# Patient Record
Sex: Female | Born: 1950
Health system: Southern US, Community
[De-identification: ages and names within clinical notes are randomized; demographics above are authoritative.]

## PROBLEM LIST (undated history)

## (undated) DIAGNOSIS — M1990 Unspecified osteoarthritis, unspecified site: Secondary | ICD-10-CM

## (undated) DIAGNOSIS — R112 Nausea with vomiting, unspecified: Secondary | ICD-10-CM

## (undated) DIAGNOSIS — Z87442 Personal history of urinary calculi: Secondary | ICD-10-CM

## (undated) DIAGNOSIS — E669 Obesity, unspecified: Secondary | ICD-10-CM

## (undated) DIAGNOSIS — R7303 Prediabetes: Secondary | ICD-10-CM

## (undated) DIAGNOSIS — F341 Dysthymic disorder: Secondary | ICD-10-CM

## (undated) DIAGNOSIS — E041 Nontoxic single thyroid nodule: Secondary | ICD-10-CM

## (undated) DIAGNOSIS — E559 Vitamin D deficiency, unspecified: Secondary | ICD-10-CM

## (undated) DIAGNOSIS — J310 Chronic rhinitis: Secondary | ICD-10-CM

## (undated) DIAGNOSIS — D509 Iron deficiency anemia, unspecified: Secondary | ICD-10-CM

## (undated) DIAGNOSIS — F32A Depression, unspecified: Secondary | ICD-10-CM

## (undated) DIAGNOSIS — M8580 Other specified disorders of bone density and structure, unspecified site: Secondary | ICD-10-CM

## (undated) DIAGNOSIS — R739 Hyperglycemia, unspecified: Secondary | ICD-10-CM

## (undated) DIAGNOSIS — E785 Hyperlipidemia, unspecified: Secondary | ICD-10-CM

## (undated) DIAGNOSIS — Z9889 Other specified postprocedural states: Secondary | ICD-10-CM

## (undated) DIAGNOSIS — F419 Anxiety disorder, unspecified: Secondary | ICD-10-CM

## (undated) DIAGNOSIS — E7800 Pure hypercholesterolemia, unspecified: Secondary | ICD-10-CM

## (undated) HISTORY — PX: BACK SURGERY: SHX140

## (undated) HISTORY — DX: Nontoxic single thyroid nodule: E04.1

## (undated) HISTORY — PX: OTHER SURGICAL HISTORY: SHX169

## (undated) HISTORY — DX: Prediabetes: R73.03

## (undated) HISTORY — PX: BREAST EXCISIONAL BIOPSY: SUR124

## (undated) HISTORY — PX: ABDOMINAL HYSTERECTOMY: SHX81

## (undated) HISTORY — PX: COLONOSCOPY: SHX174

## (undated) HISTORY — DX: Other forms of dyspnea: R06.09

## (undated) HISTORY — DX: Iron deficiency anemia, unspecified: D50.9

## (undated) HISTORY — PX: EYE SURGERY: SHX253

## (undated) HISTORY — DX: Chronic rhinitis: J31.0

## (undated) HISTORY — DX: Obesity, unspecified: E66.9

## (undated) HISTORY — DX: Hyperglycemia, unspecified: R73.9

## (undated) HISTORY — DX: Vitamin D deficiency, unspecified: E55.9

## (undated) HISTORY — PX: APPENDECTOMY: SHX54

## (undated) HISTORY — DX: Essential (primary) hypertension: I10

## (undated) HISTORY — DX: Dysthymic disorder: F34.1

## (undated) HISTORY — DX: Unspecified osteoarthritis, unspecified site: M19.90

## (undated) HISTORY — PX: CARPAL TUNNEL RELEASE: SHX101

## (undated) HISTORY — DX: Pure hypercholesterolemia, unspecified: E78.00

---

## 1997-08-07 ENCOUNTER — Encounter: Admission: RE | Admit: 1997-08-07 | Discharge: 1997-08-07 | Payer: Self-pay | Admitting: Family Medicine

## 1997-09-15 ENCOUNTER — Encounter: Admission: RE | Admit: 1997-09-15 | Discharge: 1997-09-15 | Payer: Self-pay | Admitting: Family Medicine

## 1997-10-26 ENCOUNTER — Encounter: Admission: RE | Admit: 1997-10-26 | Discharge: 1997-10-26 | Payer: Self-pay | Admitting: Family Medicine

## 1997-10-31 ENCOUNTER — Encounter: Admission: RE | Admit: 1997-10-31 | Discharge: 1997-10-31 | Payer: Self-pay | Admitting: Family Medicine

## 1997-11-23 ENCOUNTER — Encounter: Admission: RE | Admit: 1997-11-23 | Discharge: 1997-11-23 | Payer: Self-pay | Admitting: Family Medicine

## 1997-12-25 ENCOUNTER — Encounter: Admission: RE | Admit: 1997-12-25 | Discharge: 1997-12-25 | Payer: Self-pay | Admitting: Family Medicine

## 1998-01-11 ENCOUNTER — Encounter: Admission: RE | Admit: 1998-01-11 | Discharge: 1998-01-11 | Payer: Self-pay | Admitting: Family Medicine

## 1998-02-14 ENCOUNTER — Encounter: Admission: RE | Admit: 1998-02-14 | Discharge: 1998-02-14 | Payer: Self-pay | Admitting: Family Medicine

## 1998-03-13 ENCOUNTER — Encounter: Admission: RE | Admit: 1998-03-13 | Discharge: 1998-03-13 | Payer: Self-pay | Admitting: Family Medicine

## 1998-03-22 ENCOUNTER — Encounter: Admission: RE | Admit: 1998-03-22 | Discharge: 1998-03-22 | Payer: Self-pay | Admitting: Family Medicine

## 1998-04-06 ENCOUNTER — Encounter: Admission: RE | Admit: 1998-04-06 | Discharge: 1998-04-06 | Payer: Self-pay | Admitting: Family Medicine

## 1998-04-10 ENCOUNTER — Emergency Department (HOSPITAL_COMMUNITY): Admission: EM | Admit: 1998-04-10 | Discharge: 1998-04-10 | Payer: Self-pay | Admitting: Emergency Medicine

## 1998-04-10 ENCOUNTER — Encounter: Payer: Self-pay | Admitting: Emergency Medicine

## 1998-05-08 ENCOUNTER — Encounter: Admission: RE | Admit: 1998-05-08 | Discharge: 1998-05-08 | Payer: Self-pay | Admitting: Sports Medicine

## 1998-05-15 ENCOUNTER — Encounter: Payer: Self-pay | Admitting: Neurosurgery

## 1998-05-15 ENCOUNTER — Observation Stay (HOSPITAL_COMMUNITY): Admission: RE | Admit: 1998-05-15 | Discharge: 1998-05-16 | Payer: Self-pay | Admitting: Neurosurgery

## 1998-06-06 ENCOUNTER — Encounter: Payer: Self-pay | Admitting: Neurosurgery

## 1998-06-06 ENCOUNTER — Ambulatory Visit (HOSPITAL_COMMUNITY): Admission: RE | Admit: 1998-06-06 | Discharge: 1998-06-06 | Payer: Self-pay | Admitting: Neurosurgery

## 1998-06-18 ENCOUNTER — Encounter: Admission: RE | Admit: 1998-06-18 | Discharge: 1998-06-18 | Payer: Self-pay | Admitting: Sports Medicine

## 1998-07-02 ENCOUNTER — Encounter: Payer: Self-pay | Admitting: Neurosurgery

## 1998-07-02 ENCOUNTER — Ambulatory Visit (HOSPITAL_COMMUNITY): Admission: RE | Admit: 1998-07-02 | Discharge: 1998-07-02 | Payer: Self-pay | Admitting: Neurosurgery

## 1998-07-20 ENCOUNTER — Encounter: Admission: RE | Admit: 1998-07-20 | Discharge: 1998-07-20 | Payer: Self-pay | Admitting: Family Medicine

## 1998-08-28 ENCOUNTER — Encounter: Admission: RE | Admit: 1998-08-28 | Discharge: 1998-08-28 | Payer: Self-pay | Admitting: Sports Medicine

## 1998-10-12 ENCOUNTER — Encounter: Admission: RE | Admit: 1998-10-12 | Discharge: 1998-10-12 | Payer: Self-pay | Admitting: Family Medicine

## 1998-11-15 ENCOUNTER — Encounter: Admission: RE | Admit: 1998-11-15 | Discharge: 1998-11-15 | Payer: Self-pay | Admitting: Family Medicine

## 1998-12-17 ENCOUNTER — Encounter: Admission: RE | Admit: 1998-12-17 | Discharge: 1998-12-17 | Payer: Self-pay | Admitting: Family Medicine

## 1999-01-22 ENCOUNTER — Encounter: Admission: RE | Admit: 1999-01-22 | Discharge: 1999-01-22 | Payer: Self-pay | Admitting: Sports Medicine

## 1999-02-15 ENCOUNTER — Encounter: Admission: RE | Admit: 1999-02-15 | Discharge: 1999-02-15 | Payer: Self-pay | Admitting: Family Medicine

## 1999-03-05 ENCOUNTER — Encounter: Admission: RE | Admit: 1999-03-05 | Discharge: 1999-03-05 | Payer: Self-pay | Admitting: Sports Medicine

## 1999-04-02 ENCOUNTER — Encounter: Admission: RE | Admit: 1999-04-02 | Discharge: 1999-04-02 | Payer: Self-pay | Admitting: Sports Medicine

## 1999-04-23 ENCOUNTER — Encounter: Admission: RE | Admit: 1999-04-23 | Discharge: 1999-04-23 | Payer: Self-pay | Admitting: Sports Medicine

## 1999-05-24 ENCOUNTER — Encounter: Admission: RE | Admit: 1999-05-24 | Discharge: 1999-05-24 | Payer: Self-pay | Admitting: Family Medicine

## 1999-06-06 ENCOUNTER — Encounter: Admission: RE | Admit: 1999-06-06 | Discharge: 1999-06-06 | Payer: Self-pay | Admitting: Family Medicine

## 1999-06-28 ENCOUNTER — Encounter: Admission: RE | Admit: 1999-06-28 | Discharge: 1999-06-28 | Payer: Self-pay | Admitting: Family Medicine

## 1999-07-11 ENCOUNTER — Encounter: Admission: RE | Admit: 1999-07-11 | Discharge: 1999-07-11 | Payer: Self-pay | Admitting: Family Medicine

## 1999-08-06 ENCOUNTER — Ambulatory Visit (HOSPITAL_COMMUNITY): Admission: RE | Admit: 1999-08-06 | Discharge: 1999-08-06 | Payer: Self-pay | Admitting: Family Medicine

## 1999-08-06 ENCOUNTER — Encounter: Admission: RE | Admit: 1999-08-06 | Discharge: 1999-08-06 | Payer: Self-pay | Admitting: Sports Medicine

## 1999-08-20 ENCOUNTER — Encounter: Admission: RE | Admit: 1999-08-20 | Discharge: 1999-08-20 | Payer: Self-pay | Admitting: Family Medicine

## 1999-09-05 ENCOUNTER — Encounter: Admission: RE | Admit: 1999-09-05 | Discharge: 1999-09-05 | Payer: Self-pay | Admitting: Family Medicine

## 1999-09-18 ENCOUNTER — Encounter: Admission: RE | Admit: 1999-09-18 | Discharge: 1999-09-18 | Payer: Self-pay | Admitting: Family Medicine

## 1999-10-02 ENCOUNTER — Encounter: Admission: RE | Admit: 1999-10-02 | Discharge: 1999-10-02 | Payer: Self-pay | Admitting: Family Medicine

## 1999-10-24 ENCOUNTER — Encounter: Admission: RE | Admit: 1999-10-24 | Discharge: 1999-10-24 | Payer: Self-pay | Admitting: Family Medicine

## 1999-11-12 ENCOUNTER — Encounter: Admission: RE | Admit: 1999-11-12 | Discharge: 1999-11-12 | Payer: Self-pay | Admitting: Family Medicine

## 1999-11-14 ENCOUNTER — Encounter: Payer: Self-pay | Admitting: Family Medicine

## 1999-11-14 ENCOUNTER — Encounter: Admission: RE | Admit: 1999-11-14 | Discharge: 1999-11-14 | Payer: Self-pay | Admitting: *Deleted

## 1999-11-21 ENCOUNTER — Encounter: Admission: RE | Admit: 1999-11-21 | Discharge: 1999-11-21 | Payer: Self-pay | Admitting: Family Medicine

## 1999-12-04 ENCOUNTER — Encounter: Admission: RE | Admit: 1999-12-04 | Discharge: 1999-12-04 | Payer: Self-pay | Admitting: *Deleted

## 1999-12-04 ENCOUNTER — Encounter: Payer: Self-pay | Admitting: Family Medicine

## 1999-12-17 ENCOUNTER — Encounter: Admission: RE | Admit: 1999-12-17 | Discharge: 1999-12-17 | Payer: Self-pay | Admitting: Family Medicine

## 2000-01-03 ENCOUNTER — Encounter: Admission: RE | Admit: 2000-01-03 | Discharge: 2000-01-03 | Payer: Self-pay | Admitting: Family Medicine

## 2000-01-27 ENCOUNTER — Encounter: Admission: RE | Admit: 2000-01-27 | Discharge: 2000-01-27 | Payer: Self-pay | Admitting: Family Medicine

## 2000-02-26 ENCOUNTER — Encounter: Admission: RE | Admit: 2000-02-26 | Discharge: 2000-02-26 | Payer: Self-pay | Admitting: Family Medicine

## 2000-03-25 ENCOUNTER — Encounter: Admission: RE | Admit: 2000-03-25 | Discharge: 2000-03-25 | Payer: Self-pay | Admitting: Family Medicine

## 2000-04-24 ENCOUNTER — Encounter: Admission: RE | Admit: 2000-04-24 | Discharge: 2000-04-24 | Payer: Self-pay | Admitting: Family Medicine

## 2000-08-24 ENCOUNTER — Encounter: Admission: RE | Admit: 2000-08-24 | Discharge: 2000-08-24 | Payer: Self-pay | Admitting: Family Medicine

## 2000-09-30 ENCOUNTER — Encounter: Admission: RE | Admit: 2000-09-30 | Discharge: 2000-09-30 | Payer: Self-pay | Admitting: Family Medicine

## 2000-11-02 ENCOUNTER — Encounter: Admission: RE | Admit: 2000-11-02 | Discharge: 2000-11-02 | Payer: Self-pay | Admitting: Family Medicine

## 2000-12-03 ENCOUNTER — Encounter: Admission: RE | Admit: 2000-12-03 | Discharge: 2000-12-03 | Payer: Self-pay | Admitting: Family Medicine

## 2001-04-16 ENCOUNTER — Encounter: Admission: RE | Admit: 2001-04-16 | Discharge: 2001-04-16 | Payer: Self-pay | Admitting: Family Medicine

## 2001-05-07 ENCOUNTER — Encounter: Admission: RE | Admit: 2001-05-07 | Discharge: 2001-05-07 | Payer: Self-pay | Admitting: Family Medicine

## 2001-05-12 ENCOUNTER — Encounter: Admission: RE | Admit: 2001-05-12 | Discharge: 2001-05-12 | Payer: Self-pay | Admitting: Sports Medicine

## 2001-05-12 ENCOUNTER — Encounter: Payer: Self-pay | Admitting: Sports Medicine

## 2001-05-21 ENCOUNTER — Encounter: Admission: RE | Admit: 2001-05-21 | Discharge: 2001-05-21 | Payer: Self-pay | Admitting: Family Medicine

## 2001-12-07 ENCOUNTER — Encounter: Admission: RE | Admit: 2001-12-07 | Discharge: 2001-12-07 | Payer: Self-pay | Admitting: Family Medicine

## 2002-01-06 ENCOUNTER — Encounter: Admission: RE | Admit: 2002-01-06 | Discharge: 2002-01-06 | Payer: Self-pay | Admitting: Family Medicine

## 2002-02-15 ENCOUNTER — Encounter: Admission: RE | Admit: 2002-02-15 | Discharge: 2002-02-15 | Payer: Self-pay | Admitting: Family Medicine

## 2002-07-04 ENCOUNTER — Encounter: Admission: RE | Admit: 2002-07-04 | Discharge: 2002-07-04 | Payer: Self-pay | Admitting: Family Medicine

## 2002-07-06 ENCOUNTER — Encounter: Payer: Self-pay | Admitting: *Deleted

## 2002-07-06 ENCOUNTER — Ambulatory Visit (HOSPITAL_COMMUNITY): Admission: RE | Admit: 2002-07-06 | Discharge: 2002-07-06 | Payer: Self-pay | Admitting: *Deleted

## 2002-08-01 ENCOUNTER — Encounter: Admission: RE | Admit: 2002-08-01 | Discharge: 2002-08-01 | Payer: Self-pay | Admitting: Sports Medicine

## 2002-08-01 ENCOUNTER — Encounter: Payer: Self-pay | Admitting: Sports Medicine

## 2002-08-17 ENCOUNTER — Encounter: Admission: RE | Admit: 2002-08-17 | Discharge: 2002-08-17 | Payer: Self-pay | Admitting: Neurosurgery

## 2002-08-17 ENCOUNTER — Encounter: Payer: Self-pay | Admitting: Neurosurgery

## 2002-09-02 ENCOUNTER — Encounter: Payer: Self-pay | Admitting: Neurosurgery

## 2002-09-02 ENCOUNTER — Encounter: Admission: RE | Admit: 2002-09-02 | Discharge: 2002-09-02 | Payer: Self-pay | Admitting: Neurosurgery

## 2002-12-19 ENCOUNTER — Encounter: Admission: RE | Admit: 2002-12-19 | Discharge: 2002-12-19 | Payer: Self-pay | Admitting: Sports Medicine

## 2003-04-06 ENCOUNTER — Other Ambulatory Visit: Admission: RE | Admit: 2003-04-06 | Discharge: 2003-04-06 | Payer: Self-pay | Admitting: Obstetrics and Gynecology

## 2003-10-05 ENCOUNTER — Encounter: Admission: RE | Admit: 2003-10-05 | Discharge: 2003-10-05 | Payer: Self-pay | Admitting: Family Medicine

## 2004-01-22 ENCOUNTER — Encounter: Admission: RE | Admit: 2004-01-22 | Discharge: 2004-01-22 | Payer: Self-pay | Admitting: Sports Medicine

## 2004-02-09 ENCOUNTER — Ambulatory Visit: Payer: Self-pay | Admitting: Family Medicine

## 2004-04-11 ENCOUNTER — Ambulatory Visit: Payer: Self-pay | Admitting: Family Medicine

## 2004-04-11 ENCOUNTER — Encounter: Admission: RE | Admit: 2004-04-11 | Discharge: 2004-04-11 | Payer: Self-pay | Admitting: Sports Medicine

## 2004-04-11 IMAGING — CR DG FOOT COMPLETE 3+V*R*
3 series · 3 of 3 positions shown · non-contrast
Comparison: none

CLINICAL DATA: Bump on top of feet, left greater than right. 
 LEFT FOOT:
 Three views of the left foot were obtained.  There is degenerative change, particularly at the articulation of the first cuneiform and first metatarsal.  There is slight loss of joint space with sclerosis and mild eburnation which creates a slight bony protrusion on the dorsal aspect of that level.  No other acute abnormality is seen.

[view not recorded (1 of 3)]
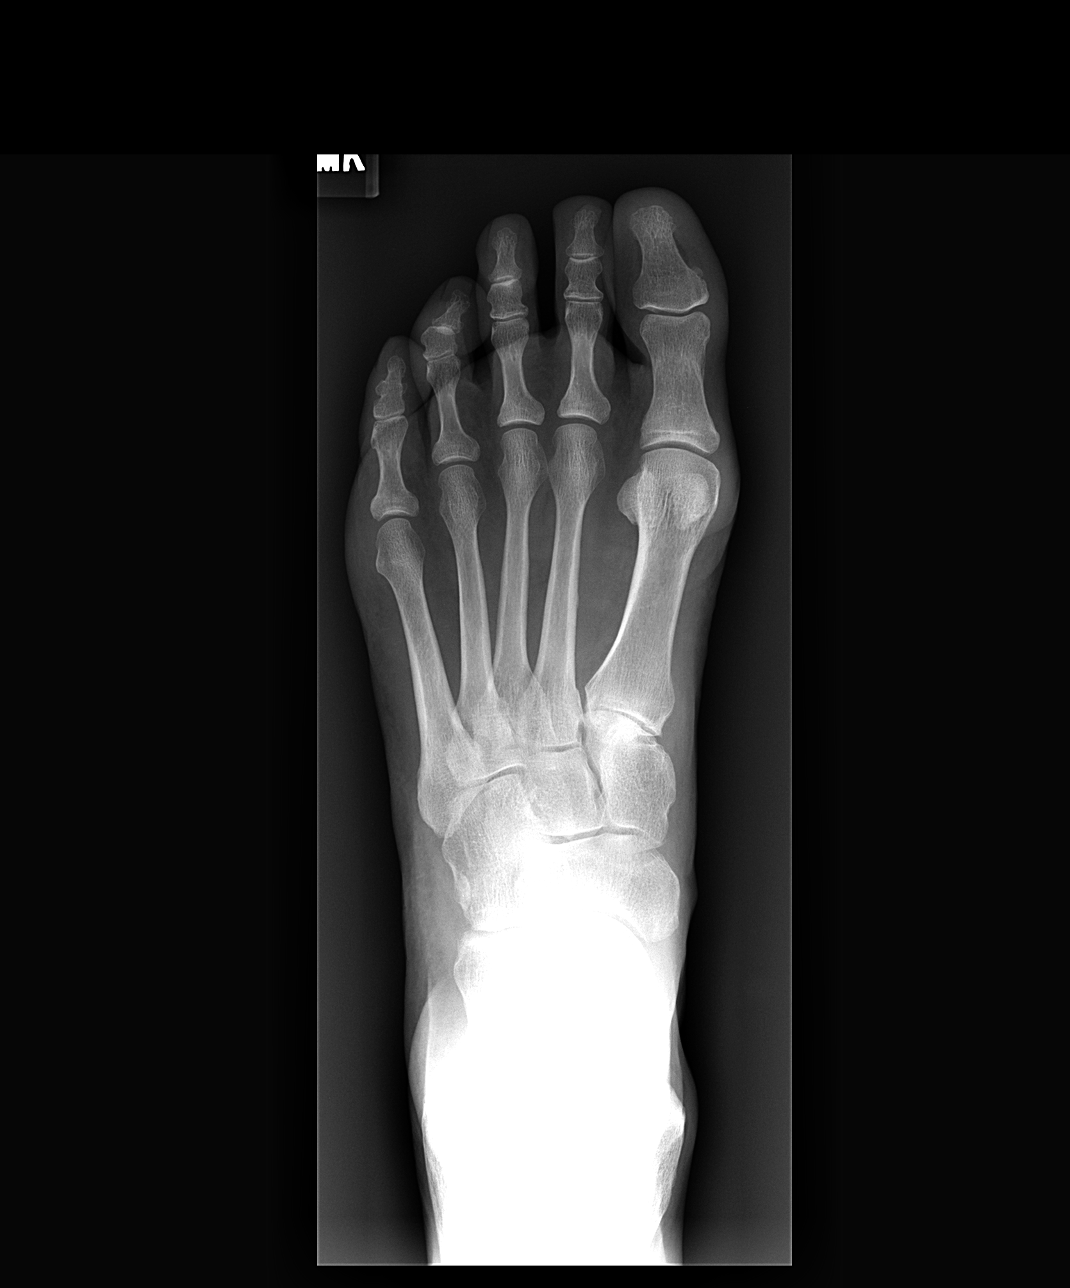

[view not recorded (2 of 3)]
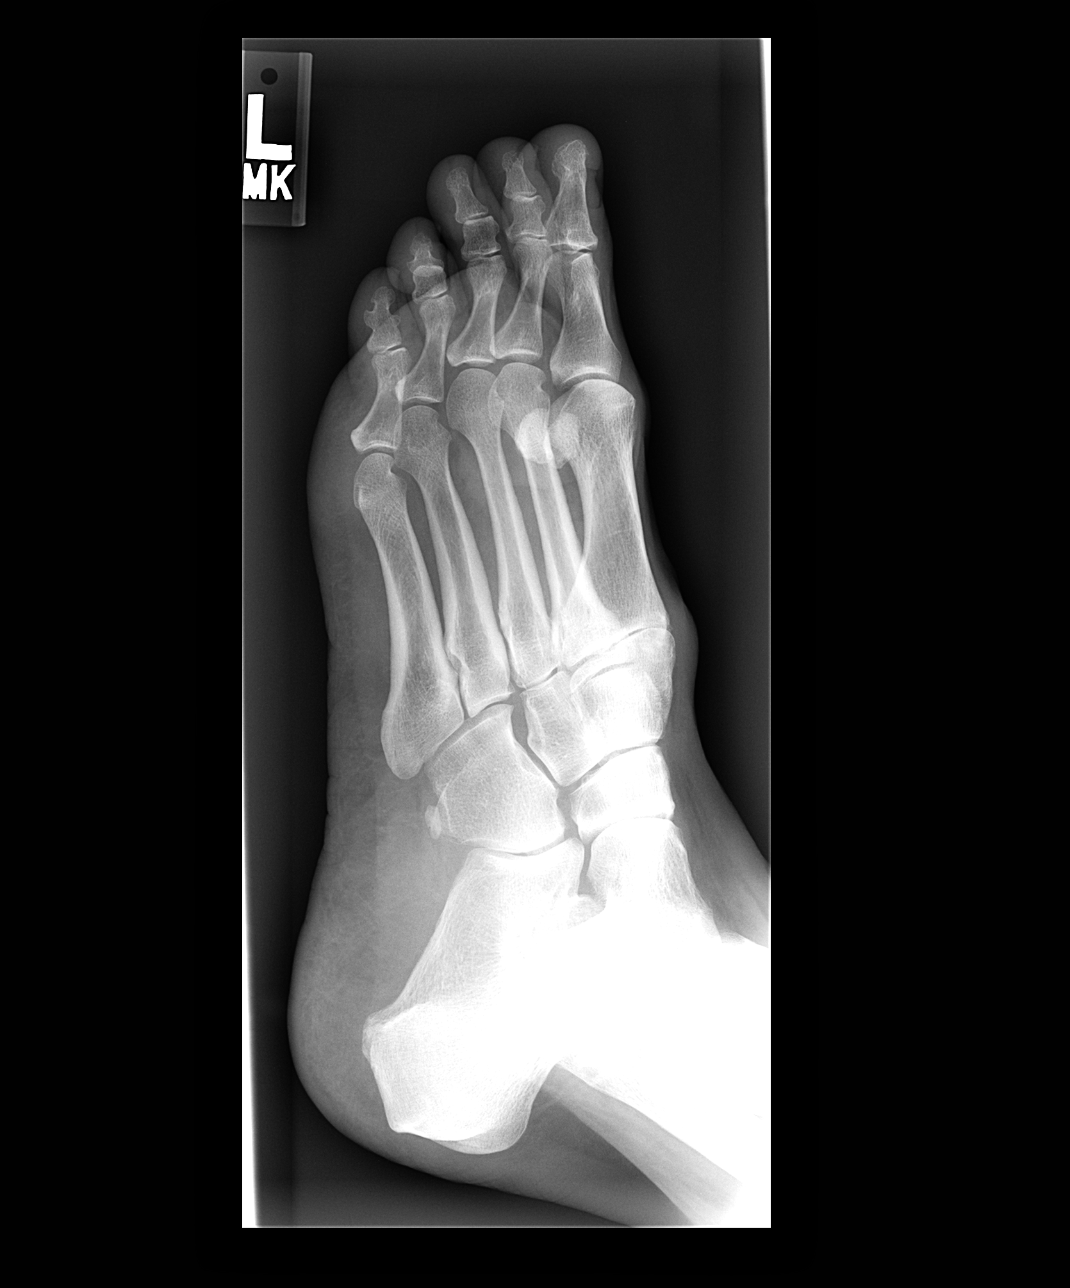

[view not recorded (3 of 3)]
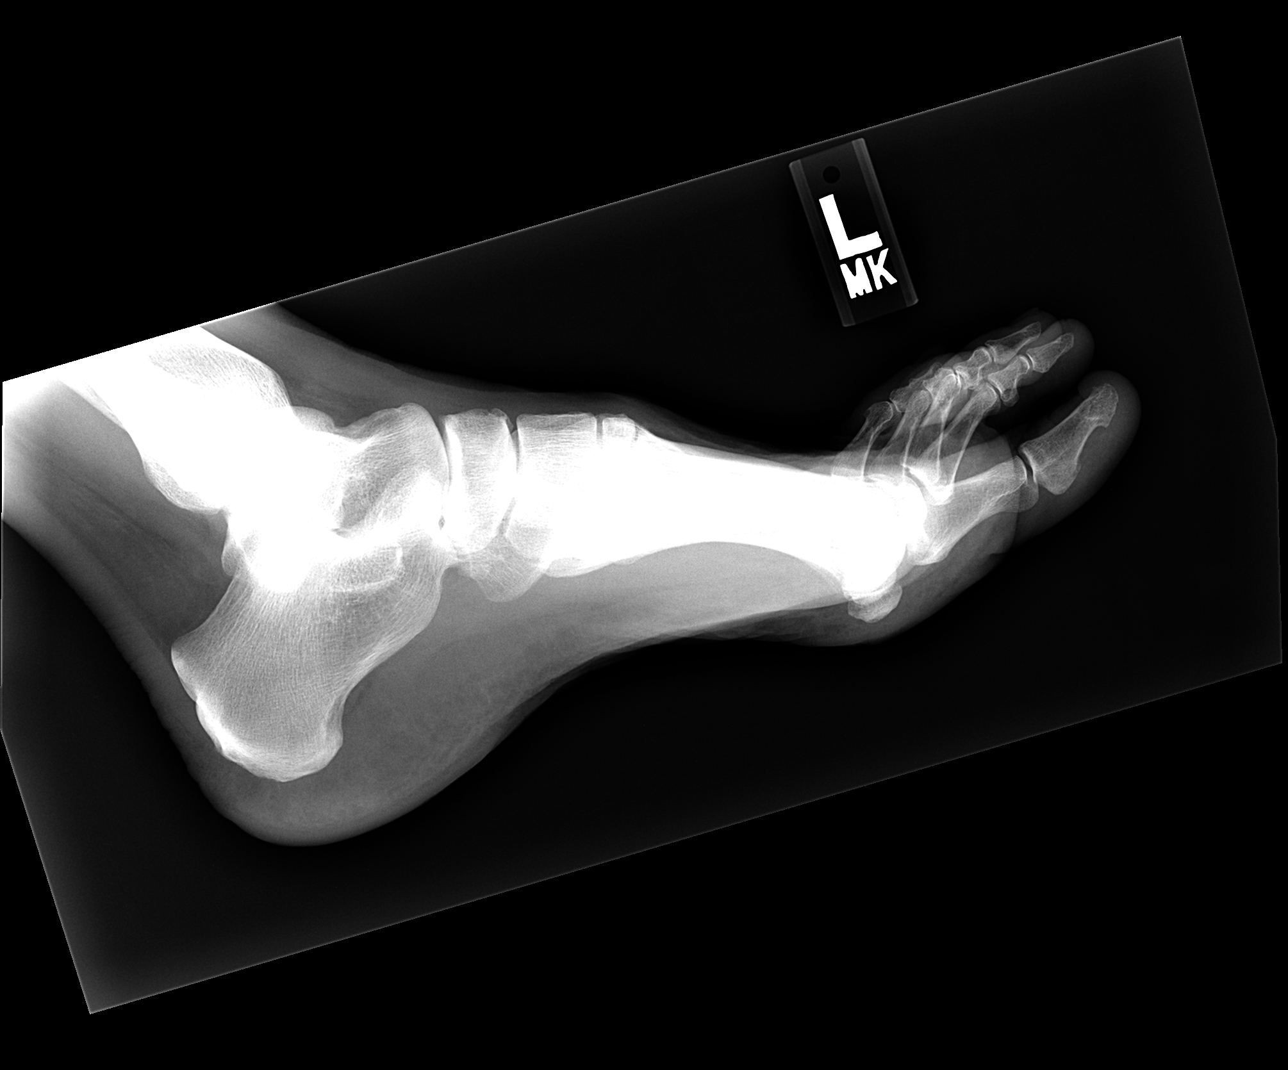

[3 of 3 positions shown; findings below may reference images not displayed]

IMPRESSION: The protrusion on the dorsal aspect of the left foot appears to be due to degenerative at the first cuneiform-first metatarsal articulation.
 RIGHT FOOT:
 Three views of the right foot show no acute abnormality.  There is less degenerative change at the articulation of the first cuneiform and first metatarsal than is seen on the left foot.
IMPRESSION: Negative right foot.  Only minimal degenerative change is present at the first cuneiform-first metatarsal articulation.

## 2004-04-11 IMAGING — CR DG FOOT COMPLETE 3+V*R*
3 series · 3 of 3 positions shown · non-contrast
Comparison: none

CLINICAL DATA: Bump on top of feet, left greater than right. 
 LEFT FOOT:
 Three views of the left foot were obtained.  There is degenerative change, particularly at the articulation of the first cuneiform and first metatarsal.  There is slight loss of joint space with sclerosis and mild eburnation which creates a slight bony protrusion on the dorsal aspect of that level.  No other acute abnormality is seen.

[view not recorded (1 of 3)]
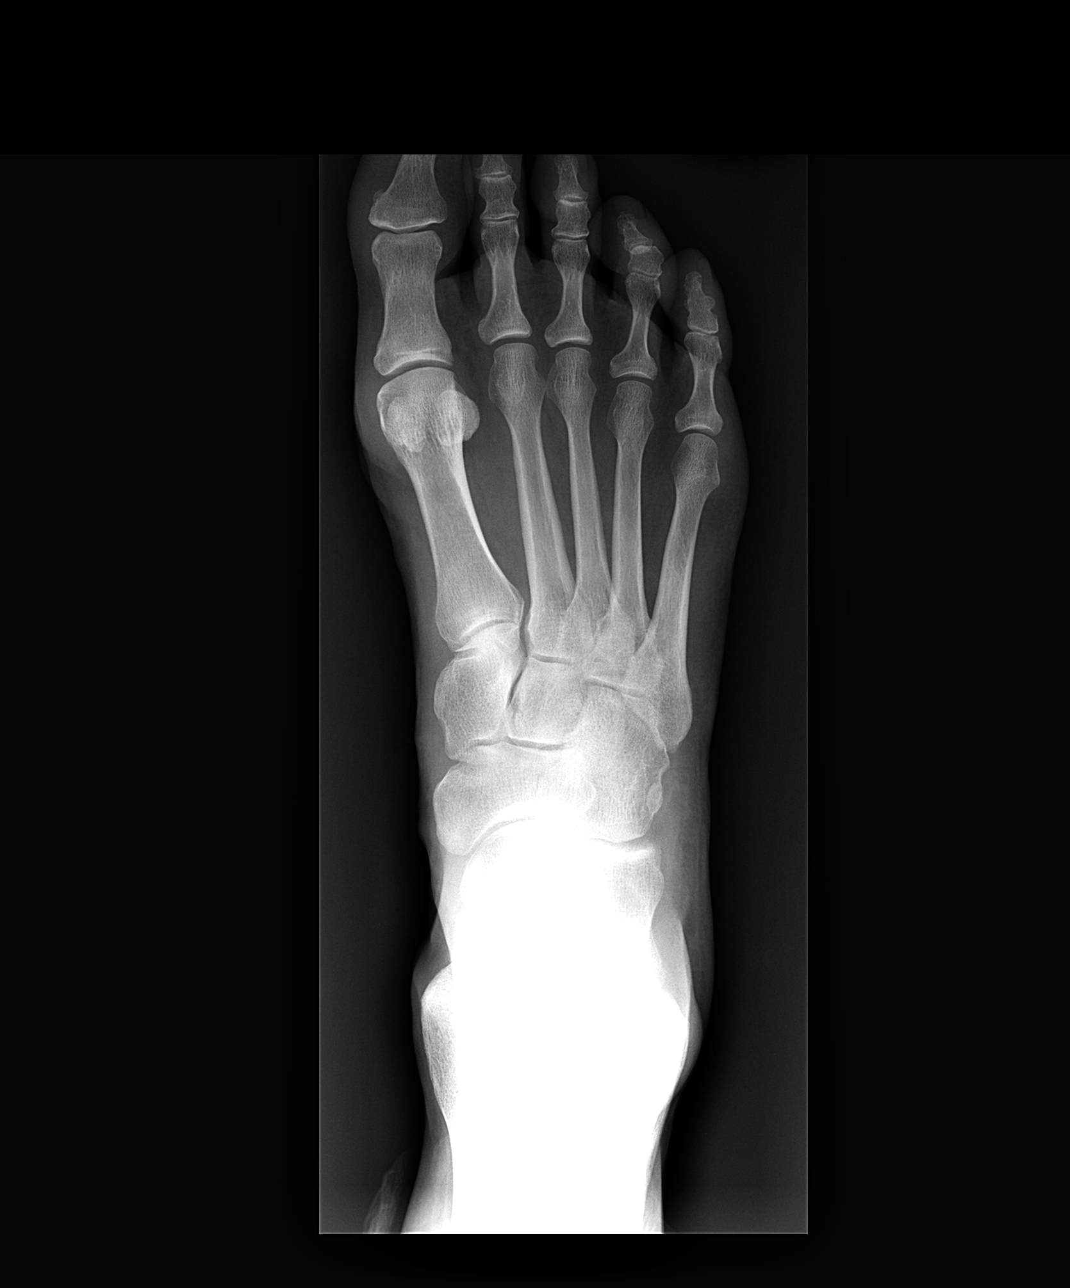

[view not recorded (2 of 3)]
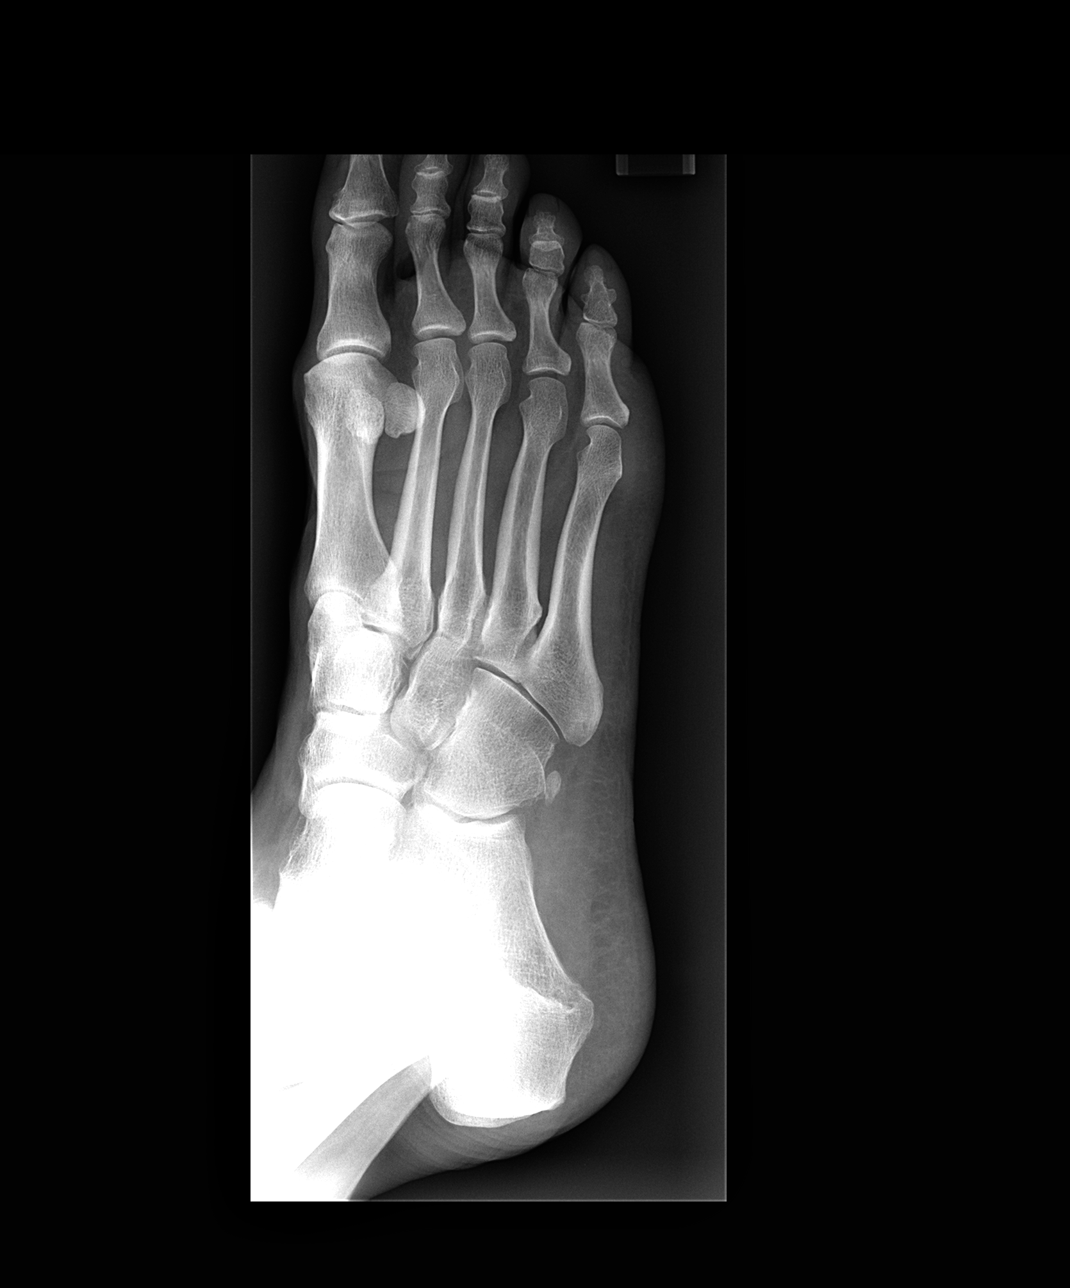

[view not recorded (3 of 3)]
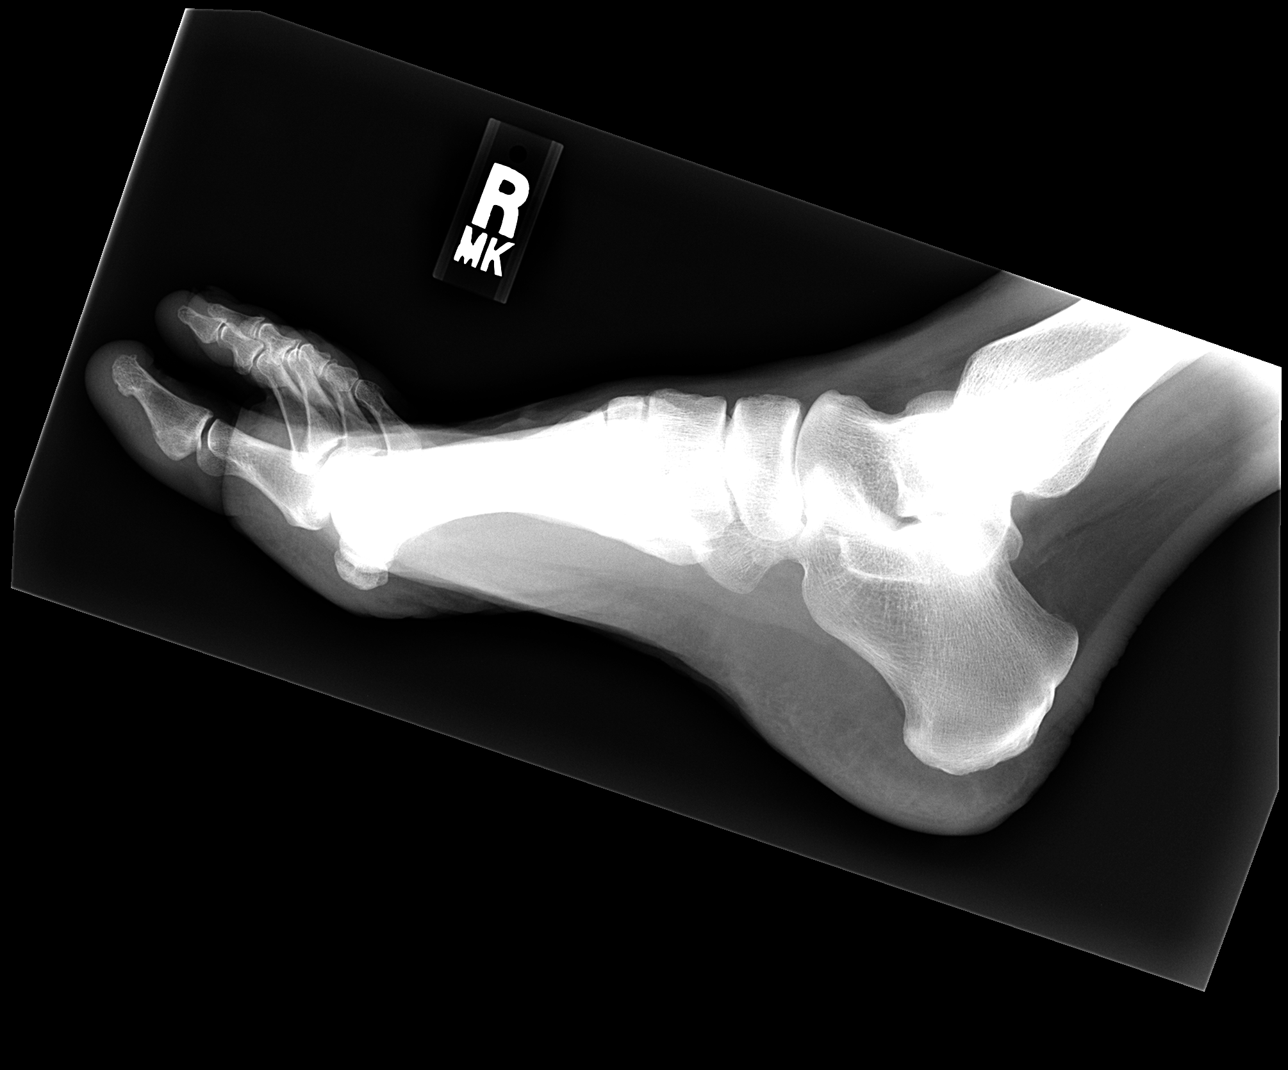

[3 of 3 positions shown; findings below may reference images not displayed]

IMPRESSION: The protrusion on the dorsal aspect of the left foot appears to be due to degenerative at the first cuneiform-first metatarsal articulation.
 RIGHT FOOT:
 Three views of the right foot show no acute abnormality.  There is less degenerative change at the articulation of the first cuneiform and first metatarsal than is seen on the left foot.
IMPRESSION: Negative right foot.  Only minimal degenerative change is present at the first cuneiform-first metatarsal articulation.

## 2004-05-01 ENCOUNTER — Encounter: Admission: RE | Admit: 2004-05-01 | Discharge: 2004-05-01 | Payer: Self-pay | Admitting: Obstetrics and Gynecology

## 2004-09-13 ENCOUNTER — Ambulatory Visit (HOSPITAL_COMMUNITY): Admission: RE | Admit: 2004-09-13 | Discharge: 2004-09-13 | Payer: Self-pay | Admitting: Neurosurgery

## 2004-10-03 ENCOUNTER — Ambulatory Visit (HOSPITAL_COMMUNITY): Admission: RE | Admit: 2004-10-03 | Discharge: 2004-10-03 | Payer: Self-pay | Admitting: Neurosurgery

## 2004-10-03 ENCOUNTER — Ambulatory Visit (HOSPITAL_BASED_OUTPATIENT_CLINIC_OR_DEPARTMENT_OTHER): Admission: RE | Admit: 2004-10-03 | Discharge: 2004-10-03 | Payer: Self-pay | Admitting: Neurosurgery

## 2004-10-24 ENCOUNTER — Ambulatory Visit: Payer: Self-pay | Admitting: Family Medicine

## 2004-10-25 ENCOUNTER — Encounter: Admission: RE | Admit: 2004-10-25 | Discharge: 2004-10-25 | Payer: Self-pay | Admitting: Sports Medicine

## 2004-10-25 IMAGING — CR DG HAND COMPLETE 3+V*L*
3 series · 3 of 3 positions shown · non-contrast
Comparison: none

CLINICAL DATA: Bilateral hand swelling especially PIP joints.  Recent carpal tunnel surgery. 
COMPLETE LEFT HAND THREE VIEWS: 
No significant osseous, articular is seen.  Slight soft tissue swelling is seen at the proximal digits and at the metacarpal phalangeal joint level.

[x hand pa left]
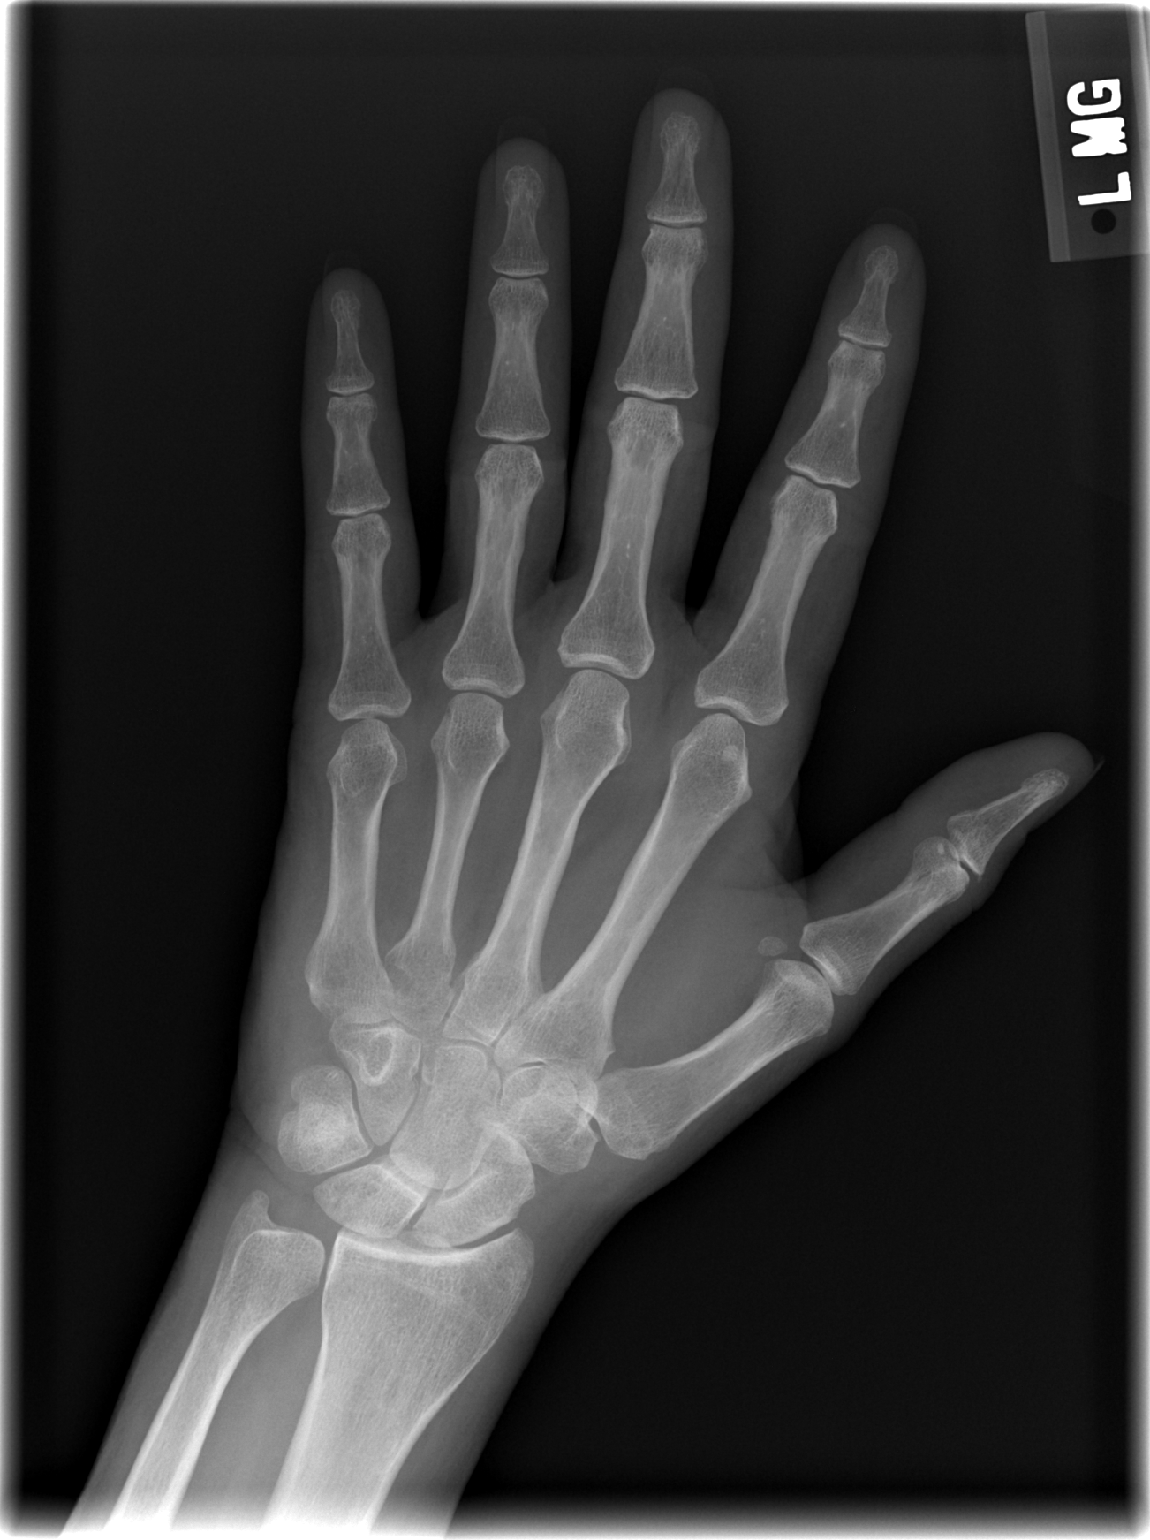

[x hand oblique left]
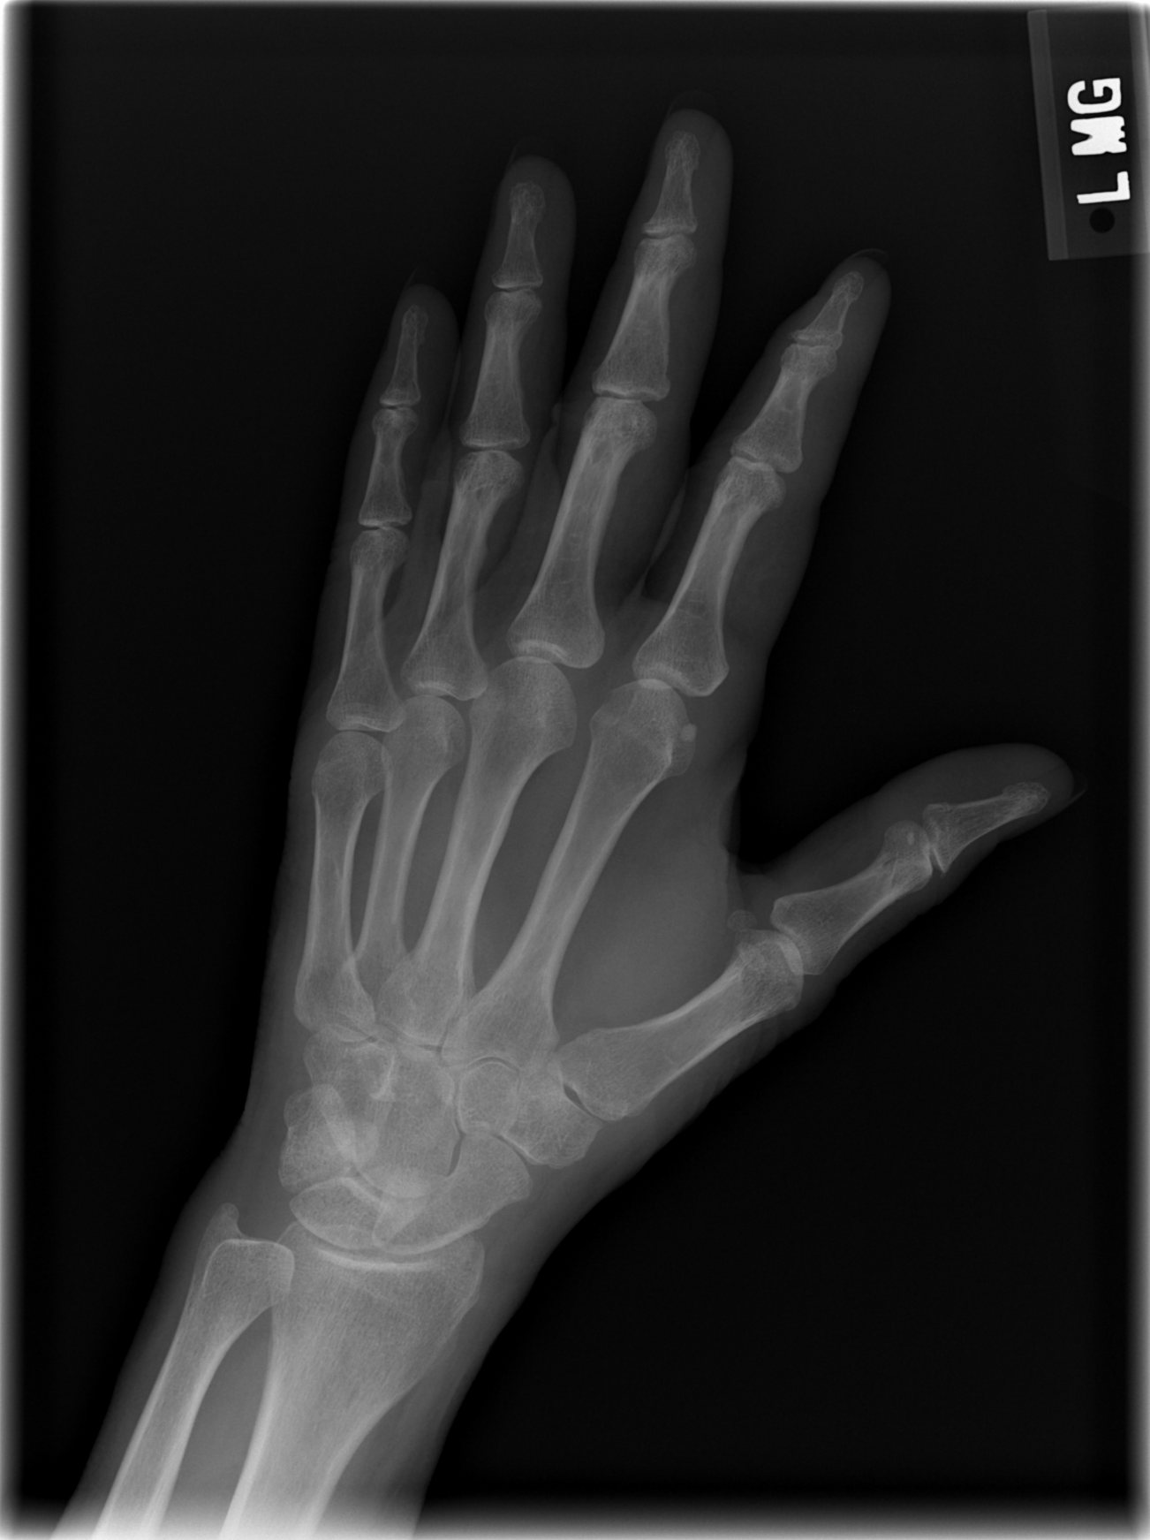

[x hand lat left]
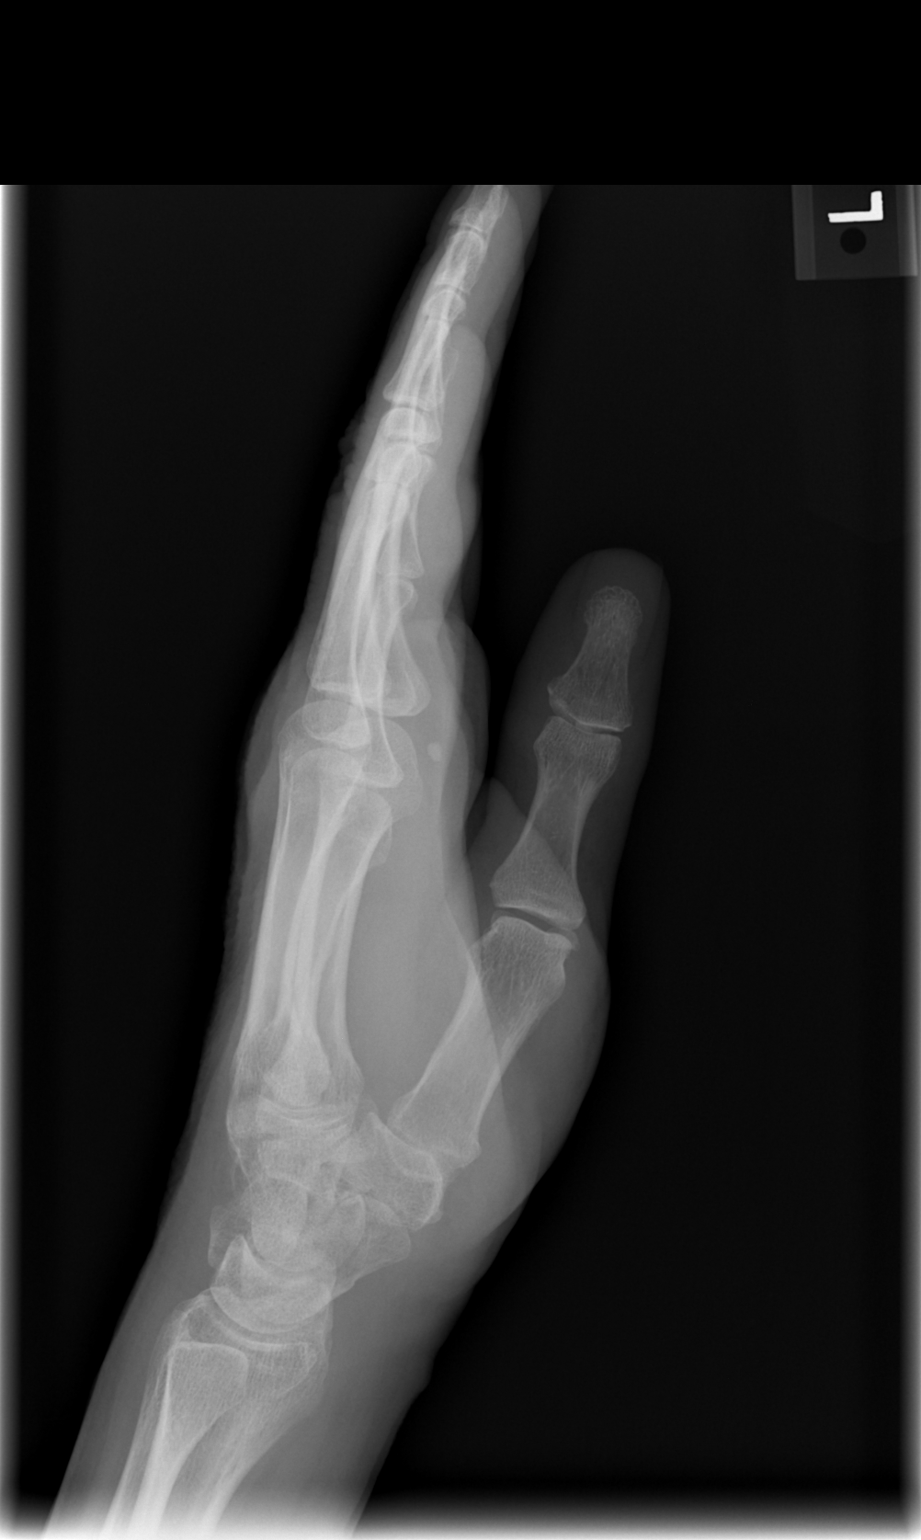

[3 of 3 positions shown; findings below may reference images not displayed]

IMPRESSION: 1.  Slight soft tissue swelling. 
2.  Otherwise negative. 
COMPLETE RIGHT HAND THREE VIEWS: 
Slight diffuse soft tissues swelling is seen especially at the proximal fingers and at the metacarpal phalangeal joint level.  No other significant osseous, nor articular abnormality is seen.
IMPRESSION: 1.  Slight soft tissue swelling. 
2.  Otherwise negative.

## 2004-10-25 IMAGING — CR DG HAND COMPLETE 3+V*R*
3 series · 3 of 3 positions shown · non-contrast
Comparison: none

CLINICAL DATA: Bilateral hand swelling especially PIP joints.  Recent carpal tunnel surgery. 
COMPLETE LEFT HAND THREE VIEWS: 
No significant osseous, articular is seen.  Slight soft tissue swelling is seen at the proximal digits and at the metacarpal phalangeal joint level.

[x hand pa right]
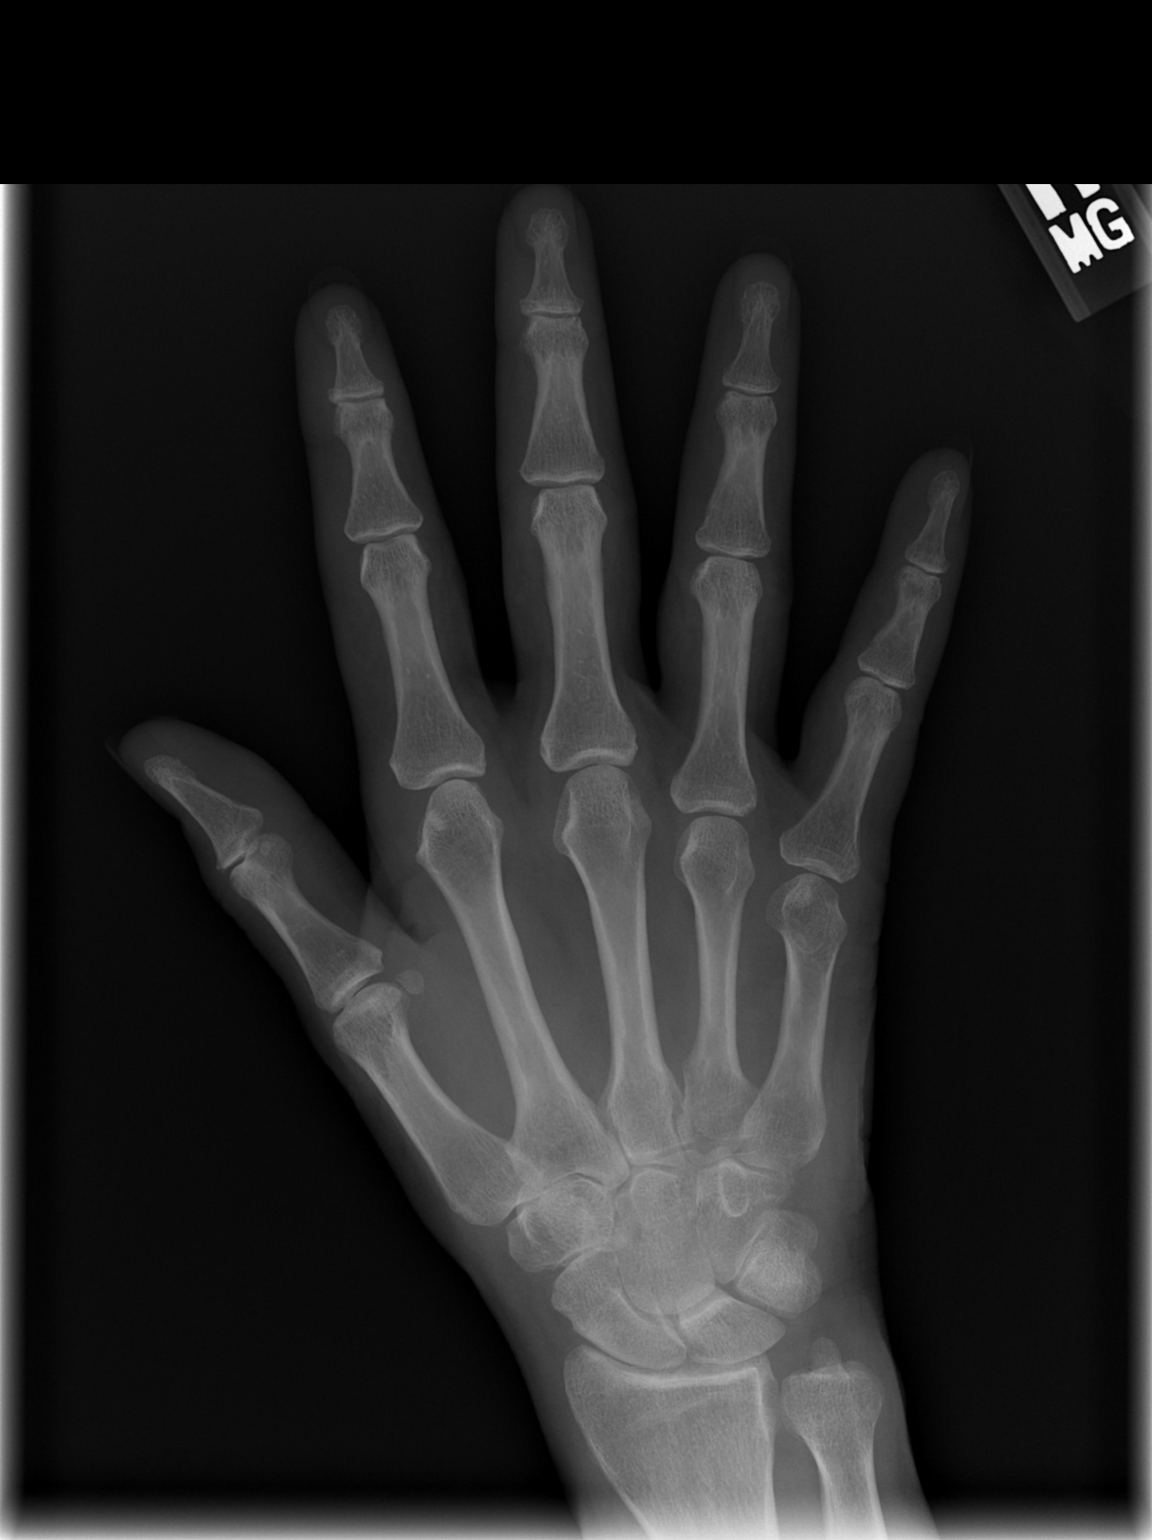

[x hand oblique right]
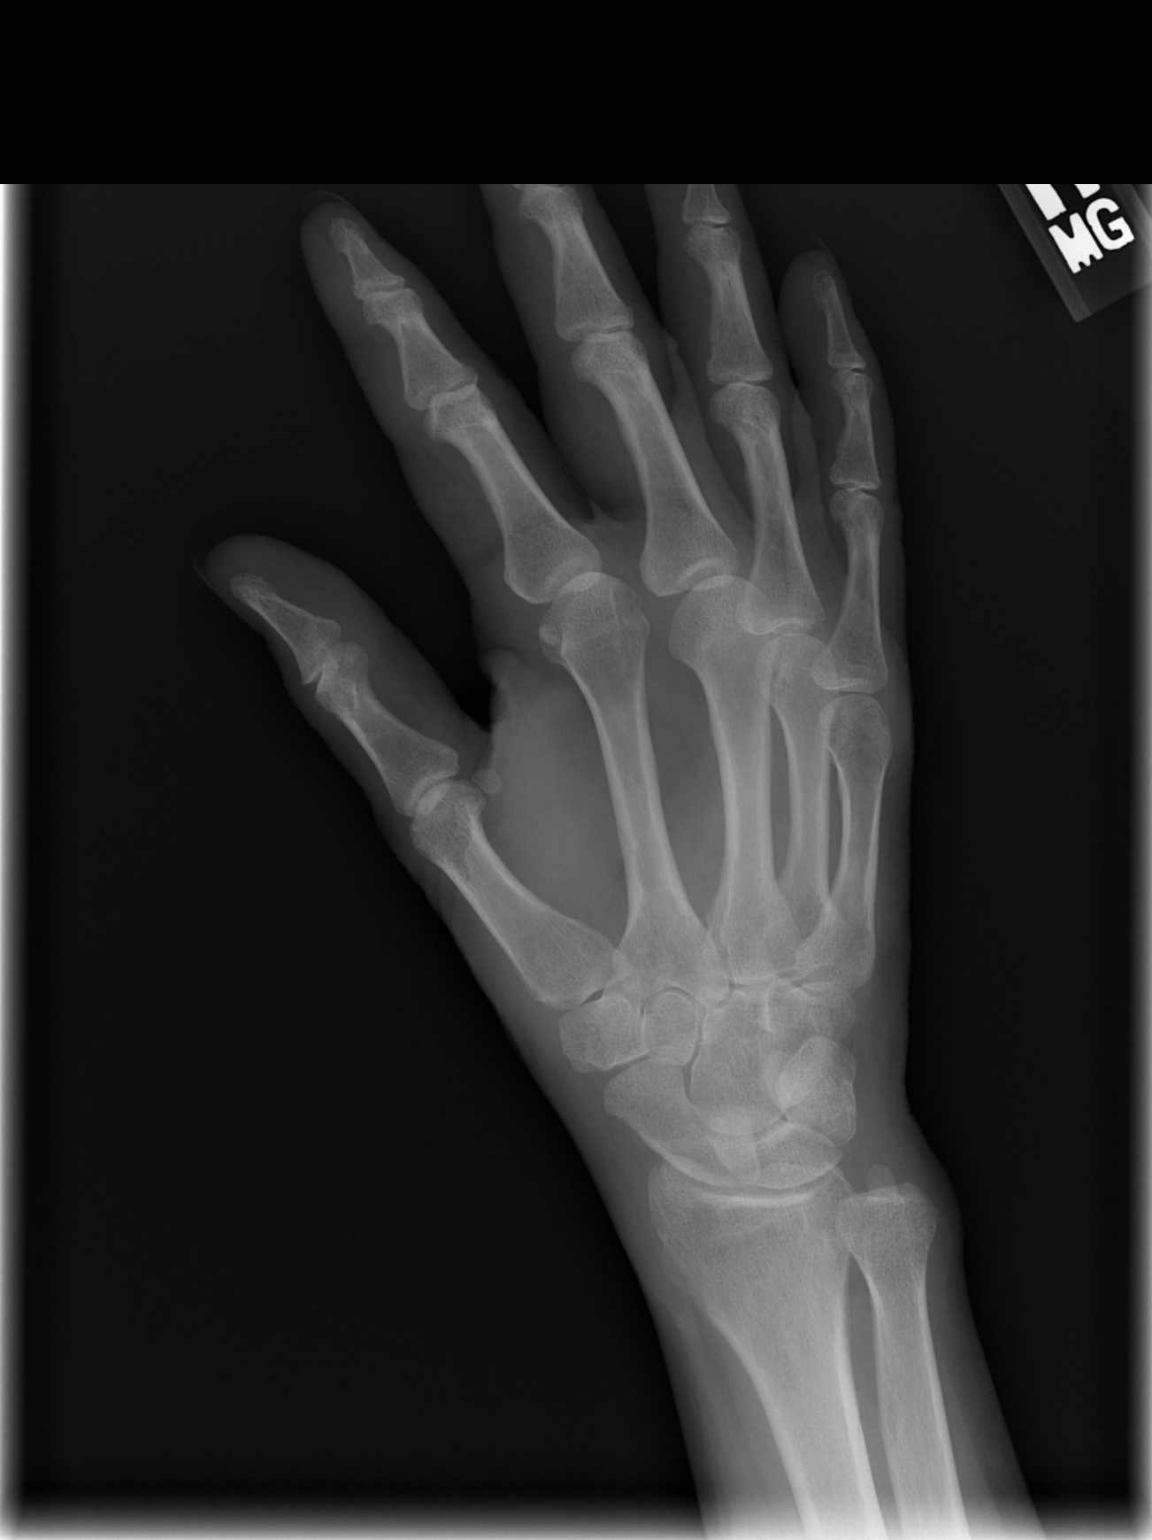

[x hand lat right]
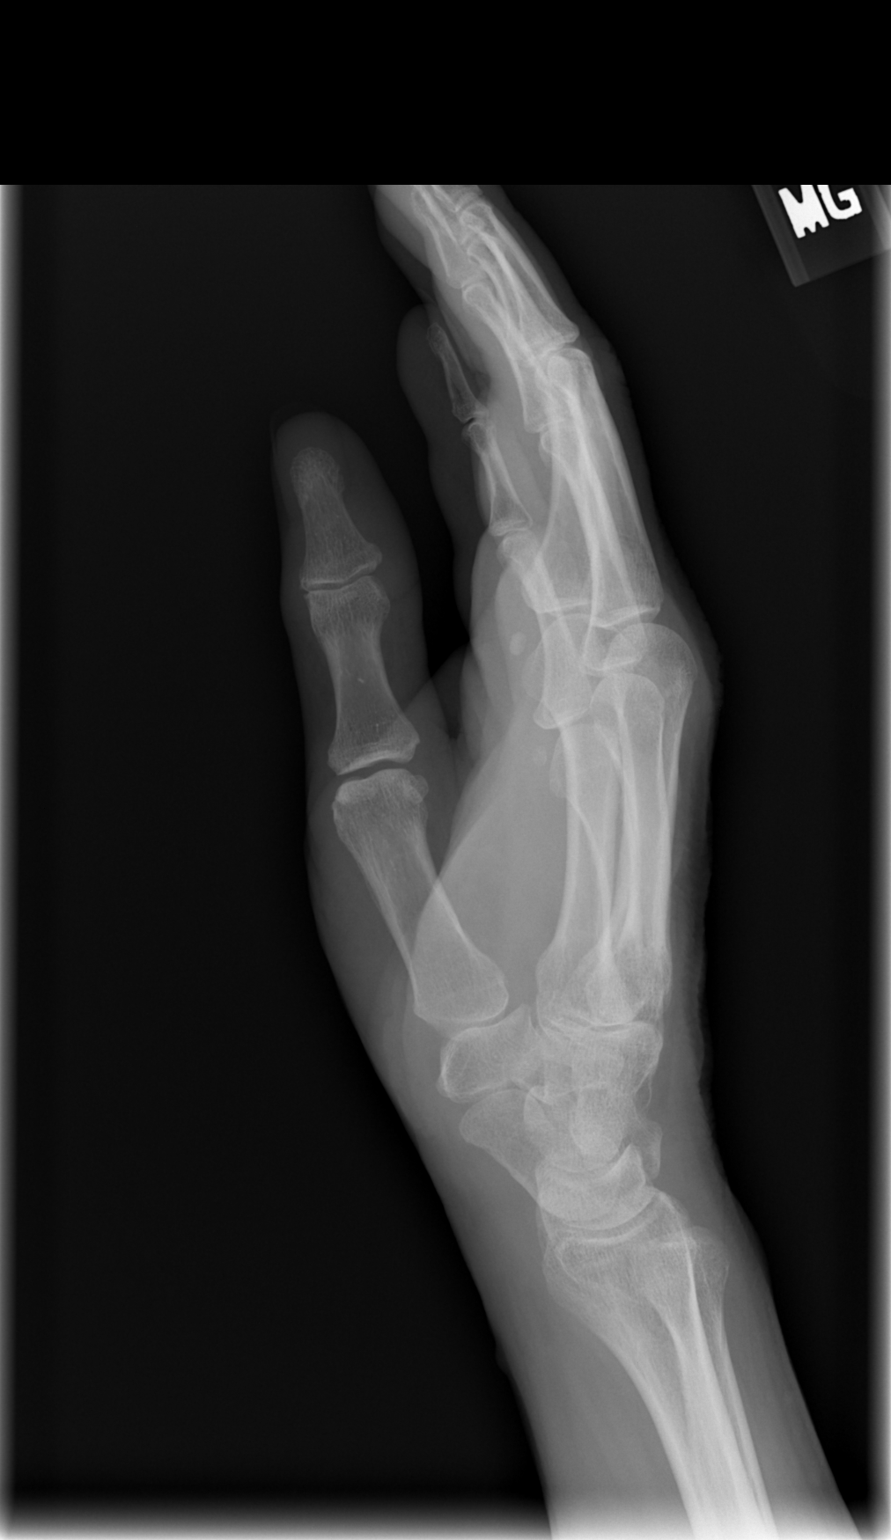

[3 of 3 positions shown; findings below may reference images not displayed]

IMPRESSION: 1.  Slight soft tissue swelling. 
2.  Otherwise negative. 
COMPLETE RIGHT HAND THREE VIEWS: 
Slight diffuse soft tissues swelling is seen especially at the proximal fingers and at the metacarpal phalangeal joint level.  No other significant osseous, nor articular abnormality is seen.
IMPRESSION: 1.  Slight soft tissue swelling. 
2.  Otherwise negative.

## 2004-11-01 ENCOUNTER — Ambulatory Visit: Payer: Self-pay | Admitting: Sports Medicine

## 2005-04-03 ENCOUNTER — Encounter: Admission: RE | Admit: 2005-04-03 | Discharge: 2005-04-03 | Payer: Self-pay | Admitting: Sports Medicine

## 2006-07-02 DIAGNOSIS — M1990 Unspecified osteoarthritis, unspecified site: Secondary | ICD-10-CM | POA: Insufficient documentation

## 2006-07-02 DIAGNOSIS — G2589 Other specified extrapyramidal and movement disorders: Secondary | ICD-10-CM | POA: Insufficient documentation

## 2006-07-02 DIAGNOSIS — I059 Rheumatic mitral valve disease, unspecified: Secondary | ICD-10-CM | POA: Insufficient documentation

## 2006-07-02 DIAGNOSIS — M5000 Cervical disc disorder with myelopathy, unspecified cervical region: Secondary | ICD-10-CM | POA: Insufficient documentation

## 2006-07-02 DIAGNOSIS — N951 Menopausal and female climacteric states: Secondary | ICD-10-CM | POA: Insufficient documentation

## 2006-07-02 DIAGNOSIS — F17200 Nicotine dependence, unspecified, uncomplicated: Secondary | ICD-10-CM | POA: Insufficient documentation

## 2007-03-18 ENCOUNTER — Encounter: Admission: RE | Admit: 2007-03-18 | Discharge: 2007-03-18 | Payer: Self-pay | Admitting: Internal Medicine

## 2007-03-18 IMAGING — MG MM SCREEN MAMMOGRAM BILATERAL
1 series · 1 of 1 positions shown · non-contrast
Comparison: none

DG SCREEN MAMMOGRAM BILATERAL
Bilateral CC and MLO view(s) were taken.
Technologist: SAAKJE, RT RM

DIGITAL SCREENING MAMMOGRAM WITH CAD:
There are scattered fibroglandular densities.  No masses or malignant type calcifications are 
identified.  Compared with prior studies.

[L CC]
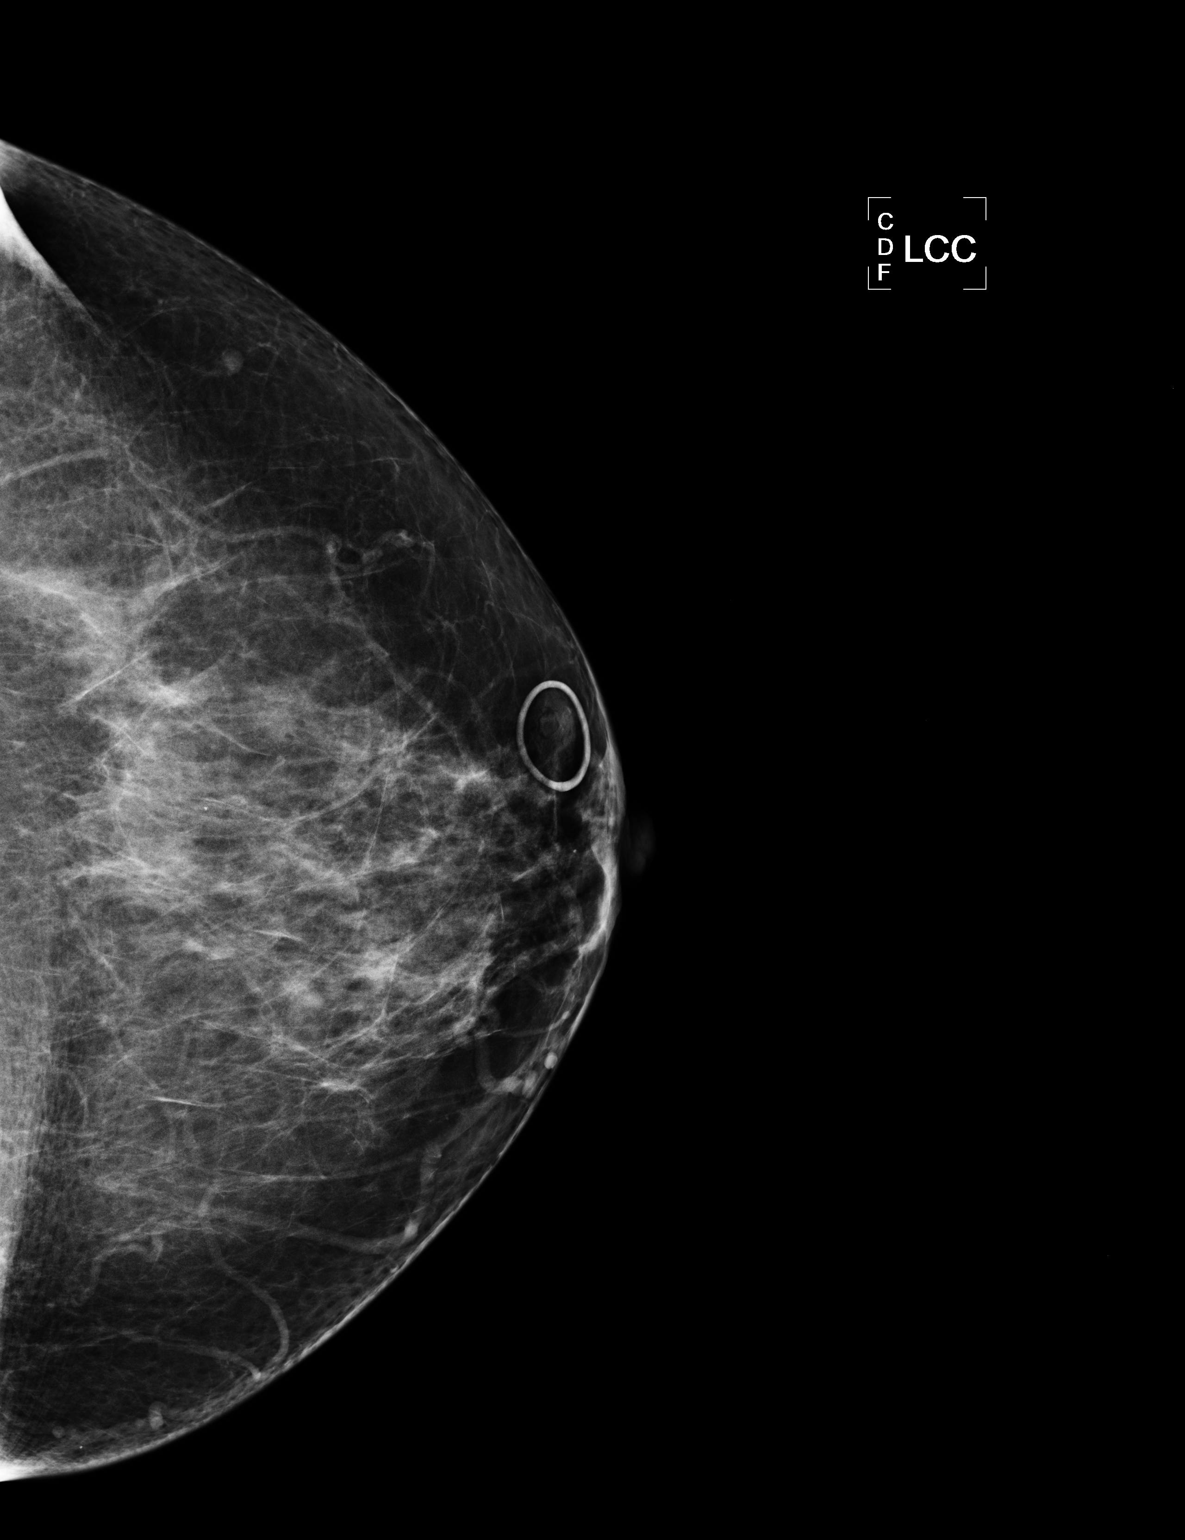

[1 of 1 positions shown; findings below may reference images not displayed]

IMPRESSION: No specific mammographic evidence of malignancy.  Next screening mammogram is recommended in one 
year.

ASSESSMENT: Negative - BI-RADS 1

Screening mammogram in 1 year.
ANALYZED BY COMPUTER AIDED DETECTION. , THIS PROCEDURE WAS A DIGITAL MAMMOGRAM.

## 2007-09-07 ENCOUNTER — Encounter: Admission: RE | Admit: 2007-09-07 | Discharge: 2007-09-07 | Payer: Self-pay | Admitting: Internal Medicine

## 2007-09-07 IMAGING — CT CT VIRTUAL COLONOSCOPY SCREENING
3 of 7 series · 16 of 46 positions shown, 18 images · non-contrast
Comparison: None

CLINICAL DATA: Tortuous colon.

CT VIRTUAL COLONOSCOPY FOR SCREENING:
TECHNIQUE: The patient was given a standard low sodium bowel
preparation with Gastrografin and barium for fluid and stool
tagging respectively.  The quality of the bowel preparation is
excellent.  Automated CO2 insufflation of the colon was performed
prior to image acquisition and colonic distention is excellent.

[Series 2: supine · axial · 0.70mm/px · z∈[-356,-36]mm · 9 of 321 slices shown, 11 images]
[im 33/321  soft-tissue]
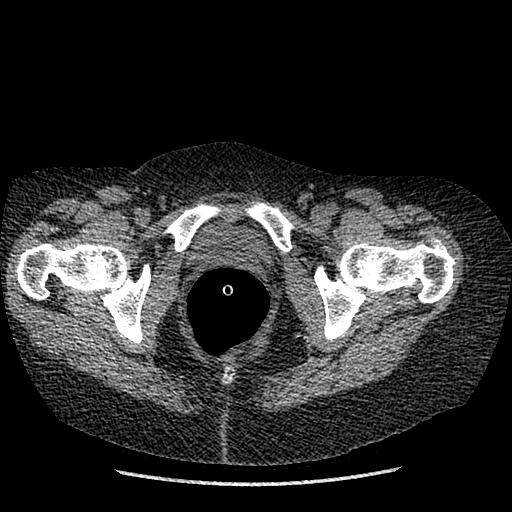
[im 33/321  bone]
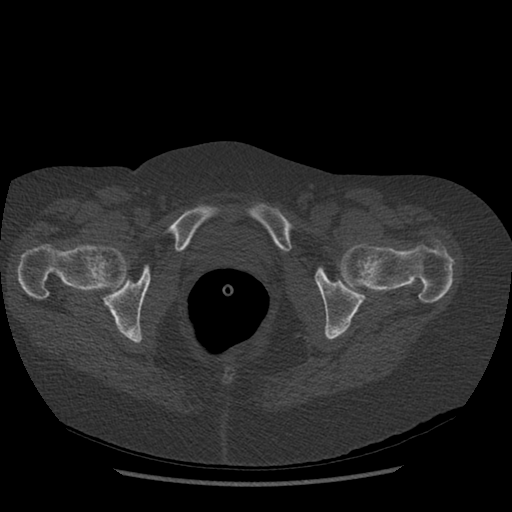
[im 65/321  soft-tissue]
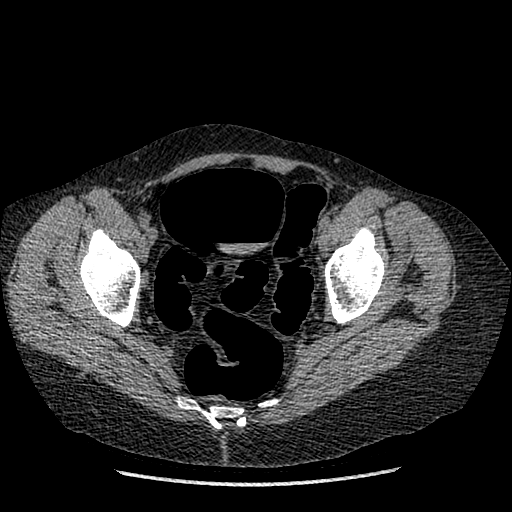
[im 97/321  soft-tissue]
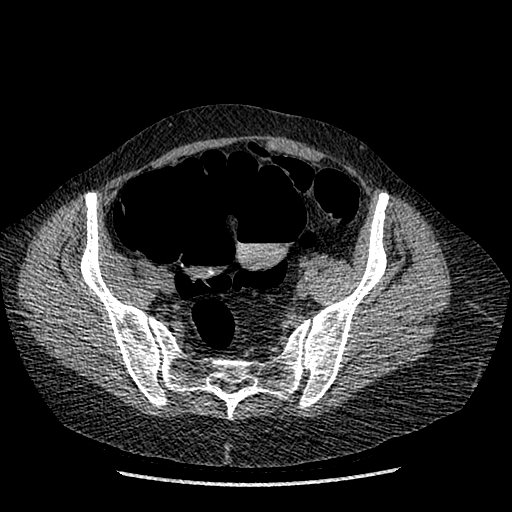
[im 129/321  soft-tissue]
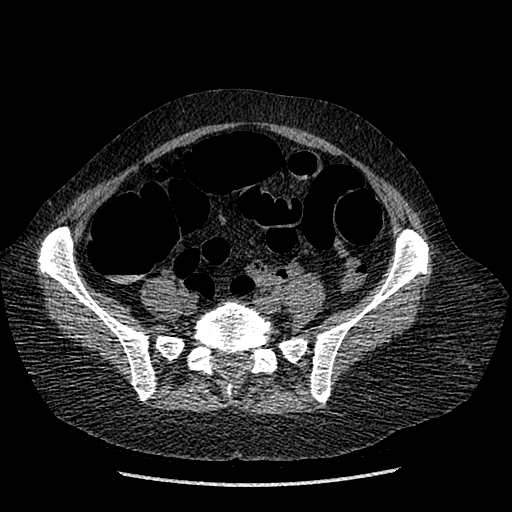
[im 161/321  soft-tissue]
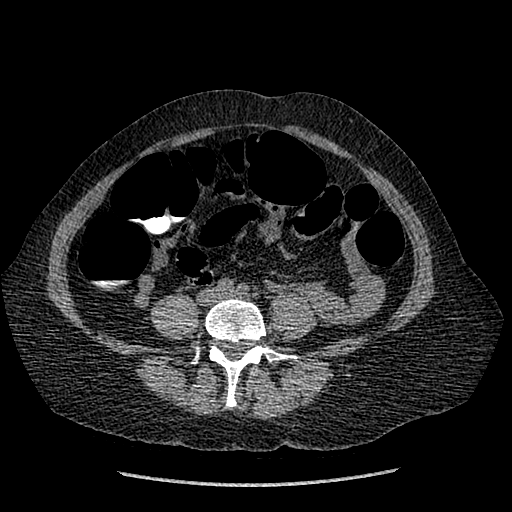
[im 193/321  soft-tissue]
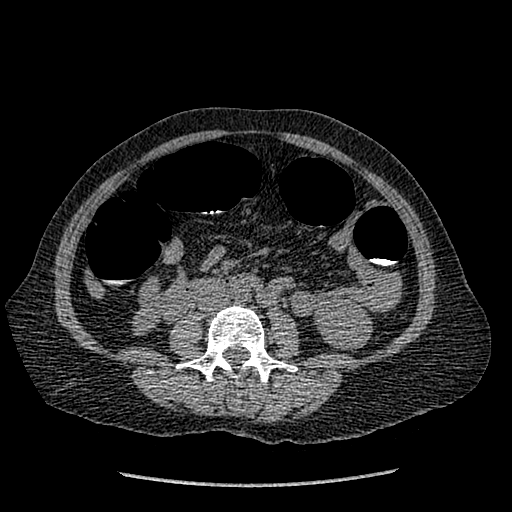
[im 225/321  soft-tissue]
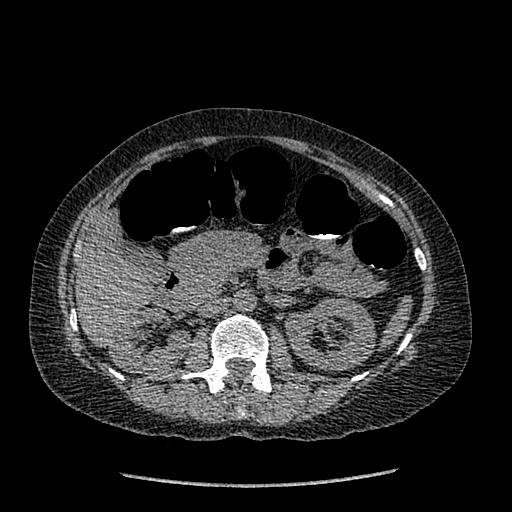
[im 257/321  soft-tissue]
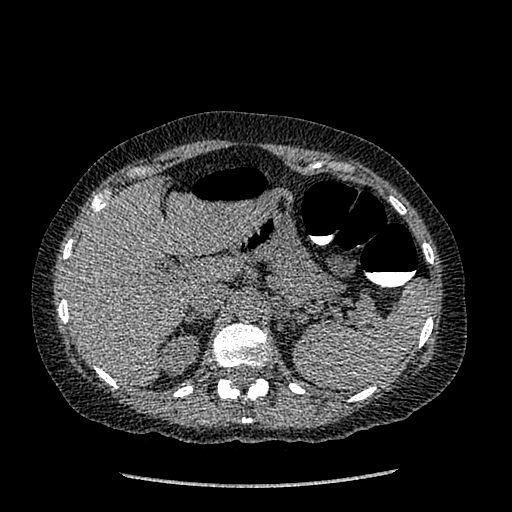
[im 289/321  soft-tissue]
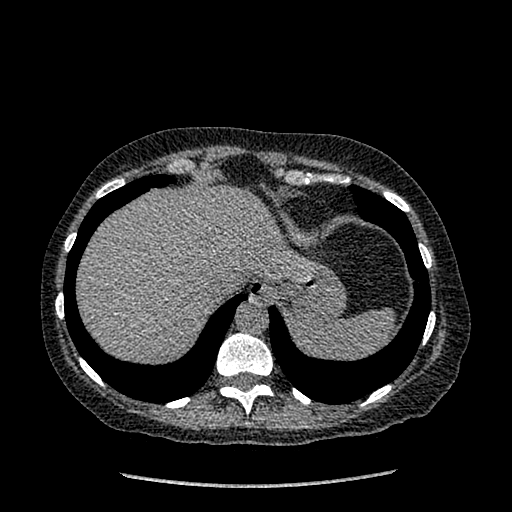
[im 289/321  bone]
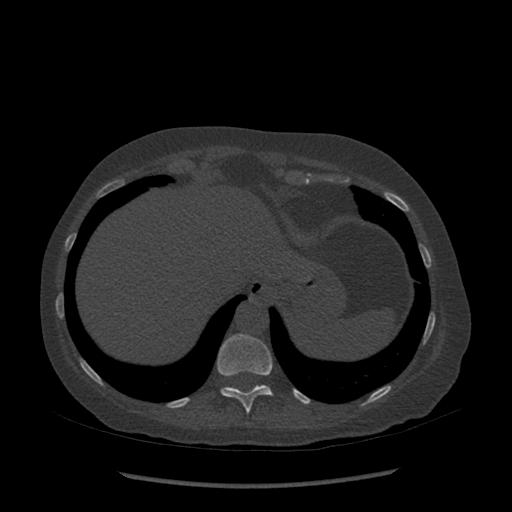

[Series 6: prone · axial · 0.70mm/px · z∈[-182,-48]mm · 4 of 321 slices shown]
[im 36/321  soft-tissue]
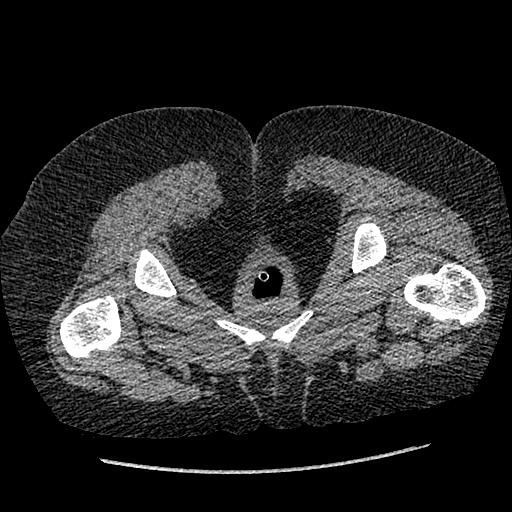
[im 72/321  soft-tissue]
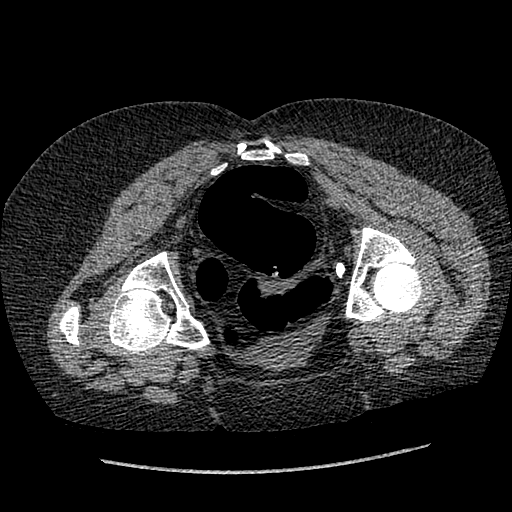
[im 107/321  soft-tissue]
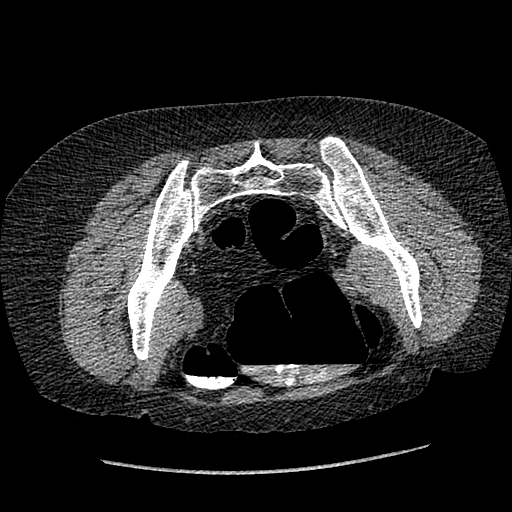
[im 143/321  soft-tissue]
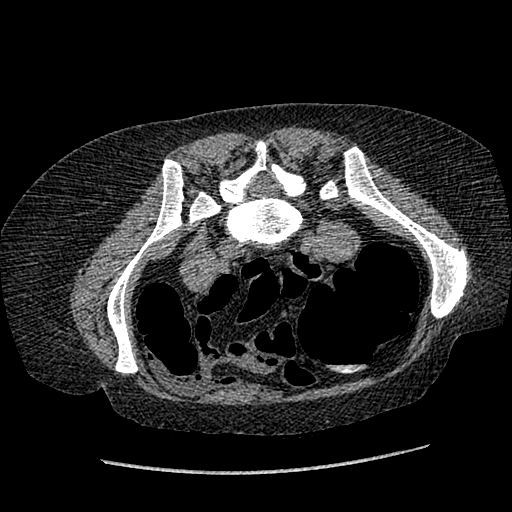

[Series 602: sagittal body · sagittal · 0.78mm/px · 3 of 145 slices shown]
[im 49/145  soft-tissue]
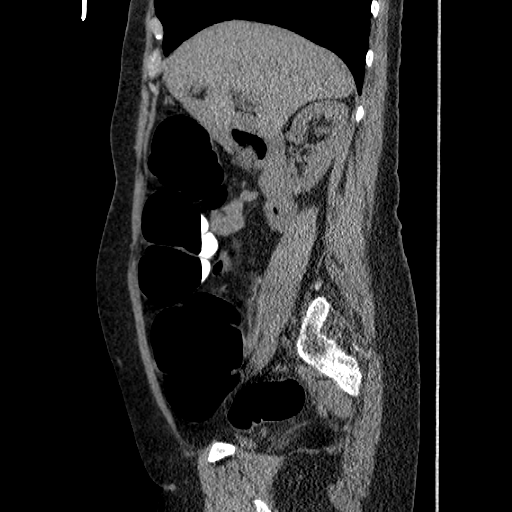
[im 65/145  soft-tissue]
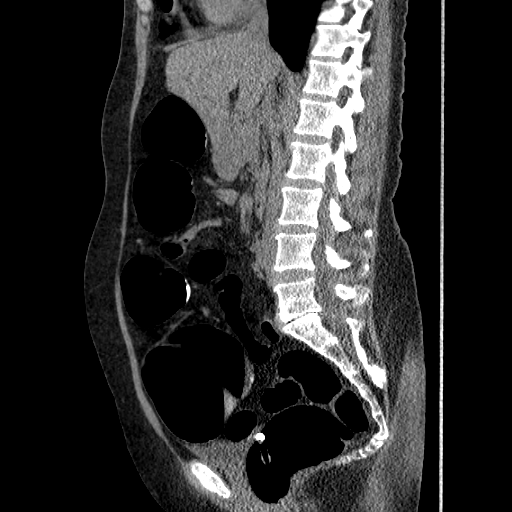
[im 81/145  soft-tissue]
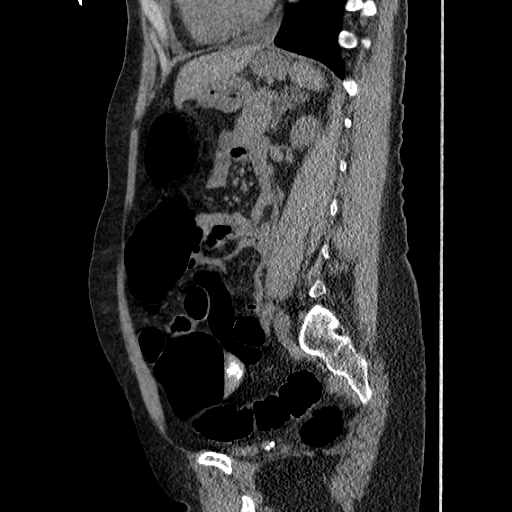

[16 of 46 positions shown; findings below may reference images not displayed]

FINDINGS: There is sessile soft tissue along the lateral wall of
the sigmoid colon, seen at 27.5 cm from the anal verge on supine
imaging, and 30 cm from the anal verge on prone imaging.  On axial
imaging, the lesion measures 1.5 cm on supine imaging (image 248)
and 1.8 cm on prone imaging (image 230).  It is unclear if this is
a true mural lesion. No additional polyps, masses or areas of
stricture.Virtual colonoscopy is not designed to detect diminutive
polyps (i.e., less than or equal to 5 mm), the presence or absence
of which may not affect clinical management.
IMPRESSION: 1.  Possible sessile polyp in the sigmoid colon.  Please see
discussion above.  C-RADS category C2.  Intermediate polyp or
indeterminate finding; surveillance or colonoscopy recommended.

CT ABDOMEN AND PELVIS WITHOUT CONTRAST

CT ABDOMEN
FINDINGS: Lung bases show subsegmental atelectasis or scarring in
the left lower lobe.  No pleural fluid.  Heart size normal.  Small
amount of pericardial fluid.

Liver, gallbladder and right adrenal gland unremarkable.  Cannot
exclude a small left adrenal adenoma.  Kidneys, spleen, pancreas,
stomach and small bowel unremarkable.  No pathologically enlarged
lymph nodes.
IMPRESSION: 1.  Small amount pericardial fluid.
2.  Question small adrenal adenoma.

CT PELVIS
FINDINGS: Colon is discussed in the CT virtual colonoscopy section
of this report, above.  Uterus is absent.  No pathologically
enlarged lymph nodes.  No free fluid.  No worrisome lytic or
sclerotic lesions. There is marked loss of disc space height,
endplate sclerosis and minimal retrolisthesis at L5-S1.
IMPRESSION: 1.  No acute findings.
2.  L5-S1 spondylosis, as above.

## 2008-03-20 ENCOUNTER — Encounter: Admission: RE | Admit: 2008-03-20 | Discharge: 2008-03-20 | Payer: Self-pay | Admitting: Internal Medicine

## 2008-03-20 IMAGING — MG MM SCREEN MAMMOGRAM BILATERAL
4 series · 4 of 4 positions shown · non-contrast
Comparison: none

DG SCREEN MAMMOGRAM BILATERAL
Bilateral CC and MLO view(s) were taken.
Prior study comparison: [DATE], DG screen mammogram bilateral.

DIGITAL SCREENING MAMMOGRAM WITH CAD:
There are scattered fibroglandular densities.  No masses or malignant type calcifications are 
identified.  Compared with prior studies.

[R CC]
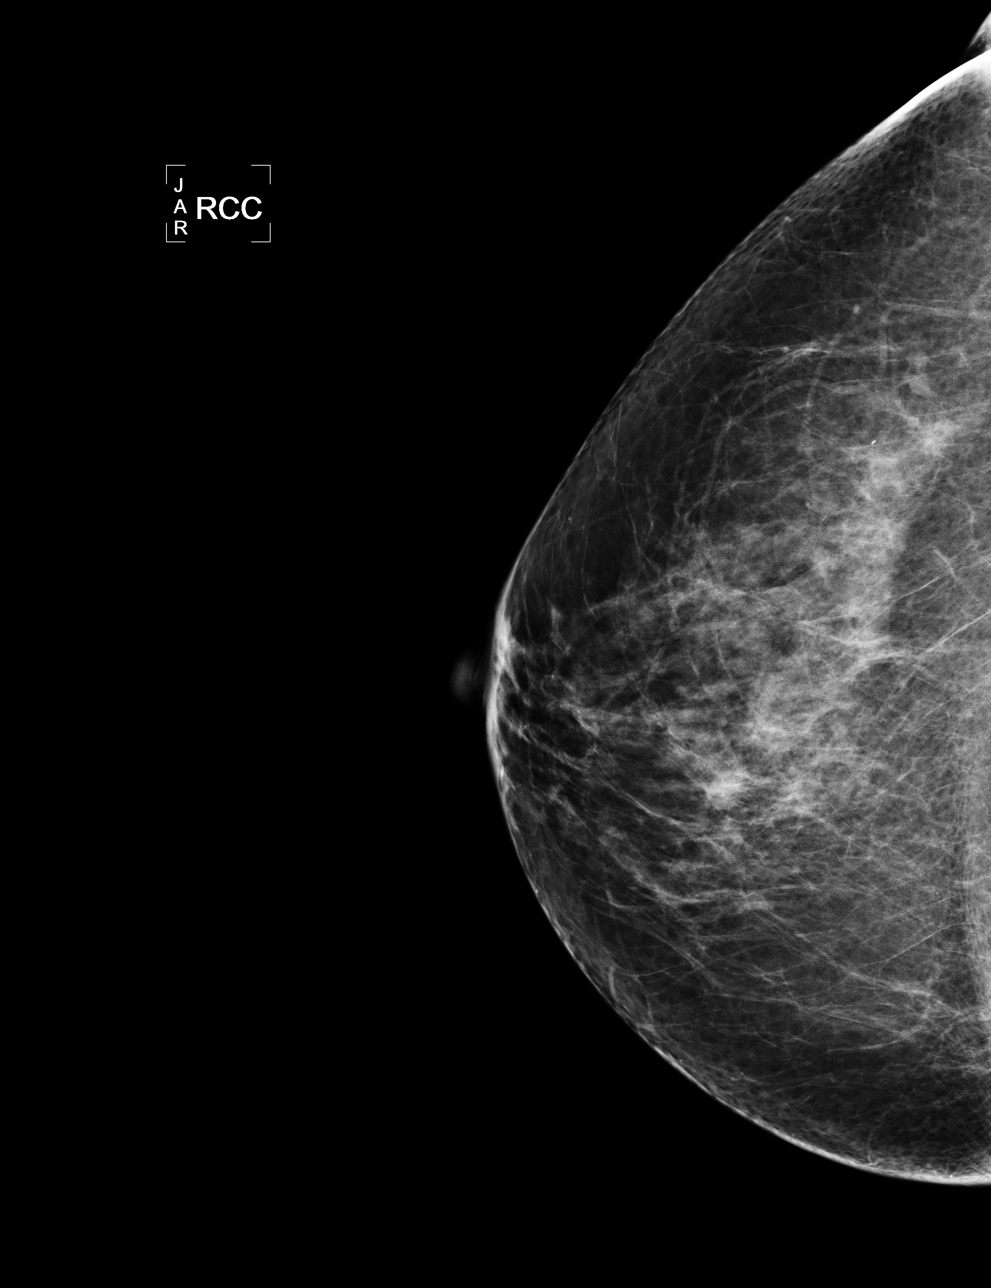

[L CC]
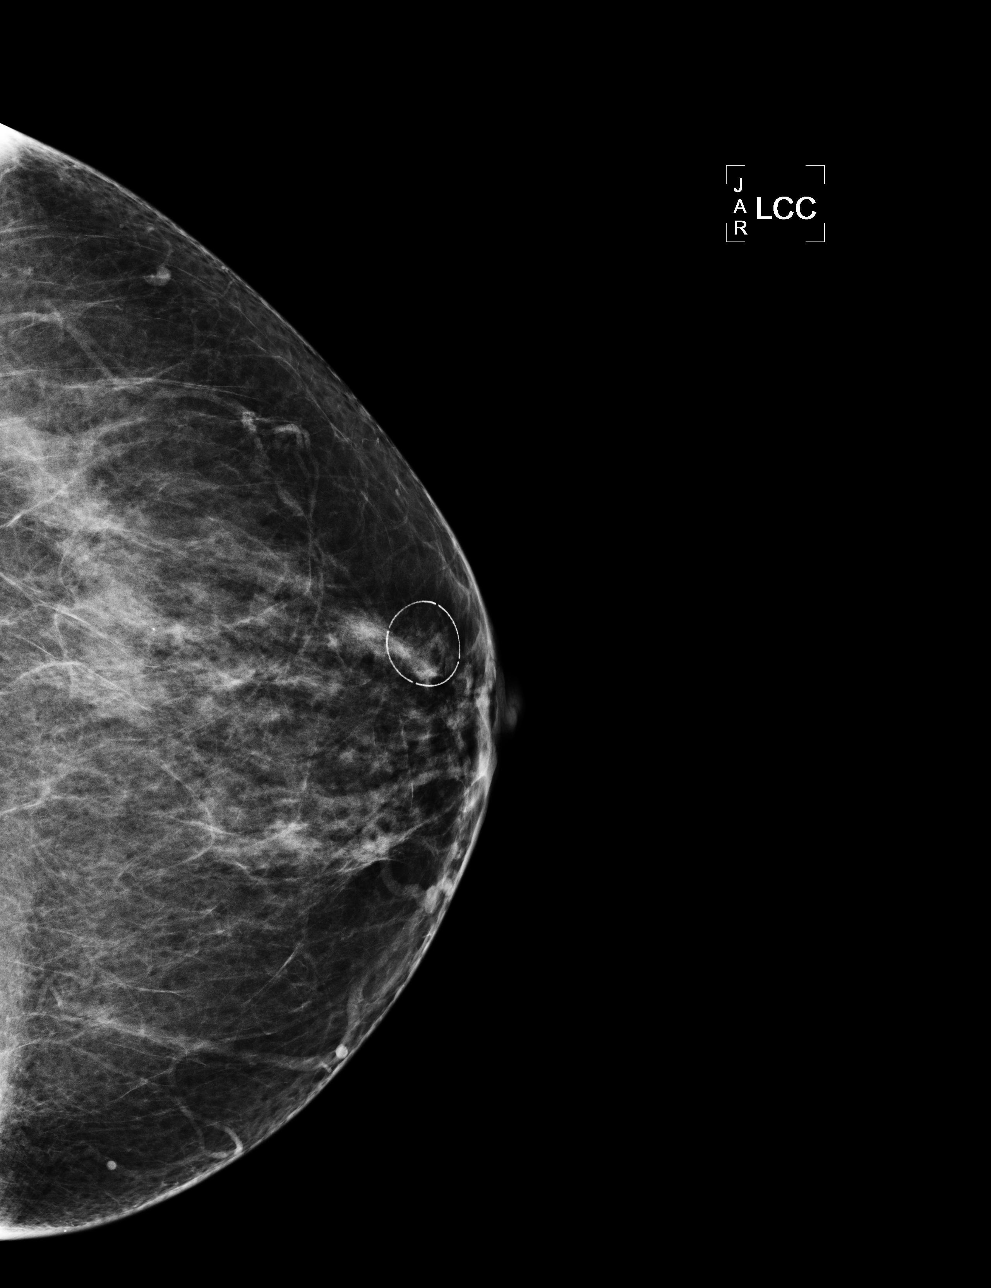

[L MLO]
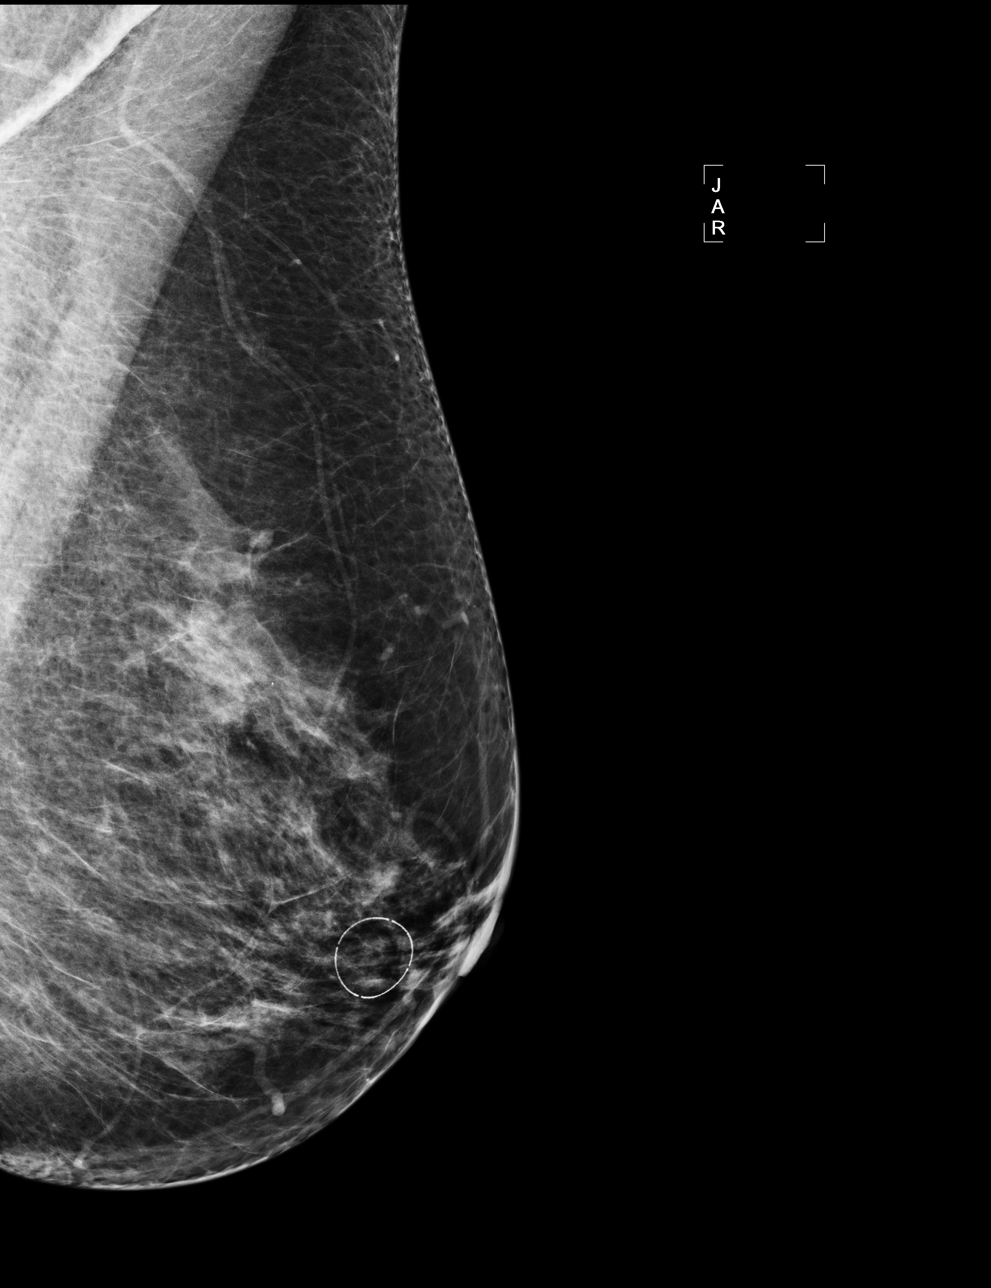

[R MLO]
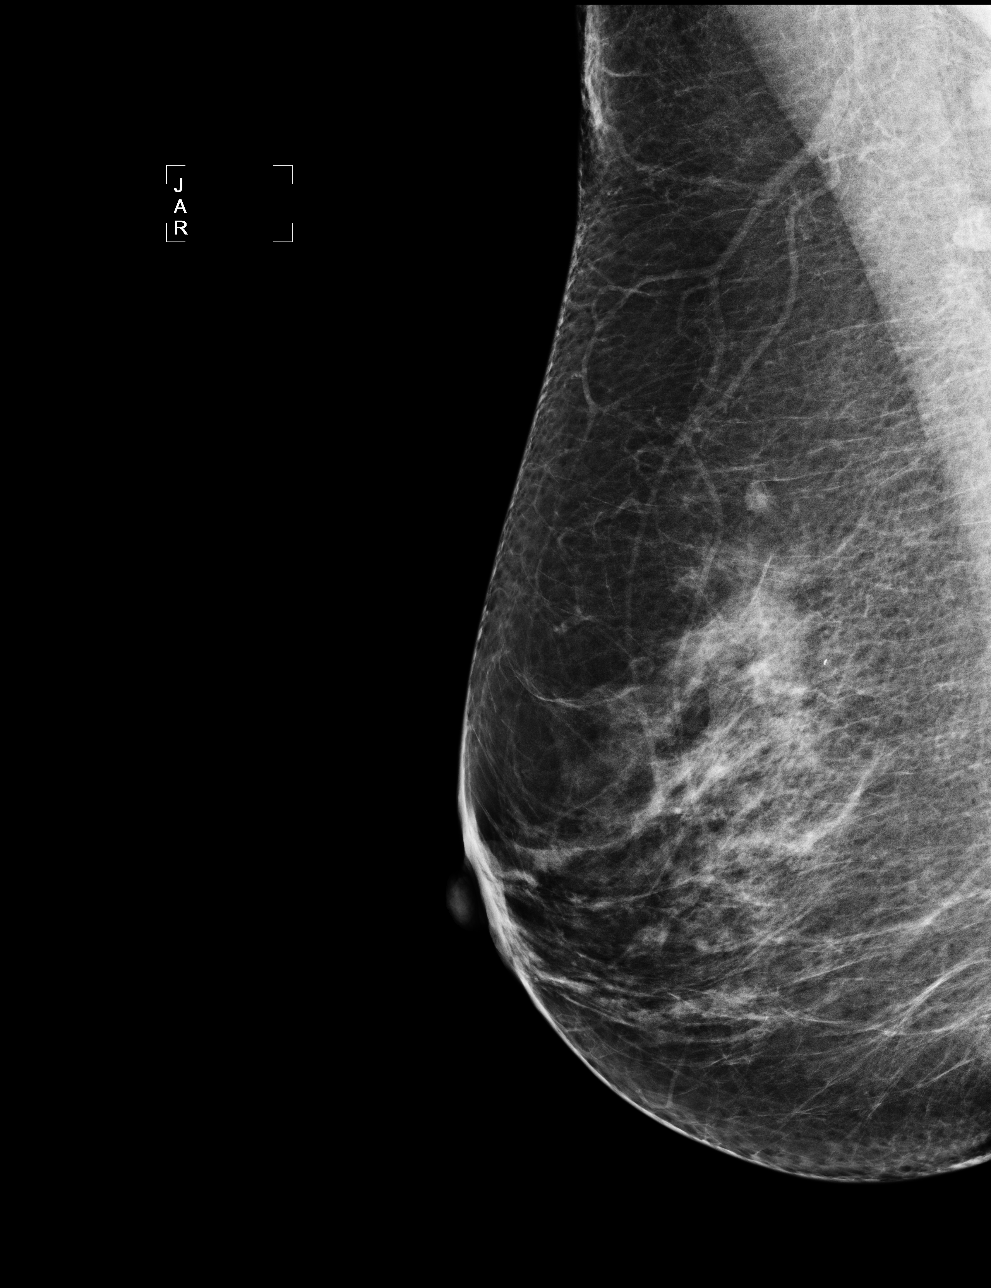

[4 of 4 positions shown; findings below may reference images not displayed]

IMPRESSION: No specific mammographic evidence of malignancy.  Next screening mammogram is recommended in one 
year.

ASSESSMENT: Negative - BI-RADS 1

Screening mammogram in 1 year.
ANALYZED BY COMPUTER AIDED DETECTION. , THIS PROCEDURE WAS A DIGITAL MAMMOGRAM.

## 2010-04-16 ENCOUNTER — Encounter
Admission: RE | Admit: 2010-04-16 | Discharge: 2010-04-16 | Payer: Self-pay | Source: Home / Self Care | Attending: Internal Medicine | Admitting: Internal Medicine

## 2010-04-16 IMAGING — MG MM DIGITAL SCREENING
4 series · 4 of 4 positions shown · non-contrast
Comparison: none

DG SCREEN MAMMOGRAM BILATERAL
Bilateral CC and MLO view(s) were taken.

DIGITAL SCREENING MAMMOGRAM WITH CAD:
There are scattered fibroglandular densities.  No masses or malignant type calcifications are 
identified.  Compared with prior studies.
Images were processed with CAD.

[R CC]
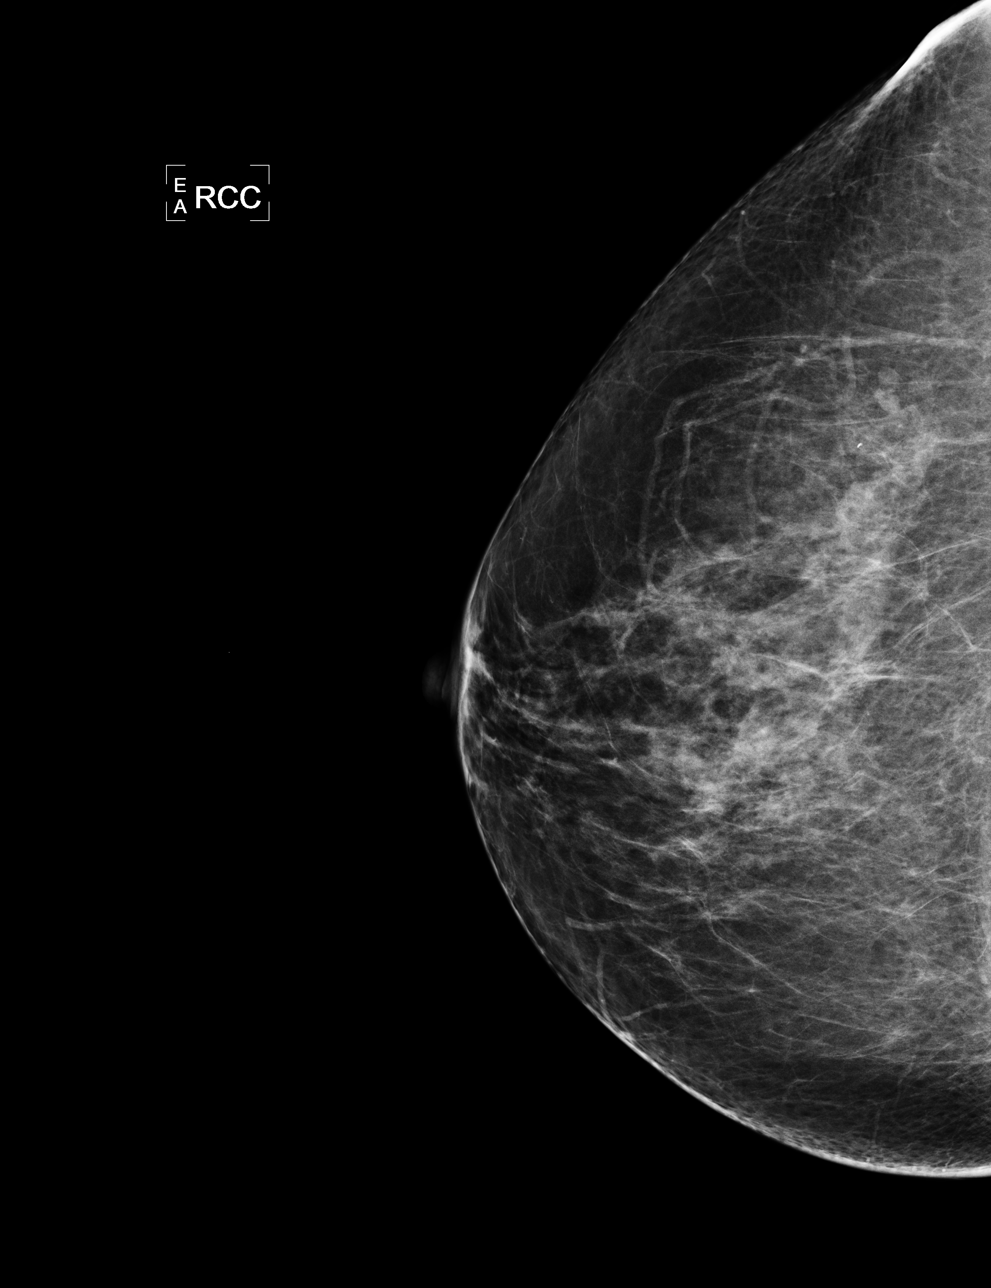

[L CC]
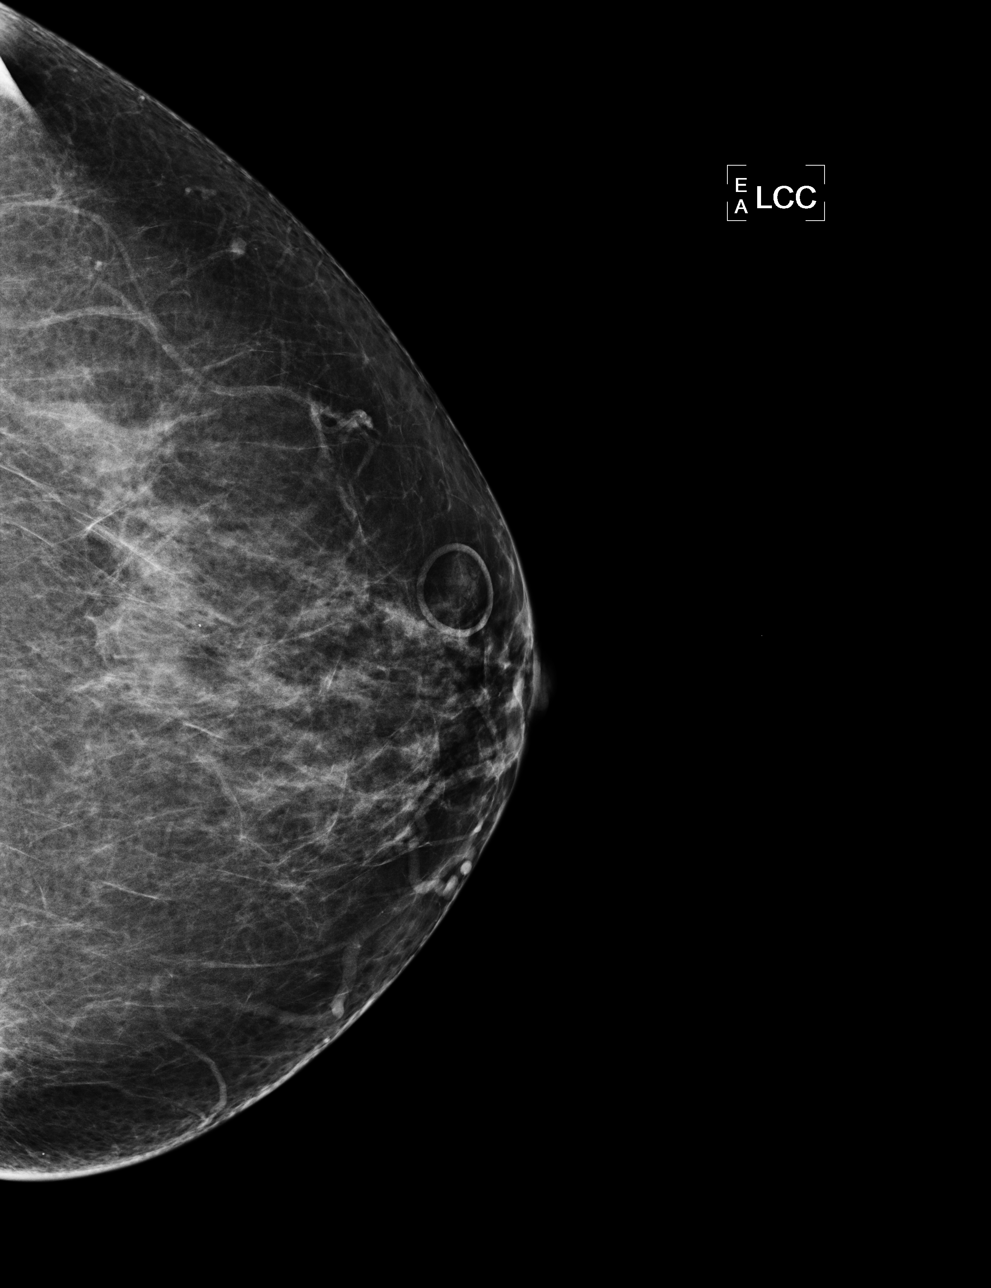

[L MLO]
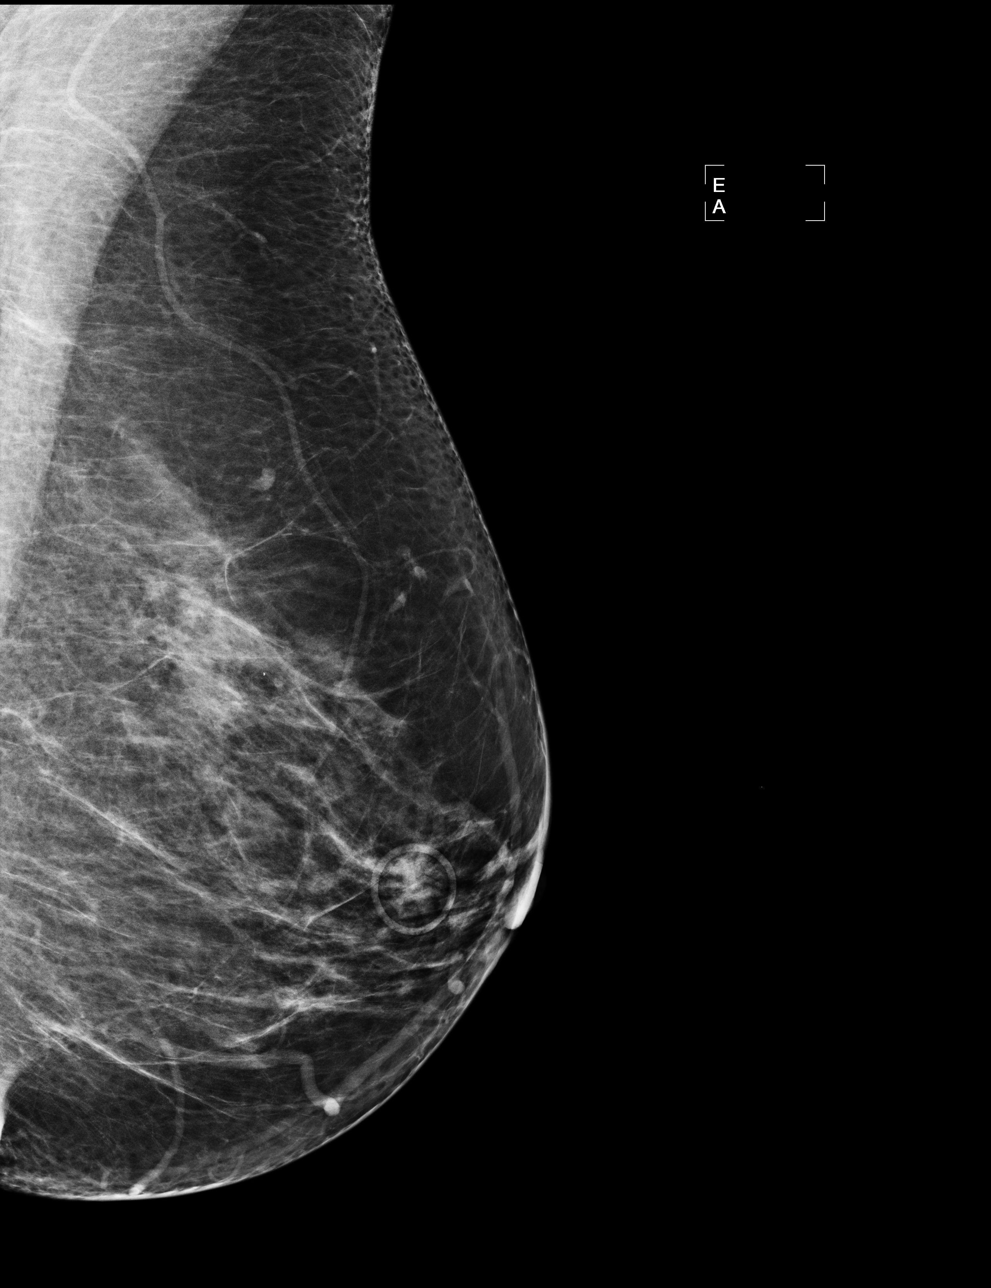

[R MLO]
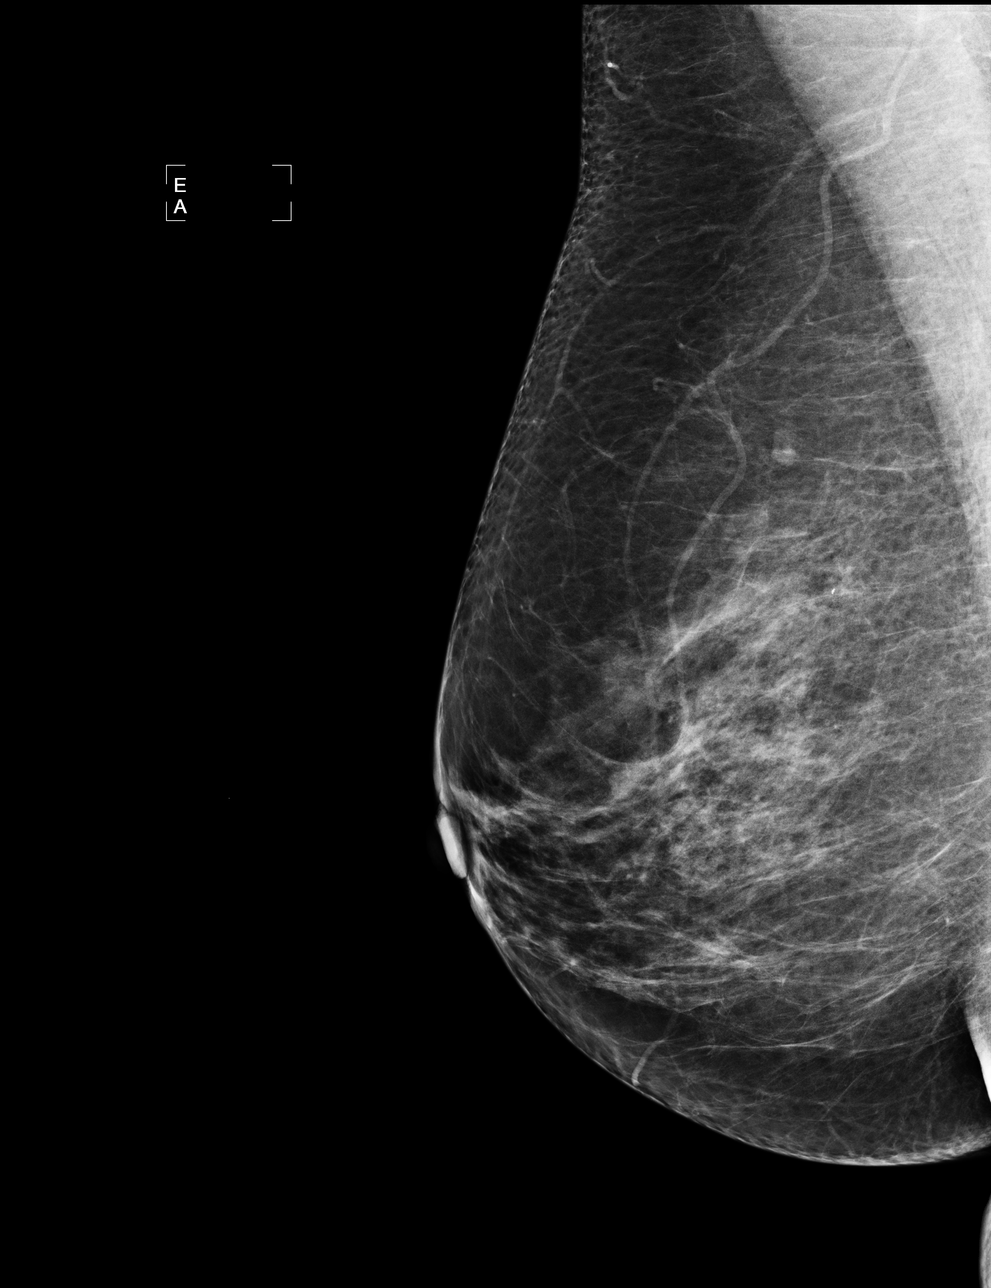

[4 of 4 positions shown; findings below may reference images not displayed]

IMPRESSION: No specific mammographic evidence of malignancy.  Next screening mammogram is recommended in one 
year.

A result letter of this screening mammogram will be mailed directly to the patient.

ASSESSMENT: Negative - BI-RADS 1

Screening mammogram in 1 year.
,

## 2010-09-20 NOTE — Op Note (Signed)
Marilyn Perez, BOSTICK           ACCOUNT NO.:  [id number]   MEDICAL RECORD NO.:  [id number]          PATIENT TYPE:  AMB   LOCATION:  DSC                          FACILITY:  [facility]   PHYSICIAN:  Reinaldo Meeker, M.D. DATE OF BIRTH:  01-10-1951   DATE OF PROCEDURE:  10/03/2004  DATE OF DISCHARGE:                                 OPERATIVE REPORT   PREOPERATIVE DIAGNOSIS:  Right carpal tunnel syndrome.   POSTOPERATIVE DIAGNOSIS:  Right carpal tunnel syndrome.   PROCEDURE:  Right carpal tunnel release.   SURGEON:  Reinaldo Meeker, M.D.   PROCEDURE IN DETAIL:  After induction of regional anesthesia, the patient's  arm, wrist, and hand were prepped and draped in the usual sterile fashion.  A curvilinear incision was made in the right palm starting at the wrist in  line with the ring finger, heading up into the  palm, and then slightly in a  radial direction.  The subcutaneous fat was dissected and self-retaining  retractor was placed for exposure.  The fat was dissected down to the  transverse carpal ligament which was then incised in a proximal to distal  direction.  The median nerve underneath it was well decompressed all the way  up into the proximal wrist and into the mid palm.  At this point, irrigation  was carried out and the wound was closed using interrupted Vicryl on the  subcutaneous tissue and interrupted nylon on the skin.  A sterile dressing  was then applied and the patient was taken to the recovery room in stable  condition.      ROK/MEDQ  D:  10/03/2004  T:  10/03/2004  Job:  [id number]

## 2010-09-20 NOTE — Op Note (Signed)
NAMERANDYE, TREICHLER           ACCOUNT NO.:  [id number]   MEDICAL RECORD NO.:  [id number]          PATIENT TYPE:  OIB   LOCATION:  NA                           FACILITY:  [facility]   PHYSICIAN:  Reinaldo Meeker, M.D. DATE OF BIRTH:  July 10, 1950   DATE OF PROCEDURE:  09/13/2004  DATE OF DISCHARGE:                                 OPERATIVE REPORT   PREOPERATIVE DIAGNOSIS:  Left carpal tunnel syndrome.   POSTOPERATIVE DIAGNOSIS:  Left carpal tunnel syndrome.   OPERATION PERFORMED:  Left carpal tunnel release.   SURGEON:  Reinaldo Meeker, M.D.   ANESTHESIA:  Regional.   DESCRIPTION OF PROCEDURE:  After induction of regional anesthesia, the  patient's hand and forearm were prepped and draped in the usual sterile  fashion.  A curvilinear incision was made starting at the wrist in line with  ring finger heading up into the palm and then directed slightly radially.  Subcutaneous fat was incised and a self-retaining retractor was placed for  exposure .  Then the soft tissue was dissected down to the transverse carpal  ligament. Starting in a proximal and distal direction, the ligament was then  incised in order to decompress the underlying median nerve which was all  visualized.  Thorough decompression was carried out into the proximal wrist  by undermining the skin incision and into the midpalm.  At this time  inspection was carried out in all directions for any evidence of any  residual compression and none could be identified.  Irrigation was carried  out and the wound was then closed with interrupted Vicryl in the  subcutaneous tissue, interrupted nylon on the skin.  Sterile dressing and  Ace wrap were then applied and the patient was taken to the recovery room in  stable condition.      ROK/MEDQ  D:  09/13/2004  T:  09/14/2004  Job:  [id number]

## 2013-02-18 ENCOUNTER — Other Ambulatory Visit: Payer: Self-pay

## 2013-02-18 DIAGNOSIS — Z1231 Encounter for screening mammogram for malignant neoplasm of breast: Secondary | ICD-10-CM

## 2013-03-08 ENCOUNTER — Ambulatory Visit
Admission: RE | Admit: 2013-03-08 | Discharge: 2013-03-08 | Disposition: A | Discharge status: Home / Self Care | Payer: BC Managed Care – PPO | Source: Ambulatory Visit

## 2013-03-08 DIAGNOSIS — Z1231 Encounter for screening mammogram for malignant neoplasm of breast: Secondary | ICD-10-CM

## 2014-03-06 ENCOUNTER — Encounter (HOSPITAL_COMMUNITY): Payer: Self-pay | Admitting: Pharmacy Technician

## 2014-03-06 ENCOUNTER — Other Ambulatory Visit: Payer: Self-pay | Admitting: Neurosurgery

## 2014-03-06 DIAGNOSIS — M4312 Spondylolisthesis, cervical region: Secondary | ICD-10-CM | POA: Diagnosis not present

## 2014-03-06 DIAGNOSIS — Z6828 Body mass index (BMI) 28.0-28.9, adult: Secondary | ICD-10-CM | POA: Diagnosis not present

## 2014-03-08 ENCOUNTER — Encounter (HOSPITAL_COMMUNITY): Payer: Self-pay

## 2014-03-08 ENCOUNTER — Encounter (HOSPITAL_COMMUNITY)
Admission: RE | Admit: 2014-03-08 | Discharge: 2014-03-08 | Disposition: A | Discharge status: Home / Self Care | Payer: Medicare Other | Source: Ambulatory Visit | Attending: Neurosurgery | Admitting: Neurosurgery

## 2014-03-08 DIAGNOSIS — M4802 Spinal stenosis, cervical region: Secondary | ICD-10-CM | POA: Diagnosis present

## 2014-03-08 DIAGNOSIS — Z981 Arthrodesis status: Secondary | ICD-10-CM | POA: Diagnosis not present

## 2014-03-08 DIAGNOSIS — Z79899 Other long term (current) drug therapy: Secondary | ICD-10-CM | POA: Diagnosis not present

## 2014-03-08 DIAGNOSIS — Z880 Allergy status to penicillin: Secondary | ICD-10-CM | POA: Diagnosis not present

## 2014-03-08 DIAGNOSIS — Z7952 Long term (current) use of systemic steroids: Secondary | ICD-10-CM | POA: Diagnosis not present

## 2014-03-08 DIAGNOSIS — Z4889 Encounter for other specified surgical aftercare: Secondary | ICD-10-CM | POA: Diagnosis not present

## 2014-03-08 DIAGNOSIS — F17210 Nicotine dependence, cigarettes, uncomplicated: Secondary | ICD-10-CM | POA: Diagnosis present

## 2014-03-08 DIAGNOSIS — M4312 Spondylolisthesis, cervical region: Secondary | ICD-10-CM | POA: Diagnosis present

## 2014-03-08 HISTORY — DX: Anxiety disorder, unspecified: F41.9

## 2014-03-08 HISTORY — DX: Unspecified osteoarthritis, unspecified site: M19.90

## 2014-03-08 LAB — CBC
HCT: 33.7 % — ABNORMAL LOW (ref 36.0–46.0)
Hemoglobin: 11 g/dL — ABNORMAL LOW (ref 12.0–15.0)
MCH: 30.6 pg (ref 26.0–34.0)
MCHC: 32.6 g/dL (ref 30.0–36.0)
MCV: 93.6 fL (ref 78.0–100.0)
Platelets: 190 10*3/uL (ref 150–400)
RBC: 3.6 MIL/uL — ABNORMAL LOW (ref 3.87–5.11)
RDW: 13.9 % (ref 11.5–15.5)
WBC: 6.9 10*3/uL (ref 4.0–10.5)

## 2014-03-08 LAB — BASIC METABOLIC PANEL
Anion gap: 10 (ref 5–15)
BUN: 13 mg/dL (ref 6–23)
CHLORIDE: 105 meq/L (ref 96–112)
CO2: 24 mEq/L (ref 19–32)
Calcium: 8.9 mg/dL (ref 8.4–10.5)
Creatinine, Ser: 0.73 mg/dL (ref 0.50–1.10)
GFR calc non Af Amer: 88 mL/min — ABNORMAL LOW (ref >90)
Glucose, Bld: 136 mg/dL — ABNORMAL HIGH (ref 70–99)
Potassium: 4.2 mEq/L (ref 3.7–5.3)
Sodium: 139 mEq/L (ref 137–147)

## 2014-03-08 LAB — SURGICAL PCR SCREEN
MRSA, PCR: NEGATIVE
Staphylococcus aureus: NEGATIVE

## 2014-03-08 NOTE — Pre-Procedure Instructions (Signed)
Marilyn Perez  03/08/2014   Your procedure is scheduled on:  03/10/14  Report to [facility] Admitting at 530 AM.  Call this number if you have problems the morning of surgery: [phone]   Remember:   Do not eat food or drink liquids after midnight.   Take these medicines the morning of surgery with A SIP OF WATER: cymbalta   Do not wear jewelry, make-up or nail polish.  Do not wear lotions, powders, or perfumes. You may wear deodorant.  Do not shave 48 hours prior to surgery. Men may shave face and neck.  Do not bring valuables to the hospital.  [facility] is not responsible                  for any belongings or valuables.               Contacts, dentures or bridgework may not be worn into surgery.  Leave suitcase in the car. After surgery it may be brought to your room.  For patients admitted to the hospital, discharge time is determined by your                treatment team.               Patients discharged the day of surgery will not be allowed to drive  home.  Name and phone number of your driver:   Special Instructions: Incentive Spirometry - Practice and bring it with you on the day of surgery.   Please read over the following fact sheets that you were given: Pain Booklet, Coughing and Deep Breathing, MRSA Information and Surgical Site Infection Prevention

## 2014-03-10 ENCOUNTER — Encounter (HOSPITAL_COMMUNITY): Payer: Self-pay | Admitting: *Deleted

## 2014-03-10 ENCOUNTER — Encounter (HOSPITAL_COMMUNITY)
Admission: RE | Disposition: A | Discharge status: Home / Self Care | Payer: Self-pay | Source: Ambulatory Visit | Attending: Neurosurgery

## 2014-03-10 ENCOUNTER — Inpatient Hospital Stay (HOSPITAL_COMMUNITY): Payer: Medicare Other

## 2014-03-10 ENCOUNTER — Inpatient Hospital Stay (HOSPITAL_COMMUNITY): Payer: Medicare Other | Admitting: Anesthesiology

## 2014-03-10 ENCOUNTER — Inpatient Hospital Stay (HOSPITAL_COMMUNITY)
Admission: RE | Admit: 2014-03-10 | Discharge: 2014-03-10 | DRG: 473 | Disposition: A | Discharge status: Home / Self Care | Payer: Medicare Other | Source: Ambulatory Visit | Attending: Neurosurgery | Admitting: Neurosurgery

## 2014-03-10 DIAGNOSIS — M4802 Spinal stenosis, cervical region: Secondary | ICD-10-CM | POA: Diagnosis not present

## 2014-03-10 DIAGNOSIS — Z880 Allergy status to penicillin: Secondary | ICD-10-CM | POA: Diagnosis not present

## 2014-03-10 DIAGNOSIS — F17210 Nicotine dependence, cigarettes, uncomplicated: Secondary | ICD-10-CM | POA: Diagnosis not present

## 2014-03-10 DIAGNOSIS — Z7952 Long term (current) use of systemic steroids: Secondary | ICD-10-CM

## 2014-03-10 DIAGNOSIS — M4312 Spondylolisthesis, cervical region: Secondary | ICD-10-CM | POA: Diagnosis present

## 2014-03-10 DIAGNOSIS — Z79899 Other long term (current) drug therapy: Secondary | ICD-10-CM

## 2014-03-10 DIAGNOSIS — Z419 Encounter for procedure for purposes other than remedying health state, unspecified: Secondary | ICD-10-CM

## 2014-03-10 DIAGNOSIS — Z981 Arthrodesis status: Secondary | ICD-10-CM | POA: Diagnosis not present

## 2014-03-10 DIAGNOSIS — Z4889 Encounter for other specified surgical aftercare: Secondary | ICD-10-CM | POA: Diagnosis not present

## 2014-03-10 HISTORY — PX: ANTERIOR CERVICAL DECOMP/DISCECTOMY FUSION: SHX1161

## 2014-03-10 IMAGING — RF DG C-ARM 61-120 MIN
1 series · 1 of 1 positions shown · non-contrast
Comparison: None

CLINICAL DATA: Status post anterior fusion

EXAM:
DG C-ARM 61-120 MIN; DG CERVICAL SPINE - 1 VIEW

[Series 1: run · 1 of 1 slices shown]
[im 1/1]
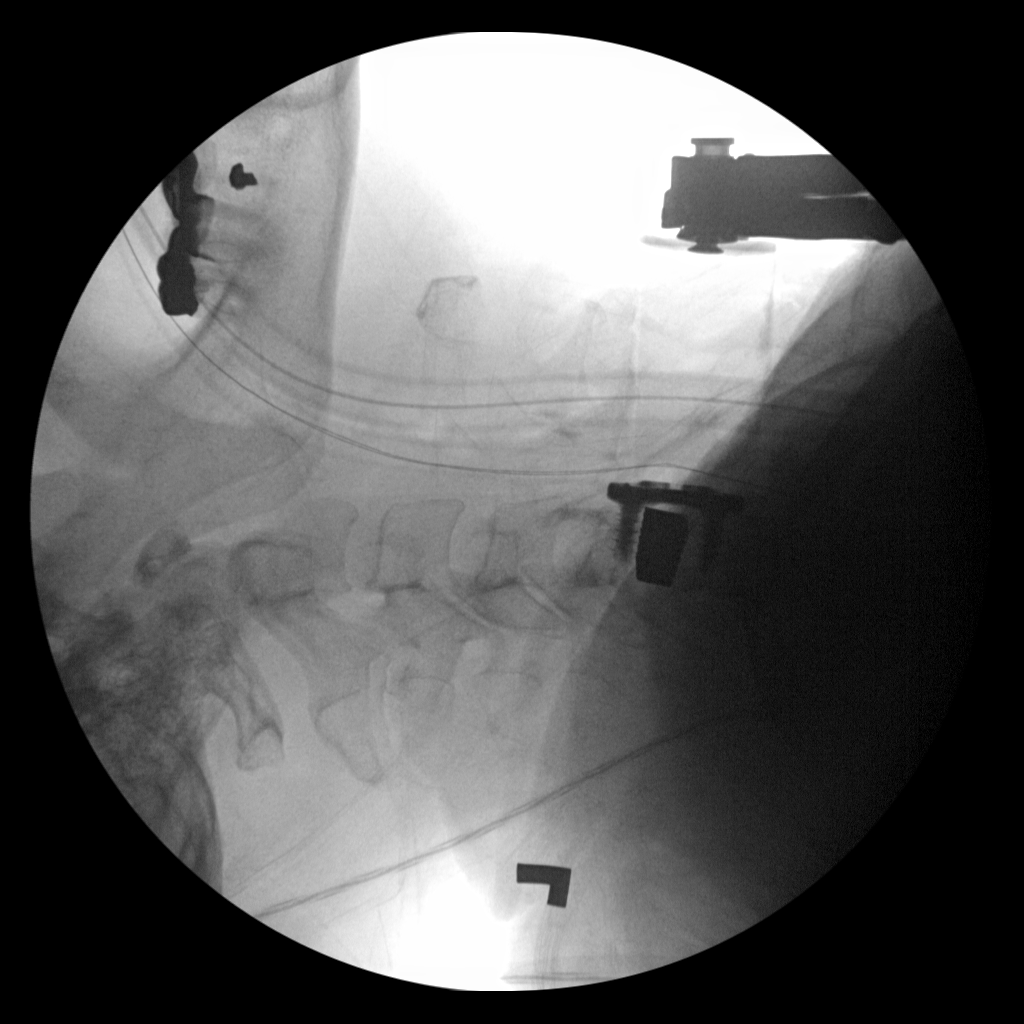

[1 of 1 positions shown; findings below may reference images not displayed]

FINDINGS: Cross-table lateral image shows anterior screw and plate fixation at
C5 and C6. The screw and plate fixation device as well as a bony
plug at C5-6 appear intact. No fracture or spondylolisthesis.
IMPRESSION: Postoperative changes C5-6 with the screw and plate fixation device
as well as disc spacer intact. No apparent fracture or
spondylolisthesis.

## 2014-03-10 SURGERY — ANTERIOR CERVICAL DECOMPRESSION/DISCECTOMY FUSION 1 LEVEL
Anesthesia: General | Site: Neck

## 2014-03-10 MED ORDER — MIDAZOLAM HCL 2 MG/2ML IJ SOLN
INTRAMUSCULAR | Status: AC
Start: 2014-03-10 — End: 2014-03-10
  Filled 2014-03-10: qty 2

## 2014-03-10 MED ORDER — SODIUM CHLORIDE 0.9 % IJ SOLN
3.0000 mL | Freq: Two times a day (BID) | INTRAMUSCULAR | Status: DC
  Administered 2014-03-10: 3 mL via INTRAVENOUS

## 2014-03-10 MED ORDER — LACTATED RINGERS IV SOLN
INTRAVENOUS | Status: DC | PRN
  Administered 2014-03-10 (×2): via INTRAVENOUS

## 2014-03-10 MED ORDER — PREDNISONE 5 MG PO TABS
5.0000 mg | ORAL_TABLET | Freq: Every day | ORAL | Status: DC
  Filled 2014-03-10 (×2): qty 1

## 2014-03-10 MED ORDER — LIDOCAINE HCL (CARDIAC) 20 MG/ML IV SOLN
INTRAVENOUS | Status: DC | PRN
  Administered 2014-03-10: 100 mg via INTRAVENOUS

## 2014-03-10 MED ORDER — ONDANSETRON HCL 4 MG/2ML IJ SOLN
INTRAMUSCULAR | Status: DC | PRN
  Administered 2014-03-10: 4 mg via INTRAVENOUS

## 2014-03-10 MED ORDER — BACITRACIN 50000 UNITS IM SOLR
INTRAMUSCULAR | Status: DC | PRN
  Administered 2014-03-10: 09:00:00

## 2014-03-10 MED ORDER — FENTANYL CITRATE 0.05 MG/ML IJ SOLN
INTRAMUSCULAR | Status: AC
  Filled 2014-03-10: qty 5

## 2014-03-10 MED ORDER — NEOSTIGMINE METHYLSULFATE 10 MG/10ML IV SOLN
INTRAVENOUS | Status: DC | PRN
  Administered 2014-03-10: 4 mg via INTRAVENOUS

## 2014-03-10 MED ORDER — VANCOMYCIN HCL IN DEXTROSE 1-5 GM/200ML-% IV SOLN
1000.0000 mg | Freq: Once | INTRAVENOUS | Status: DC
  Filled 2014-03-10: qty 200

## 2014-03-10 MED ORDER — ACETAMINOPHEN 650 MG RE SUPP: 650.0000 mg | RECTAL | Status: DC | PRN

## 2014-03-10 MED ORDER — ONDANSETRON HCL 4 MG/2ML IJ SOLN
INTRAMUSCULAR | Status: AC
  Filled 2014-03-10: qty 2

## 2014-03-10 MED ORDER — PROPOFOL 10 MG/ML IV BOLUS
INTRAVENOUS | Status: AC
  Filled 2014-03-10: qty 20

## 2014-03-10 MED ORDER — FENTANYL CITRATE 0.05 MG/ML IJ SOLN
25.0000 ug | INTRAMUSCULAR | Status: DC | PRN
  Administered 2014-03-10: 50 ug via INTRAVENOUS

## 2014-03-10 MED ORDER — CYCLOBENZAPRINE HCL 10 MG PO TABS
10.0000 mg | ORAL_TABLET | Freq: Three times a day (TID) | ORAL | Status: DC | PRN
  Administered 2014-03-10: 10 mg via ORAL
  Filled 2014-03-10: qty 1

## 2014-03-10 MED ORDER — MIDAZOLAM HCL 2 MG/2ML IJ SOLN: 1.0000 mg | Freq: Once | INTRAMUSCULAR | Status: DC

## 2014-03-10 MED ORDER — ROPINIROLE HCL 1 MG PO TABS
1.0000 mg | ORAL_TABLET | Freq: Every day | ORAL | Status: DC
  Filled 2014-03-10: qty 1

## 2014-03-10 MED ORDER — DULOXETINE HCL 60 MG PO CPEP
60.0000 mg | ORAL_CAPSULE | Freq: Every day | ORAL | Status: DC
  Filled 2014-03-10: qty 1

## 2014-03-10 MED ORDER — ROCURONIUM BROMIDE 50 MG/5ML IV SOLN
INTRAVENOUS | Status: AC
Start: 2014-03-10 — End: 2014-03-10
  Filled 2014-03-10: qty 1

## 2014-03-10 MED ORDER — LIDOCAINE HCL (CARDIAC) 20 MG/ML IV SOLN
INTRAVENOUS | Status: AC
  Filled 2014-03-10: qty 5

## 2014-03-10 MED ORDER — DEXAMETHASONE 4 MG PO TABS: 4.0000 mg | ORAL_TABLET | Freq: Four times a day (QID) | ORAL | Status: DC

## 2014-03-10 MED ORDER — ARTIFICIAL TEARS OP OINT
TOPICAL_OINTMENT | OPHTHALMIC | Status: DC | PRN
  Administered 2014-03-10: 1 via OPHTHALMIC

## 2014-03-10 MED ORDER — PROMETHAZINE HCL 25 MG/ML IJ SOLN: 6.2500 mg | INTRAMUSCULAR | Status: DC | PRN

## 2014-03-10 MED ORDER — ARTIFICIAL TEARS OP OINT
TOPICAL_OINTMENT | OPHTHALMIC | Status: AC
  Filled 2014-03-10: qty 3.5

## 2014-03-10 MED ORDER — LEFLUNOMIDE 20 MG PO TABS
20.0000 mg | ORAL_TABLET | Freq: Every day | ORAL | Status: DC
  Filled 2014-03-10: qty 1

## 2014-03-10 MED ORDER — THROMBIN 5000 UNITS EX SOLR
CUTANEOUS | Status: DC | PRN
  Administered 2014-03-10 (×2): 5000 [IU] via TOPICAL

## 2014-03-10 MED ORDER — GLYCOPYRROLATE 0.2 MG/ML IJ SOLN
INTRAMUSCULAR | Status: AC
  Filled 2014-03-10: qty 3

## 2014-03-10 MED ORDER — MIDAZOLAM HCL 2 MG/2ML IJ SOLN
INTRAMUSCULAR | Status: AC
  Administered 2014-03-10: 1 mg
  Filled 2014-03-10: qty 2

## 2014-03-10 MED ORDER — MEPERIDINE HCL 25 MG/ML IJ SOLN: 6.2500 mg | INTRAMUSCULAR | Status: DC | PRN

## 2014-03-10 MED ORDER — FENTANYL CITRATE 0.05 MG/ML IJ SOLN
INTRAMUSCULAR | Status: AC
  Filled 2014-03-10: qty 2

## 2014-03-10 MED ORDER — HYDROCODONE-ACETAMINOPHEN 5-325 MG PO TABS
1.0000 | ORAL_TABLET | ORAL | Status: DC | PRN
  Administered 2014-03-10: 2 via ORAL
  Filled 2014-03-10: qty 2

## 2014-03-10 MED ORDER — MIDAZOLAM HCL 5 MG/5ML IJ SOLN
INTRAMUSCULAR | Status: DC | PRN
  Administered 2014-03-10: 2 mg via INTRAVENOUS

## 2014-03-10 MED ORDER — THROMBIN 5000 UNITS EX SOLR
OROMUCOSAL | Status: DC | PRN
  Administered 2014-03-10: 09:00:00 via TOPICAL

## 2014-03-10 MED ORDER — 0.9 % SODIUM CHLORIDE (POUR BTL) OPTIME
TOPICAL | Status: DC | PRN
  Administered 2014-03-10: 1000 mL

## 2014-03-10 MED ORDER — ROCURONIUM BROMIDE 100 MG/10ML IV SOLN
INTRAVENOUS | Status: DC | PRN
  Administered 2014-03-10: 50 mg via INTRAVENOUS

## 2014-03-10 MED ORDER — HEMOSTATIC AGENTS (NO CHARGE) OPTIME
TOPICAL | Status: DC | PRN
  Administered 2014-03-10: 1 via TOPICAL

## 2014-03-10 MED ORDER — MENTHOL 3 MG MT LOZG
1.0000 | LOZENGE | OROMUCOSAL | Status: DC | PRN
  Filled 2014-03-10: qty 9

## 2014-03-10 MED ORDER — DEXMEDETOMIDINE BOLUS VIA INFUSION
INTRAVENOUS | Status: DC | PRN
  Administered 2014-03-10: 16 ug via INTRAVENOUS

## 2014-03-10 MED ORDER — HYDROMORPHONE HCL 1 MG/ML IJ SOLN
1.0000 mg | INTRAMUSCULAR | Status: DC | PRN
  Administered 2014-03-10: 1 mg via INTRAMUSCULAR
  Filled 2014-03-10: qty 1

## 2014-03-10 MED ORDER — PHENOL 1.4 % MT LIQD
1.0000 | OROMUCOSAL | Status: DC | PRN
  Administered 2014-03-10: 1 via OROMUCOSAL
  Filled 2014-03-10: qty 177

## 2014-03-10 MED ORDER — ACETAMINOPHEN 10 MG/ML IV SOLN
INTRAVENOUS | Status: AC
  Administered 2014-03-10: 1000 mg via INTRAVENOUS
  Filled 2014-03-10: qty 100

## 2014-03-10 MED ORDER — NEOSTIGMINE METHYLSULFATE 10 MG/10ML IV SOLN
INTRAVENOUS | Status: AC
  Filled 2014-03-10: qty 1

## 2014-03-10 MED ORDER — PROPOFOL 10 MG/ML IV BOLUS
INTRAVENOUS | Status: DC | PRN
  Administered 2014-03-10: 120 mg via INTRAVENOUS

## 2014-03-10 MED ORDER — GLYCOPYRROLATE 0.2 MG/ML IJ SOLN
INTRAMUSCULAR | Status: DC | PRN
  Administered 2014-03-10: 0.6 mg via INTRAVENOUS

## 2014-03-10 MED ORDER — FENTANYL CITRATE 0.05 MG/ML IJ SOLN
INTRAMUSCULAR | Status: DC | PRN
  Administered 2014-03-10: 100 ug via INTRAVENOUS
  Administered 2014-03-10 (×5): 50 ug via INTRAVENOUS

## 2014-03-10 MED ORDER — VANCOMYCIN HCL 10 G IV SOLR
1250.0000 mg | INTRAVENOUS | Status: AC
  Administered 2014-03-10: 1.25 g via INTRAVENOUS
  Filled 2014-03-10: qty 1250

## 2014-03-10 MED ORDER — HYDROCORTISONE NA SUCCINATE PF 1000 MG IJ SOLR
INTRAMUSCULAR | Status: DC | PRN
  Administered 2014-03-10: 100 mg via INTRAVENOUS

## 2014-03-10 MED ORDER — ONDANSETRON HCL 4 MG/2ML IJ SOLN: 4.0000 mg | INTRAMUSCULAR | Status: DC | PRN

## 2014-03-10 MED ORDER — DEXAMETHASONE SODIUM PHOSPHATE 4 MG/ML IJ SOLN
4.0000 mg | Freq: Four times a day (QID) | INTRAMUSCULAR | Status: DC
  Administered 2014-03-10: 4 mg via INTRAVENOUS

## 2014-03-10 MED ORDER — KCL IN DEXTROSE-NACL 20-5-0.45 MEQ/L-%-% IV SOLN
80.0000 mL/h | INTRAVENOUS | Status: DC
  Filled 2014-03-10 (×2): qty 1000

## 2014-03-10 MED ORDER — ACETAMINOPHEN 325 MG PO TABS: 650.0000 mg | ORAL_TABLET | ORAL | Status: DC | PRN

## 2014-03-10 MED ORDER — ALBUTEROL SULFATE HFA 108 (90 BASE) MCG/ACT IN AERS
INHALATION_SPRAY | RESPIRATORY_TRACT | Status: DC | PRN
  Administered 2014-03-10: 4 via RESPIRATORY_TRACT

## 2014-03-10 MED ORDER — SODIUM CHLORIDE 0.9 % IJ SOLN: 3.0000 mL | INTRAMUSCULAR | Status: DC | PRN

## 2014-03-10 SURGICAL SUPPLY — 59 items
BAG DECANTER FOR FLEXI CONT (MISCELLANEOUS) ×2 IMPLANT
BENZOIN TINCTURE PRP APPL 2/3 (GAUZE/BANDAGES/DRESSINGS) ×2 IMPLANT
BIT DRILL TRINICA 2.3MM (BIT) ×1 IMPLANT
BRUSH SCRUB EZ PLAIN DRY (MISCELLANEOUS) ×2 IMPLANT
BUR MATCHSTICK NEURO 3.0 LAGG (BURR) ×2 IMPLANT
CANISTER SUCT 3000ML (MISCELLANEOUS) ×2 IMPLANT
CONT SPEC 4OZ CLIKSEAL STRL BL (MISCELLANEOUS) ×2 IMPLANT
DRAPE C-ARM 42X72 X-RAY (DRAPES) ×4 IMPLANT
DRAPE LAPAROTOMY 100X72 PEDS (DRAPES) ×2 IMPLANT
DRAPE MICROSCOPE LEICA (MISCELLANEOUS) ×2 IMPLANT
DRAPE POUCH INSTRU U-SHP 10X18 (DRAPES) ×2 IMPLANT
DRAPE SURG 17X23 STRL (DRAPES) ×4 IMPLANT
DRILL BIT TRINICA 2.3MM (BIT) ×2
DRSG OPSITE POSTOP 3X4 (GAUZE/BANDAGES/DRESSINGS) ×2 IMPLANT
DRSG TELFA 3X8 NADH (GAUZE/BANDAGES/DRESSINGS) ×2 IMPLANT
DURAPREP 6ML APPLICATOR 50/CS (WOUND CARE) ×2 IMPLANT
ELECT COATED BLADE 2.86 ST (ELECTRODE) ×2 IMPLANT
ELECT REM PT RETURN 9FT ADLT (ELECTROSURGICAL) ×2
ELECTRODE REM PT RTRN 9FT ADLT (ELECTROSURGICAL) ×1 IMPLANT
GAUZE SPONGE 4X4 12PLY STRL (GAUZE/BANDAGES/DRESSINGS) ×2 IMPLANT
GAUZE SPONGE 4X4 16PLY XRAY LF (GAUZE/BANDAGES/DRESSINGS) IMPLANT
GLOVE BIOGEL PI IND STRL 7.0 (GLOVE) ×2 IMPLANT
GLOVE BIOGEL PI INDICATOR 7.0 (GLOVE) ×2
GLOVE ECLIPSE 8.0 STRL XLNG CF (GLOVE) ×2 IMPLANT
GLOVE ECLIPSE 9.0 STRL (GLOVE) ×2 IMPLANT
GLOVE EXAM NITRILE LRG STRL (GLOVE) IMPLANT
GLOVE EXAM NITRILE XL STR (GLOVE) IMPLANT
GLOVE EXAM NITRILE XS STR PU (GLOVE) IMPLANT
GLOVE SURG SS PI 7.0 STRL IVOR (GLOVE) ×6 IMPLANT
GOWN STRL REUS W/ TWL LRG LVL3 (GOWN DISPOSABLE) IMPLANT
GOWN STRL REUS W/ TWL XL LVL3 (GOWN DISPOSABLE) ×4 IMPLANT
GOWN STRL REUS W/TWL 2XL LVL3 (GOWN DISPOSABLE) IMPLANT
GOWN STRL REUS W/TWL LRG LVL3 (GOWN DISPOSABLE)
GOWN STRL REUS W/TWL XL LVL3 (GOWN DISPOSABLE) ×4
HALTER HD/CHIN CERV TRACTION D (MISCELLANEOUS) ×2 IMPLANT
HEMOSTAT POWDER SURGIFOAM 1G (HEMOSTASIS) ×2 IMPLANT
KIT BASIN OR (CUSTOM PROCEDURE TRAY) ×2 IMPLANT
KIT ROOM TURNOVER OR (KITS) ×2 IMPLANT
NEEDLE SPNL 20GX3.5 QUINCKE YW (NEEDLE) ×2 IMPLANT
NS IRRIG 1000ML POUR BTL (IV SOLUTION) ×2 IMPLANT
PACK LAMINECTOMY NEURO (CUSTOM PROCEDURE TRAY) ×2 IMPLANT
PAD ARMBOARD 7.5X6 YLW CONV (MISCELLANEOUS) ×4 IMPLANT
PATTIES SURGICAL .25X.25 (GAUZE/BANDAGES/DRESSINGS) IMPLANT
PATTIES SURGICAL .75X.75 (GAUZE/BANDAGES/DRESSINGS) IMPLANT
PLATE 22MM (Plate) ×2 IMPLANT
PUTTY BONE GRAFT KIT 2.5ML (Bone Implant) ×2 IMPLANT
RUBBERBAND STERILE (MISCELLANEOUS) ×4 IMPLANT
SCREW SD FIXED 12MM (Screw) ×8 IMPLANT
SPACER TMS 11X14X6MM (Spacer) ×2 IMPLANT
SPONGE INTESTINAL PEANUT (DISPOSABLE) ×2 IMPLANT
SPONGE SURGIFOAM ABS GEL SZ50 (HEMOSTASIS) ×2 IMPLANT
STRIP CLOSURE SKIN 1/2X4 (GAUZE/BANDAGES/DRESSINGS) ×2 IMPLANT
SUT PDS AB 5-0 P3 18 (SUTURE) ×2 IMPLANT
SUT VIC AB 3-0 CP2 18 (SUTURE) ×2 IMPLANT
SYR 20ML ECCENTRIC (SYRINGE) ×2 IMPLANT
TOWEL OR 17X24 6PK STRL BLUE (TOWEL DISPOSABLE) ×2 IMPLANT
TOWEL OR 17X26 10 PK STRL BLUE (TOWEL DISPOSABLE) ×2 IMPLANT
TRAP SPECIMEN MUCOUS 40CC (MISCELLANEOUS) IMPLANT
WATER STERILE IRR 1000ML POUR (IV SOLUTION) ×2 IMPLANT

## 2014-03-10 NOTE — Progress Notes (Signed)
ANTIBIOTIC CONSULT NOTE - INITIAL  Pharmacy Consult for vancomycin Indication: surgical prophylaxis  Allergies  Allergen Reactions  . Penicillins Swelling and Rash    "facial swelling"  . Sulfa Antibiotics Rash    Patient Measurements: Weight: 174 lb 3.2 oz (79.017 kg) Adjusted Body Weight:   Vital Signs: Temp: 96.8 F (36 C) (11/06 1021) Temp Source: Oral (11/06 0628) BP: 115/61 mmHg (11/06 0953) Pulse Rate: 76 (11/06 1021) Intake/Output from previous day:   Intake/Output from this shift: Total I/O In: 1000 [I.V.:1000] Out: 25 [Blood:25]  Labs:  Recent Labs  03/08/14 1524  WBC 6.9  HGB 11.0*  PLT 190  CREATININE 0.73   Estimated Creatinine Clearance: 73.8 mL/min (by C-G formula based on Cr of 0.73). No results for input(s): VANCOTROUGH, VANCOPEAK, VANCORANDOM, GENTTROUGH, GENTPEAK, GENTRANDOM, TOBRATROUGH, TOBRAPEAK, TOBRARND, AMIKACINPEAK, AMIKACINTROU, AMIKACIN in the last 72 hours.   Microbiology: Recent Results (from the past 720 hour(s))  Surgical pcr screen     Status: None   Collection Time: 03/08/14  3:24 PM  Result Value Ref Range Status   MRSA, PCR NEGATIVE NEGATIVE Final   Staphylococcus aureus NEGATIVE NEGATIVE Final    Comment:        The Xpert SA Assay (FDA approved for NASAL specimens in patients over 34 years of age), is one component of a comprehensive surveillance program.  Test performance has been validated by [employer] for patients greater than or equal to 70 year old. It is not intended to diagnose infection nor to guide or monitor treatment.     Medical History: Past Medical History  Diagnosis Date  . Arthritis     ra  . Anxiety     Medications:  Scheduled:  . dexamethasone  4 mg Intravenous 4 times per day   Or  . dexamethasone  4 mg Oral 4 times per day  . DULoxetine  60 mg Oral Daily  . fentaNYL      . leflunomide  20 mg Oral Daily  . predniSONE  5 mg Oral Q breakfast  . rOPINIRole  1 mg Oral QHS  . sodium  chloride  3 mL Intravenous Q12H   Infusions:  . dextrose 5 % and 0.45 % NaCl with KCl 20 mEq/L     Assessment: 63 yo female s/p neurosurgery will be put on vancomycin for surgical prophylaxis.  Patient received one dose of vancomycin 1.25 g iv x1 at 0755 today. Per RN, patient doesn't have a drain.  Goal of Therapy:  Vancomycin trough 10-15  Plan:  - vancomycin 1g iv x1 at 2000 tonight. Will sign off.   Yehya Brendle, Tsz-Yin 03/10/2014,11:03 AM

## 2014-03-10 NOTE — Progress Notes (Signed)
Utilization review completed.  

## 2014-03-10 NOTE — Op Note (Signed)
Preoperative diagnosis: Spondylolisthesis C5-6 with spinal stenosis Postop diagnosis: Same Procedure: C5-6 decompressive anterior cervical discectomy with trabecular metal fusion and Trinica anterior cervical plating Surgeon: Calandra Madura Asst.: Pool  After and placed in the supine position and 10 pounds halter traction the patient's neck was prepped and draped in the usual sterile fashion. Localizing fluoroscopy was used prior to incision to identify the appropriate level. Transverse incision was made in the right anterior neck started the midline and headed towards the medial aspect of the sternocleidomastoid muscle. The platysma muscle was then incised transversely and the natural fascial plane between the strap muscles medially and the sternal cremaster laterally was identified and followed down to the cervical spine. The longus coli muscles were identified split in the midline to play bilaterally with unipolar coagulation and Kitner dissection. Subcutaneous tract was placed for exposure and x-ray showed approach the appropriate level. Using a 15 blade the end of the disc at C5-6 was incised. Using pituitary rongeurs and curettes approximately 90% of the disc material was removed. High-speed drill was used to widen the interspace and bony shavings were saved for use later in the case. At this time the microscope was draped brought into the field and used for the remainder of the case. Using microdissection technique the remainder of the disc material down the posterior longitudinal ligament was removed. Ligament was then incised transversely and the cut edges removed a Kerrison punch. Thorough decompression was carried out on the spinal dura into the foramen bilaterally until the C6 nerve roots were well visualized well decompressed. At this time inspection was carried out once more for any evidence of residual compression and none could be identified. Irrigation was carried out any bleeding control proper  coagulation Gelfoam and Surgifoam. Measurements were taken and a 6 mm lordotic trabecular metal graft was chosen and filled with a mixture of autologous bone morselized allograft. After to her confirming hemostasis once more the plug was impacted without difficulty and fossae showed to be in excellent position. An appropriately length Trinica anterior cervical plate was then chosen. Under fluoroscopic guidance drill holes were placed followed by placing 12 mm screws 4. Locking mechanism was rotated locked position and final fossae showed good position of the plates screws and graft. Irrigation was carried out and any bleeding control proper coagulation. The was then closed with inverted Vicryl on the platysma muscle and subcuticular layer. Steri-Strips were placed on the skin. A sterile dressing was then applied and the patient was extubated and taken to recovery room in stable condition.

## 2014-03-10 NOTE — Anesthesia Postprocedure Evaluation (Signed)
  Anesthesia Post-op Note  Patient: Marilyn Perez  Procedure(s) Performed: Procedure(s): Cervical five-six Anterior Cervical Discectomy with  Fusion and Plating  (N/A)  Patient Location: PACU  Anesthesia Type:General  Level of Consciousness: awake and alert   Airway and Oxygen Therapy: Patient Spontanous Breathing and Patient connected to nasal cannula oxygen  Post-op Pain: mild  Post-op Assessment: Post-op Vital signs reviewed, Patient's Cardiovascular Status Stable, Respiratory Function Stable, Patent Airway and No signs of Nausea or vomiting  Post-op Vital Signs: Reviewed and stable  Last Vitals:  Filed Vitals:   03/10/14 0940  BP: 121/55  Pulse: 80  Temp: 36.8 C  Resp: 36    Complications: No apparent anesthesia complications

## 2014-03-10 NOTE — Plan of Care (Signed)
Problem: Consults Goal: Diagnosis - Spinal Surgery Outcome: Completed/Met Date Met:  03/10/14 Cervical Spine Fusion

## 2014-03-10 NOTE — Plan of Care (Signed)
Problem: Consults Goal: Spinal Surgery Patient Education See Patient Education Module for education specifics. Outcome: Completed/Met Date Met:  03/10/14

## 2014-03-10 NOTE — Progress Notes (Signed)
Patient alert and oriented, mae's well, voiding adequate amount of urine, swallowing without difficulty, no c/o pain. Patient discharged home with family. Script and discharged instructions given to patient. Patient and family stated understanding of d/c instructions given and has an appointment with MD. Aisha Lum Stillinger RN 

## 2014-03-10 NOTE — Anesthesia Procedure Notes (Signed)
Procedure Name: Intubation Date/Time: 03/10/2014 7:50 AM Performed by: Gaylene Brooks Pre-anesthesia Checklist: Timeout performed, Patient identified, Emergency Drugs available, Suction available and Patient being monitored Patient Re-evaluated:Patient Re-evaluated prior to inductionOxygen Delivery Method: Circle system utilized Preoxygenation: Pre-oxygenation with 100% oxygen Intubation Type: IV induction Ventilation: Mask ventilation without difficulty Laryngoscope Size: Miller and 2 Grade View: Grade I Tube type: Oral Tube size: 7.0 mm Number of attempts: 1 Airway Equipment and Method: Stylet Placement Confirmation: ETT inserted through vocal cords under direct vision,  breath sounds checked- equal and bilateral,  positive ETCO2 and CO2 detector Secured at: 21 cm Tube secured with: Tape Dental Injury: Teeth and Oropharynx as per pre-operative assessment

## 2014-03-10 NOTE — Discharge Summary (Signed)
  Physician Discharge Summary  Patient ID: Marilyn Perez MRN: [id number] DOB/AGE: 12-30-1950 63 y.o.  Admit date: 03/10/2014 Discharge date: 03/10/2014  Admission Diagnoses:  Discharge Diagnoses:  Active Problems:   Cervical stenosis of spinal canal   Discharged Condition: good  Hospital Course: Surgery earlier today for C 56 acdf. Did well. Pain much better post op. Wound clean and dry. Home same day with specific instructions given.  Consults: None  Significant Diagnostic Studies: none  Treatments: surgery: C 56 acdf with plating  Discharge Exam: Blood pressure 149/80, pulse 89, temperature 98.3 F (36.8 C), resp. rate 20, weight 79.017 kg (174 lb 3.2 oz), SpO2 97 %. Incision/Wound:clean and dry; no new neuro issues  Disposition:      Medication List    ASK your doctor about these medications        DULoxetine 60 MG capsule  Commonly known as:  CYMBALTA  Take 60 mg by mouth daily.     leflunomide 20 MG tablet  Commonly known as:  ARAVA  Take 20 mg by mouth daily.     ORENCIA IV  Inject into the vein every 30 (thirty) days.     predniSONE 5 MG tablet  Commonly known as:  DELTASONE  Take 5 mg by mouth daily with breakfast.     rOPINIRole 0.5 MG tablet  Commonly known as:  REQUIP  Take 1 mg by mouth at bedtime.     VITAMIN B-12 IJ  Inject as directed every 30 (thirty) days.         At home rest most of the time. Get up 9 or 10 times each day and take a 15 or 20 minute walk. No riding in the car and to your first postoperative appointment. If you have neck surgery you may shower from the chest down starting on the third postoperative day. If you had back surgery he may start showering on the third postoperative day with saran wrap wrapped around your incisional area 3 times. After the shower remove the saran wrap. Take pain medicine as needed and other medications as instructed. Call my office for an appointment.  SignedFaythe Ghee,  MD 03/10/2014, 4:14 PM

## 2014-03-10 NOTE — Anesthesia Preprocedure Evaluation (Addendum)
Anesthesia Evaluation  Patient identified by MRN, date of birth, ID band Patient awake    Reviewed: Allergy & Precautions, H&P , NPO status , Patient's Chart, lab work & pertinent test results  Airway Mallampati: II  TM Distance: >3 FB Neck ROM: Full    Dental  (+) Teeth Intact, Dental Advisory Given, Caps   Pulmonary Current Smoker (1/2 ppd x 5 years),  breath sounds clear to auscultation        Cardiovascular Rhythm:Regular     Neuro/Psych    GI/Hepatic   Endo/Other    Renal/GU      Musculoskeletal   Abdominal (+)  Abdomen: soft.    Peds  Hematology   Anesthesia Other Findings   Reproductive/Obstetrics                           Anesthesia Physical Anesthesia Plan  ASA: II  Anesthesia Plan: General   Post-op Pain Management:    Induction:   Airway Management Planned: Oral ETT and Video Laryngoscope Planned  Additional Equipment:   Intra-op Plan:   Post-operative Plan: Extubation in OR  Informed Consent: I have reviewed the patients History and Physical, chart, labs and discussed the procedure including the risks, benefits and alternatives for the proposed anesthesia with the patient or authorized representative who has indicated his/her understanding and acceptance.     Plan Discussed with:   Anesthesia Plan Comments: (Get EKG if it does not delay start)        Anesthesia Quick Evaluation

## 2014-03-10 NOTE — H&P (Signed)
Marilyn Perez is an 63 y.o. female.   Chief Complaint: cervical instability HPI: the patient is a 63 year old female who had an anterior cervical discectomy at C6-7 years ago. She did well at that time but recently presented with neck pain going to the arms with numbness particularly on the right. She was tried on aggressive conservative therapy which gave her no relief. An MRI scan showed good fusion at C6-7 but a retrolisthesis at C5-6 with marked foraminal encroachment. After failing further conservative therapy the patient requested surgery and now comes for a C5-6 anterior cervical discectomy with fusion and plating. I have had a long discussion with her regarding the risks and benefits of surgical intervention. The risks discussed include but are not limited to bleeding infection weakness numbness paralysis spinal fluid leak coma quadriplegia hoarseness and death. We have discussed alternative methods of therapy along with the risks and benefits of nonintervention. She's had the opportunity to ask numerous questions and appears to understand. With this information in hand she has requested that we proceed with surgery.  Past Medical History  Diagnosis Date  . Arthritis     ra  . Anxiety     Past Surgical History  Procedure Laterality Date  . Abdominal hysterectomy    . Back surgery    . Carpal tunnel release    . Eye surgery      History reviewed. No pertinent family history. Social History:  reports that she has been smoking.  She has never used smokeless tobacco. She reports that she does not drink alcohol or use illicit drugs.  Allergies:  Allergies  Allergen Reactions  . Penicillins Swelling and Rash    "facial swelling"  . Sulfa Antibiotics Rash    Medications Prior to Admission  Medication Sig Dispense Refill  . Abatacept (ORENCIA IV) Inject into the vein every 30 (thirty) days.    . Cyanocobalamin (VITAMIN B-12 IJ) Inject as directed every 30 (thirty) days.    .  DULoxetine (CYMBALTA) 60 MG capsule Take 60 mg by mouth daily.    Marland Kitchen leflunomide (ARAVA) 20 MG tablet Take 20 mg by mouth daily.    . predniSONE (DELTASONE) 5 MG tablet Take 5 mg by mouth daily with breakfast.    . rOPINIRole (REQUIP) 0.5 MG tablet Take 1 mg by mouth at bedtime.      Results for orders placed or performed during the hospital encounter of 03/08/14 (from the past 48 hour(s))  CBC     Status: Abnormal   Collection Time: 03/08/14  3:24 PM  Result Value Ref Range   WBC 6.9 4.0 - 10.5 K/uL   RBC 3.60 (L) 3.87 - 5.11 MIL/uL   Hemoglobin 11.0 (L) 12.0 - 15.0 g/dL   HCT 33.7 (L) 36.0 - 46.0 %   MCV 93.6 78.0 - 100.0 fL   MCH 30.6 26.0 - 34.0 pg   MCHC 32.6 30.0 - 36.0 g/dL   RDW 13.9 11.5 - 15.5 %   Platelets 190 150 - 400 K/uL  Basic metabolic panel     Status: Abnormal   Collection Time: 03/08/14  3:24 PM  Result Value Ref Range   Sodium 139 137 - 147 mEq/L   Potassium 4.2 3.7 - 5.3 mEq/L   Chloride 105 96 - 112 mEq/L   CO2 24 19 - 32 mEq/L   Glucose, Bld 136 (H) 70 - 99 mg/dL   BUN 13 6 - 23 mg/dL   Creatinine, Ser 0.73 0.50 - 1.10 mg/dL  Calcium 8.9 8.4 - 10.5 mg/dL   GFR calc non Af Amer 88 (L) >90 mL/min   GFR calc Af Amer >90 >90 mL/min    Comment: (NOTE) The eGFR has been calculated using the CKD EPI equation. This calculation has not been validated in all clinical situations. eGFR's persistently <90 mL/min signify possible Chronic Kidney Disease.    Anion gap 10 5 - 15  Surgical pcr screen     Status: None   Collection Time: 03/08/14  3:24 PM  Result Value Ref Range   MRSA, PCR NEGATIVE NEGATIVE   Staphylococcus aureus NEGATIVE NEGATIVE    Comment:        The Xpert SA Assay (FDA approved for NASAL specimens in patients over 86 years of age), is one component of a comprehensive surveillance program.  Test performance has been validated by [employer] for patients greater than or equal to 47 year old. It is not intended to diagnose infection nor  to guide or monitor treatment.    No results found.  A comprehensive review of systems was negative.  Blood pressure 121/59, pulse 83, temperature 98.8 F (37.1 C), resp. rate 18, weight 79.017 kg (174 lb 3.2 oz), SpO2 97 %.  the patient is awake alert and oriented. She has no facial asymmetry. Her gait is nonantalgic. We flexes are decreased but equal. Her strength is intact. Assessment/Plan Impression is that of listhesis at C5-6. The plan is for a C5-6 anterior cervical discectomy with fusion and plating.  Faythe Ghee, MD 03/10/2014, 7:34 AM

## 2014-03-10 NOTE — Transfer of Care (Signed)
Immediate Anesthesia Transfer of Care Note  Patient: Marilyn Perez  Procedure(s) Performed: Procedure(s): Cervical five-six Anterior Cervical Discectomy with  Fusion and Plating  (N/A)  Patient Location: PACU  Anesthesia Type:General  Level of Consciousness: awake, alert  and oriented  Airway & Oxygen Therapy: Patient Spontanous Breathing and Patient connected to nasal cannula oxygen  Post-op Assessment: Report given to PACU RN, Post -op Vital signs reviewed and stable and Patient moving all extremities X 4  Post vital signs: Reviewed and stable  Complications: No apparent anesthesia complications

## 2014-03-14 ENCOUNTER — Encounter (HOSPITAL_COMMUNITY): Payer: Self-pay | Admitting: Neurosurgery

## 2014-03-28 DIAGNOSIS — M5412 Radiculopathy, cervical region: Secondary | ICD-10-CM | POA: Diagnosis not present

## 2014-03-28 DIAGNOSIS — M4312 Spondylolisthesis, cervical region: Secondary | ICD-10-CM | POA: Diagnosis not present

## 2014-03-28 DIAGNOSIS — Z6827 Body mass index (BMI) 27.0-27.9, adult: Secondary | ICD-10-CM | POA: Diagnosis not present

## 2014-04-04 DIAGNOSIS — M0589 Other rheumatoid arthritis with rheumatoid factor of multiple sites: Secondary | ICD-10-CM | POA: Diagnosis not present

## 2014-04-10 DIAGNOSIS — Z79899 Other long term (current) drug therapy: Secondary | ICD-10-CM | POA: Diagnosis not present

## 2014-04-10 DIAGNOSIS — M2550 Pain in unspecified joint: Secondary | ICD-10-CM | POA: Diagnosis not present

## 2014-04-10 DIAGNOSIS — M1990 Unspecified osteoarthritis, unspecified site: Secondary | ICD-10-CM | POA: Diagnosis not present

## 2014-04-10 DIAGNOSIS — M0609 Rheumatoid arthritis without rheumatoid factor, multiple sites: Secondary | ICD-10-CM | POA: Diagnosis not present

## 2014-04-18 DIAGNOSIS — Z23 Encounter for immunization: Secondary | ICD-10-CM | POA: Diagnosis not present

## 2014-04-18 DIAGNOSIS — Z0000 Encounter for general adult medical examination without abnormal findings: Secondary | ICD-10-CM | POA: Diagnosis not present

## 2014-04-18 DIAGNOSIS — M8580 Other specified disorders of bone density and structure, unspecified site: Secondary | ICD-10-CM | POA: Diagnosis not present

## 2014-04-18 DIAGNOSIS — M069 Rheumatoid arthritis, unspecified: Secondary | ICD-10-CM | POA: Diagnosis not present

## 2014-04-18 DIAGNOSIS — F329 Major depressive disorder, single episode, unspecified: Secondary | ICD-10-CM | POA: Diagnosis not present

## 2014-04-18 DIAGNOSIS — Z1389 Encounter for screening for other disorder: Secondary | ICD-10-CM | POA: Diagnosis not present

## 2014-04-18 DIAGNOSIS — E538 Deficiency of other specified B group vitamins: Secondary | ICD-10-CM | POA: Diagnosis not present

## 2014-04-18 DIAGNOSIS — E785 Hyperlipidemia, unspecified: Secondary | ICD-10-CM | POA: Diagnosis not present

## 2014-05-02 DIAGNOSIS — M0589 Other rheumatoid arthritis with rheumatoid factor of multiple sites: Secondary | ICD-10-CM | POA: Diagnosis not present

## 2014-05-22 DIAGNOSIS — E538 Deficiency of other specified B group vitamins: Secondary | ICD-10-CM | POA: Diagnosis not present

## 2014-05-30 DIAGNOSIS — M0609 Rheumatoid arthritis without rheumatoid factor, multiple sites: Secondary | ICD-10-CM | POA: Diagnosis not present

## 2014-06-22 DIAGNOSIS — E538 Deficiency of other specified B group vitamins: Secondary | ICD-10-CM | POA: Diagnosis not present

## 2014-06-27 DIAGNOSIS — M0609 Rheumatoid arthritis without rheumatoid factor, multiple sites: Secondary | ICD-10-CM | POA: Diagnosis not present

## 2014-06-27 DIAGNOSIS — M0589 Other rheumatoid arthritis with rheumatoid factor of multiple sites: Secondary | ICD-10-CM | POA: Diagnosis not present

## 2014-07-24 DIAGNOSIS — E538 Deficiency of other specified B group vitamins: Secondary | ICD-10-CM | POA: Diagnosis not present

## 2014-07-27 DIAGNOSIS — M0589 Other rheumatoid arthritis with rheumatoid factor of multiple sites: Secondary | ICD-10-CM | POA: Diagnosis not present

## 2014-08-02 DIAGNOSIS — J209 Acute bronchitis, unspecified: Secondary | ICD-10-CM | POA: Diagnosis not present

## 2014-08-02 DIAGNOSIS — R0602 Shortness of breath: Secondary | ICD-10-CM | POA: Diagnosis not present

## 2014-08-15 DIAGNOSIS — Z79899 Other long term (current) drug therapy: Secondary | ICD-10-CM | POA: Diagnosis not present

## 2014-08-15 DIAGNOSIS — M0609 Rheumatoid arthritis without rheumatoid factor, multiple sites: Secondary | ICD-10-CM | POA: Diagnosis not present

## 2014-08-15 DIAGNOSIS — M150 Primary generalized (osteo)arthritis: Secondary | ICD-10-CM | POA: Diagnosis not present

## 2014-08-15 DIAGNOSIS — M2550 Pain in unspecified joint: Secondary | ICD-10-CM | POA: Diagnosis not present

## 2014-08-24 DIAGNOSIS — M0589 Other rheumatoid arthritis with rheumatoid factor of multiple sites: Secondary | ICD-10-CM | POA: Diagnosis not present

## 2014-09-21 DIAGNOSIS — M0609 Rheumatoid arthritis without rheumatoid factor, multiple sites: Secondary | ICD-10-CM | POA: Diagnosis not present

## 2014-09-29 DIAGNOSIS — E538 Deficiency of other specified B group vitamins: Secondary | ICD-10-CM | POA: Diagnosis not present

## 2014-10-19 DIAGNOSIS — M0609 Rheumatoid arthritis without rheumatoid factor, multiple sites: Secondary | ICD-10-CM | POA: Diagnosis not present

## 2014-10-20 ENCOUNTER — Observation Stay (HOSPITAL_COMMUNITY)
Admission: EM | Admit: 2014-10-20 | Discharge: 2014-10-21 | Disposition: A | Discharge status: Home / Self Care | Payer: Medicare Other | Attending: Cardiovascular Disease | Admitting: Cardiovascular Disease

## 2014-10-20 ENCOUNTER — Emergency Department (HOSPITAL_COMMUNITY): Payer: Medicare Other

## 2014-10-20 ENCOUNTER — Encounter (HOSPITAL_COMMUNITY): Payer: Self-pay | Admitting: Physical Medicine and Rehabilitation

## 2014-10-20 DIAGNOSIS — R5383 Other fatigue: Secondary | ICD-10-CM

## 2014-10-20 DIAGNOSIS — M79602 Pain in left arm: Secondary | ICD-10-CM

## 2014-10-20 DIAGNOSIS — I200 Unstable angina: Secondary | ICD-10-CM

## 2014-10-20 DIAGNOSIS — F419 Anxiety disorder, unspecified: Secondary | ICD-10-CM | POA: Insufficient documentation

## 2014-10-20 DIAGNOSIS — Z7952 Long term (current) use of systemic steroids: Secondary | ICD-10-CM | POA: Diagnosis not present

## 2014-10-20 DIAGNOSIS — Z720 Tobacco use: Secondary | ICD-10-CM

## 2014-10-20 DIAGNOSIS — R079 Chest pain, unspecified: Principal | ICD-10-CM | POA: Insufficient documentation

## 2014-10-20 DIAGNOSIS — Z79899 Other long term (current) drug therapy: Secondary | ICD-10-CM | POA: Diagnosis not present

## 2014-10-20 DIAGNOSIS — R110 Nausea: Secondary | ICD-10-CM | POA: Insufficient documentation

## 2014-10-20 DIAGNOSIS — Z880 Allergy status to penicillin: Secondary | ICD-10-CM | POA: Diagnosis not present

## 2014-10-20 DIAGNOSIS — R000 Tachycardia, unspecified: Secondary | ICD-10-CM | POA: Diagnosis not present

## 2014-10-20 DIAGNOSIS — R0602 Shortness of breath: Secondary | ICD-10-CM | POA: Diagnosis not present

## 2014-10-20 DIAGNOSIS — R0789 Other chest pain: Secondary | ICD-10-CM | POA: Diagnosis not present

## 2014-10-20 DIAGNOSIS — E785 Hyperlipidemia, unspecified: Secondary | ICD-10-CM

## 2014-10-20 DIAGNOSIS — F17200 Nicotine dependence, unspecified, uncomplicated: Secondary | ICD-10-CM | POA: Diagnosis present

## 2014-10-20 DIAGNOSIS — M1990 Unspecified osteoarthritis, unspecified site: Secondary | ICD-10-CM | POA: Diagnosis not present

## 2014-10-20 HISTORY — DX: Hyperlipidemia, unspecified: E78.5

## 2014-10-20 LAB — CBC WITH DIFFERENTIAL/PLATELET
BASOS PCT: 0 % (ref 0–1)
Basophils Absolute: 0 10*3/uL (ref 0.0–0.1)
Eosinophils Absolute: 0.1 10*3/uL (ref 0.0–0.7)
Eosinophils Relative: 3 % (ref 0–5)
HEMATOCRIT: 38.7 % (ref 36.0–46.0)
Hemoglobin: 12.8 g/dL (ref 12.0–15.0)
LYMPHS ABS: 1.5 10*3/uL (ref 0.7–4.0)
LYMPHS PCT: 33 % (ref 12–46)
MCH: 30.8 pg (ref 26.0–34.0)
MCHC: 33.1 g/dL (ref 30.0–36.0)
MCV: 93.3 fL (ref 78.0–100.0)
MONOS PCT: 9 % (ref 3–12)
Monocytes Absolute: 0.4 10*3/uL (ref 0.1–1.0)
NEUTROS ABS: 2.5 10*3/uL (ref 1.7–7.7)
NEUTROS PCT: 55 % (ref 43–77)
Platelets: 171 10*3/uL (ref 150–400)
RBC: 4.15 MIL/uL (ref 3.87–5.11)
RDW: 13.9 % (ref 11.5–15.5)
WBC: 4.6 10*3/uL (ref 4.0–10.5)

## 2014-10-20 LAB — COMPREHENSIVE METABOLIC PANEL
ALT: 15 U/L (ref 14–54)
AST: 21 U/L (ref 15–41)
Albumin: 3.6 g/dL (ref 3.5–5.0)
Alkaline Phosphatase: 72 U/L (ref 38–126)
Anion gap: 8 (ref 5–15)
BUN: 8 mg/dL (ref 6–20)
CO2: 25 mmol/L (ref 22–32)
Calcium: 8.9 mg/dL (ref 8.9–10.3)
Chloride: 108 mmol/L (ref 101–111)
Creatinine, Ser: 0.86 mg/dL (ref 0.44–1.00)
GFR calc non Af Amer: >60 mL/min (ref >60)
Glucose, Bld: 147 mg/dL — ABNORMAL HIGH (ref 65–99)
Potassium: 3.3 mmol/L — ABNORMAL LOW (ref 3.5–5.1)
Sodium: 141 mmol/L (ref 135–145)
TOTAL PROTEIN: 6.5 g/dL (ref 6.5–8.1)
Total Bilirubin: 0.8 mg/dL (ref 0.3–1.2)

## 2014-10-20 LAB — TROPONIN I: Troponin I: <0.03 ng/mL (ref <0.031)

## 2014-10-20 LAB — I-STAT TROPONIN, ED: Troponin i, poc: 0 ng/mL (ref 0.00–0.08)

## 2014-10-20 IMAGING — CR DG CHEST 2V
2 series · 2 of 2 positions shown · non-contrast
Comparison: None.

CLINICAL DATA: Left-sided chest pain for 3 days, shortness of
breath

EXAM:
CHEST  2 VIEW

[chest pa]
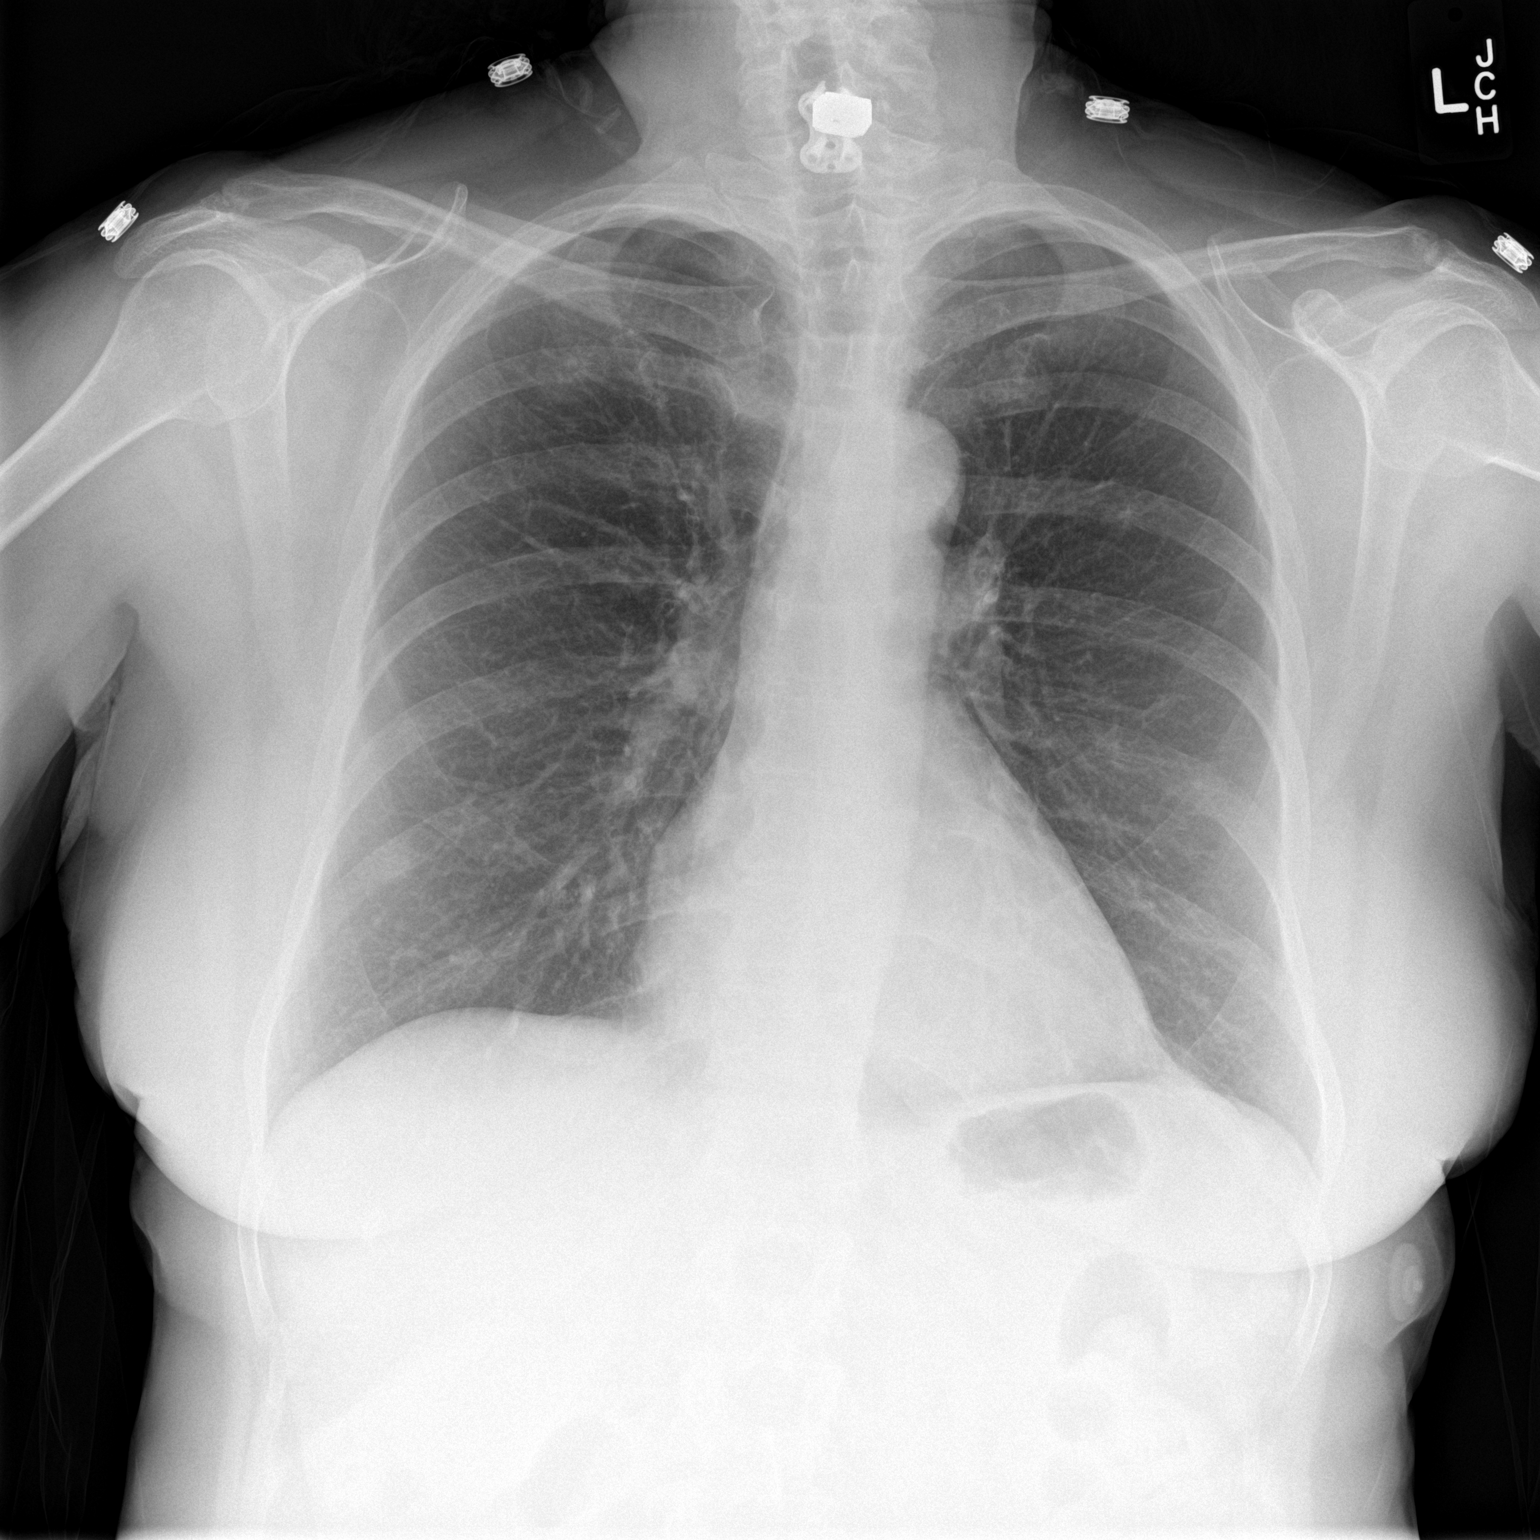

[chest lat]
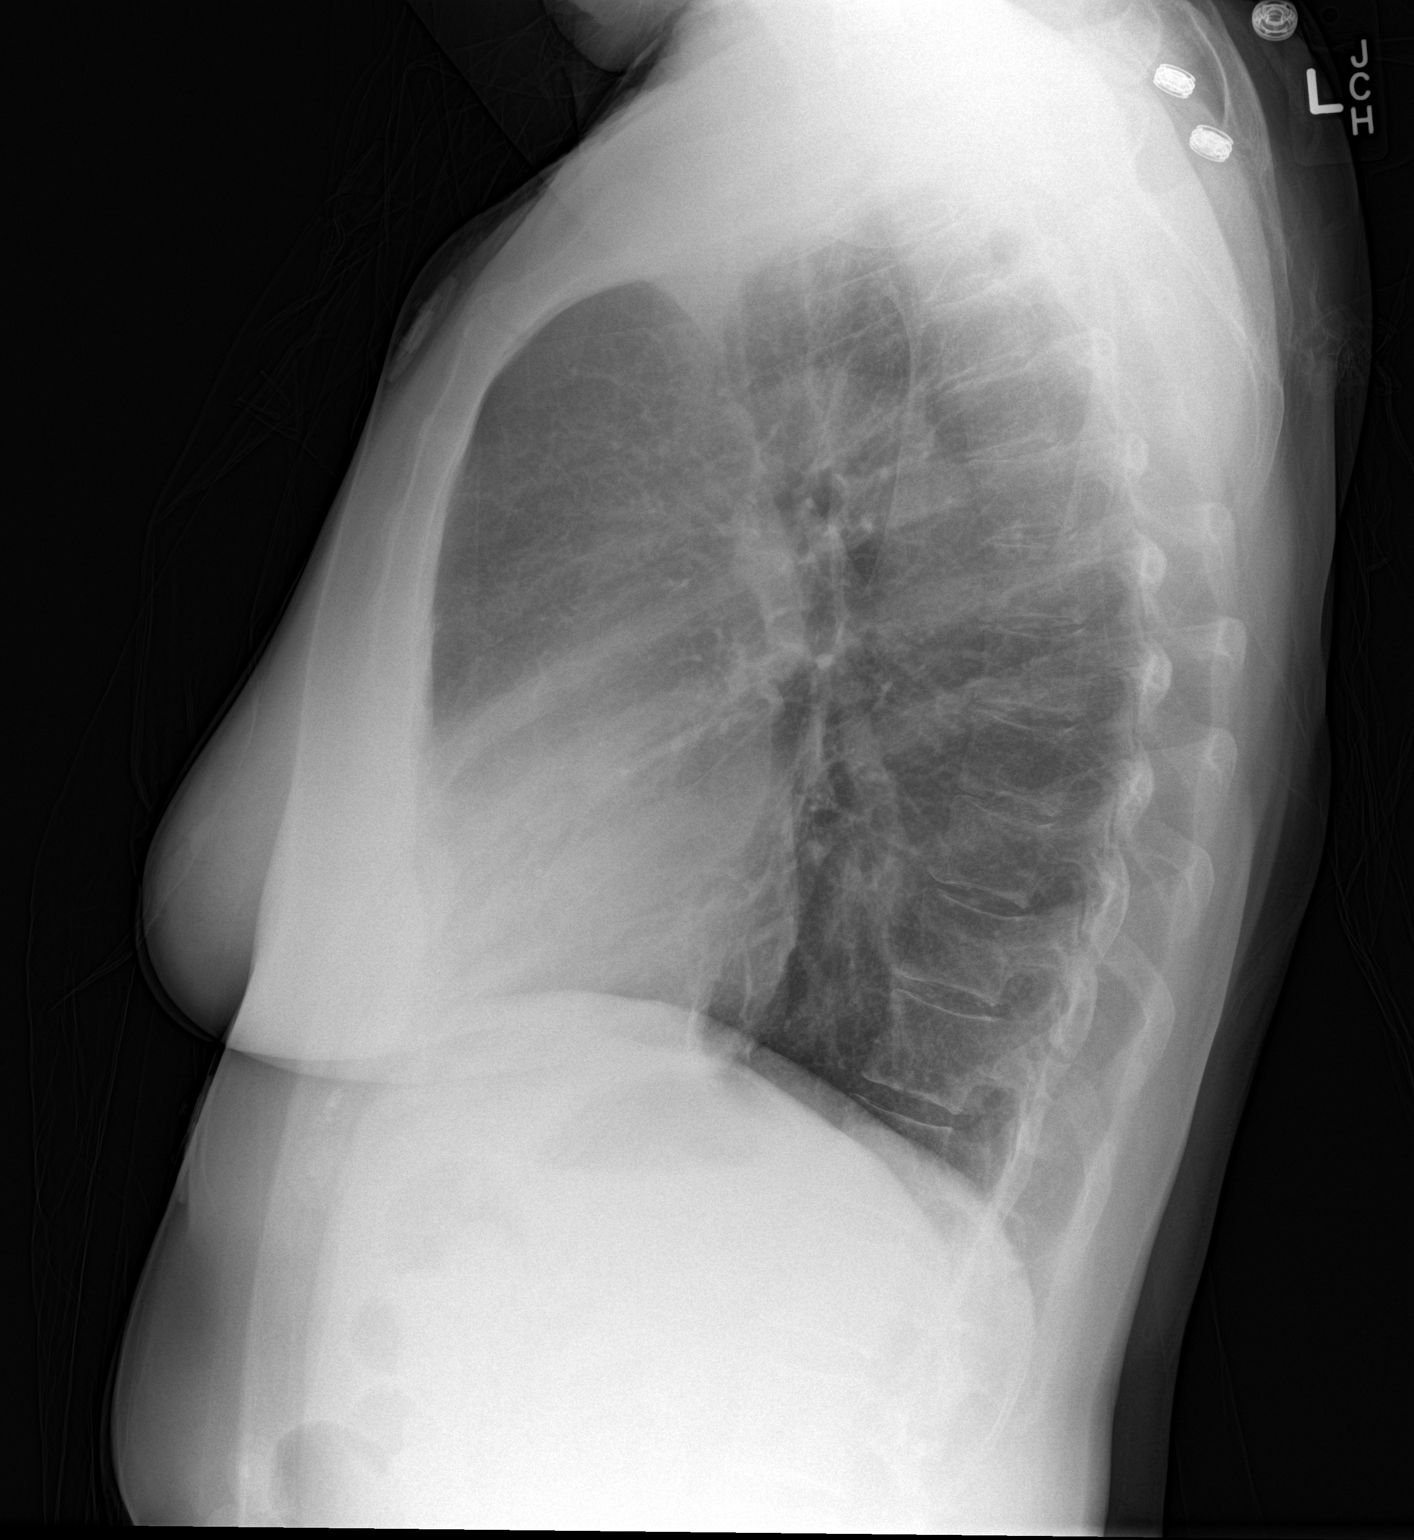

[2 of 2 positions shown; findings below may reference images not displayed]

FINDINGS: There is no focal parenchymal opacity. There is no pleural effusion
or pneumothorax. The heart and mediastinal contours are
unremarkable.

There is anterior cervical fusion at C5-6.
IMPRESSION: No active cardiopulmonary disease.

## 2014-10-20 MED ORDER — PREDNISONE 5 MG PO TABS
5.0000 mg | ORAL_TABLET | Freq: Every day | ORAL | Status: DC
  Administered 2014-10-21: 5 mg via ORAL
  Filled 2014-10-20: qty 1

## 2014-10-20 MED ORDER — NITROGLYCERIN 0.4 MG SL SUBL: 0.4000 mg | SUBLINGUAL_TABLET | SUBLINGUAL | Status: DC | PRN

## 2014-10-20 MED ORDER — NITROGLYCERIN 0.4 MG SL SUBL
0.4000 mg | SUBLINGUAL_TABLET | SUBLINGUAL | Status: AC | PRN
  Administered 2014-10-20 (×3): 0.4 mg via SUBLINGUAL
  Filled 2014-10-20: qty 1

## 2014-10-20 MED ORDER — ASPIRIN 81 MG PO CHEW
324.0000 mg | CHEWABLE_TABLET | Freq: Once | ORAL | Status: AC
  Administered 2014-10-20: 324 mg via ORAL
  Filled 2014-10-20: qty 4

## 2014-10-20 MED ORDER — LEFLUNOMIDE 20 MG PO TABS
20.0000 mg | ORAL_TABLET | Freq: Every day | ORAL | Status: DC
  Administered 2014-10-21: 20 mg via ORAL
  Filled 2014-10-20: qty 1

## 2014-10-20 MED ORDER — PNEUMOCOCCAL VAC POLYVALENT 25 MCG/0.5ML IJ INJ
0.5000 mL | INJECTION | INTRAMUSCULAR | Status: DC
  Filled 2014-10-20: qty 0.5

## 2014-10-20 MED ORDER — ROPINIROLE HCL 1 MG PO TABS
1.0000 mg | ORAL_TABLET | Freq: Every day | ORAL | Status: DC
  Administered 2014-10-20: 1 mg via ORAL
  Filled 2014-10-20: qty 1

## 2014-10-20 MED ORDER — ACETAMINOPHEN 325 MG PO TABS: 650.0000 mg | ORAL_TABLET | ORAL | Status: DC | PRN

## 2014-10-20 MED ORDER — ASPIRIN 81 MG PO CHEW: 324.0000 mg | CHEWABLE_TABLET | ORAL | Status: DC

## 2014-10-20 MED ORDER — ASPIRIN EC 81 MG PO TBEC
81.0000 mg | DELAYED_RELEASE_TABLET | Freq: Every day | ORAL | Status: DC
  Administered 2014-10-21: 81 mg via ORAL
  Filled 2014-10-20: qty 1

## 2014-10-20 MED ORDER — ASPIRIN 300 MG RE SUPP: 300.0000 mg | RECTAL | Status: DC

## 2014-10-20 MED ORDER — DULOXETINE HCL 60 MG PO CPEP
60.0000 mg | ORAL_CAPSULE | Freq: Every day | ORAL | Status: DC
  Administered 2014-10-21: 60 mg via ORAL
  Filled 2014-10-20: qty 1

## 2014-10-20 MED ORDER — ONDANSETRON HCL 4 MG/2ML IJ SOLN: 4.0000 mg | Freq: Four times a day (QID) | INTRAMUSCULAR | Status: DC | PRN

## 2014-10-20 MED ORDER — HEPARIN SODIUM (PORCINE) 5000 UNIT/ML IJ SOLN
5000.0000 [IU] | Freq: Three times a day (TID) | INTRAMUSCULAR | Status: DC
  Administered 2014-10-21: 5000 [IU] via SUBCUTANEOUS
  Filled 2014-10-20: qty 1

## 2014-10-20 NOTE — ED Notes (Signed)
Pt presents to department for evaluation of L sided chest pain x3 days. Also states SOB and fatigue. 5/10 L sided chest pain upon arrival to ED. Respirations unlabored. Pt is alert and oriented x4.

## 2014-10-20 NOTE — Consult Note (Signed)
Patient ID: Marilyn Perez MRN: [id number], DOB/AGE: [birth year]   Admit date: 10/20/2014   Primary Physician: Kandice Hams, MD Primary Cardiologist: New  Pt. Profile:  64 y/o female, long time smoker, but no other cardiac risk factors, presenting with chest pain c/w unstable angina.   Problem List  Past Medical History  Diagnosis Date  . Arthritis     ra  . Anxiety     Past Surgical History  Procedure Laterality Date  . Abdominal hysterectomy    . Back surgery    . Carpal tunnel release    . Eye surgery    . Anterior cervical decomp/discectomy fusion N/A 03/10/2014    Procedure: Cervical five-six Anterior Cervical Discectomy with  Fusion and Plating ;  Surgeon: Faythe Ghee, MD;  Location: [facility];  Service: Neurosurgery;  Laterality: N/A;     Allergies  Allergies  Allergen Reactions  . Penicillins Swelling and Rash    "facial swelling"  . Remicade [Infliximab] Swelling and Rash  . Sulfa Antibiotics Rash    HPI The patient is a 64 y/o female, with no prior cardiac history presenting to the ED with a 3 day history of intermittent left sided chest pain. She admits to a long history of tobacco use but denies any other cardiac risk factors. No personal history of HTN, HLD or DM. She has rheumatoid arthritis with mostly joint involvement. Her family history is notable for CHF in her mother (diagnosed late in age. No known h/o MI/CAD). The patient also has a h/o neck/ back pain s/p anterior cervical decomp/discectomy fusion by neurosurgery 03/2014.   She notes the development of resting left sided chest pain 3 days ago. Described as chest pressure. Radiates to her left upper shoulder and left arm. 8/10 in maximum intensity. Episodes last 5-10 minutes at a time. No exacerbating factors. She has not noticed any exacerbation with exertion, however she does note that she had to stop her water exercises 30 minutes earlier today due to feeling extremely  tired/fatigue, which is unusual for her.  In addition to her chest pain, she hasn't felt like her typical self over the last 3 days. She notes generalized malaise. She also has experienced nausea but no vomiting. Also lightlessness, but denies palpations, syncope/ near syncope and diaphoresis. No relationship with meals and no relief with antacids.   On arrival to the ED, she was with recurrent pain relieved with SL NTG. However, she has had recurrence x 2 since arrival. EKG shows sinus tach with HR of 111 bpm. Now NSR on telemetry with a HR in the mid 70s. No ischemic abnormalities on her EKG. Initial troponin is normal. CXR unremarkable. BMP notable for mild hypokalemia with K of 3.3. CBC WNL. BP is normal at 132/72/ She is currently CP free.     Home Medications  Prior to Admission medications   Medication Sig Start Date End Date Taking? Authorizing Provider  Abatacept (ORENCIA IV) Inject into the vein every 30 (thirty) days.   Yes Historical Provider, MD  Cyanocobalamin (VITAMIN B-12 IJ) Inject as directed every 30 (thirty) days.   Yes Historical Provider, MD  DULoxetine (CYMBALTA) 60 MG capsule Take 60 mg by mouth daily.   Yes Historical Provider, MD  leflunomide (ARAVA) 20 MG tablet Take 20 mg by mouth daily.   Yes Historical Provider, MD  Misc Natural Products (MIDNITE PO) Take 2 tablets by mouth at bedtime.   Yes Historical Provider, MD  predniSONE (DELTASONE)  5 MG tablet Take 5 mg by mouth daily with breakfast.   Yes Historical Provider, MD  rOPINIRole (REQUIP) 0.5 MG tablet Take 1 mg by mouth at bedtime.   Yes Historical Provider, MD    Family History  Family History  Problem Relation Age of Onset  . Congestive Heart Failure Mother     Social History  History   Social History  . Marital Status: Married    Spouse Name: N/A  . Number of Children: N/A  . Years of Education: N/A   Occupational History  . Not on file.   Social History Main Topics  . Smoking status:  Current Every Day Smoker -- 0.50 packs/day for 6 years    Types: Cigarettes  . Smokeless tobacco: Never Used  . Alcohol Use: No  . Drug Use: No  . Sexual Activity: Not on file   Other Topics Concern  . Not on file   Social History Narrative     Review of Systems General:  No chills, fever, night sweats or weight changes.  Cardiovascular: + chest pain, no dyspnea on exertion, edema, orthopnea, palpitations, paroxysmal nocturnal dyspnea. Dermatological: No rash, lesions/masses Respiratory: No cough, dyspnea Urologic: No hematuria, dysuria Abdominal:   No nausea, vomiting, diarrhea, bright red blood per rectum, melena, or hematemesis Neurologic:  No visual changes, wkns, changes in mental status. All other systems reviewed and are otherwise negative except as noted above.  Physical Exam  Blood pressure 132/72, pulse 84, temperature 98.3 F (36.8 C), temperature source Oral, resp. rate 15, height 5\' 5"  (1.651 m), weight 168 lb (76.204 kg), SpO2 96 %.  General: Pleasant, NAD Psych: Normal affect. Neuro: Alert and oriented X 3. Moves all extremities spontaneously. HEENT: Normal  Neck: Supple without bruits or JVD. Lungs:  Resp regular and unlabored, decreased BS at the bases Heart: RRR no s3, s4, or murmurs. Abdomen: Soft, non-tender, non-distended, BS + x 4.  Extremities: No clubbing, cyanosis or edema. DP/PT/Radials 2+ and equal bilaterally.  Labs  Troponin (Point of Care Test)  Recent Labs  10/20/14 1057  TROPIPOC 0.00   No results for input(s): CKTOTAL, CKMB, TROPONINI in the last 72 hours. Lab Results  Component Value Date   WBC 4.6 10/20/2014   HGB 12.8 10/20/2014   HCT 38.7 10/20/2014   MCV 93.3 10/20/2014   PLT 171 10/20/2014     Recent Labs Lab 10/20/14 1049  NA 141  K 3.3*  CL 108  CO2 25  BUN 8  CREATININE 0.86  CALCIUM 8.9  PROT 6.5  BILITOT 0.8  ALKPHOS 72  ALT 15  AST 21  GLUCOSE 147*   No results found for: CHOL, HDL, LDLCALC, TRIG No  results found for: DDIMER   Radiology/Studies  Dg Chest 2 View  10/20/2014   CLINICAL DATA:  Left-sided chest pain for 3 days, shortness of breath  EXAM: CHEST  2 VIEW  COMPARISON:  None.  FINDINGS: There is no focal parenchymal opacity. There is no pleural effusion or pneumothorax. The heart and mediastinal contours are unremarkable.  There is anterior cervical fusion at C5-6.  IMPRESSION: No active cardiopulmonary disease.   Electronically Signed   By: Kathreen Devoid   On: 10/20/2014 10:32    ECG  Sinus tach 111 bpm. No ischemia   ASSESSMENT AND PLAN  Active Problems:   Unstable angina  1. Unstable Angina: symptoms concerning for [place]: new development of resting left sided chest/ shoulder/arm pain, in this chronic smoker. Currently pain free  at time of assessment, but patient notes frequent recurrence since arrival. EKG show sinus tach but no ischemia. Initial troponin is negative. Will admit to telemetry and will continue to cycle cardiac enzymes. With recurrent intermittent pain, would favor initiation of low dose IV nitro and IV heparin until enzymes are completely cycled. Liver function is normal, will add statin therapy with Lipitor. Will check FLP in the am. Add daily ASA and low dose BB if BP can tolerate. Will likely need stress vs. LHC to define coronary anatomy. Will check a 2D echo.   2. Tobacco Use: Major cardiac risk factor. Smoking cessation strongly advised.   3. Hypokalemia: K 3.3. Will supplement with K-Dur. F/u BMP in the am.    Signed, Lyda Jester, PA-C 10/20/2014, 2:11 PM   I have examined the patient and reviewed assessment and plan and discussed with patient.  Agree with above as stated.  Several atypical features to her sx. She does have RF for CAD, particularly smoking.  Will observe in house for rule out MI.  COnsider stress test in AM.  If she rules in, then would plan for cath Monday.  If she has refractory sx, would plan on cath regardless.  If stress is  negative or low risk, then likely discharge tomorrow.  We discussed outpatient stress test but her sx are frequent enough that it is unlikely she will remain sx free until next week.   Marilyn Sobieski S.

## 2014-10-20 NOTE — ED Provider Notes (Signed)
CSN: [id number]     Arrival date & time 10/20/14  1009 History   First MD Initiated Contact with Patient 10/20/14 1024     Chief Complaint  Patient presents with  . Chest Pain     (Consider location/radiation/quality/duration/timing/severity/associated sxs/prior Treatment) Patient is a 64 y.o. female presenting with chest pain. The history is provided by the patient and medical records.  Chest Pain  This is a 64 year old female with past medical history significant for arthritis and anxiety, presenting to the ED for chest pain. Patient states for the past 3 days she's been feeling generally unwell. She reports intermittent left-sided chest pain which she describes as a pressure. States pain can occur independent of exertion, sometimes occurring when she is sitting on the couch. She reports shortness of breath, lightheadedness, and nausea when pain occurs. She denies any radiation of pain. Patient has no known cardiac history. Her mother died of congestive heart failure. Patient is a daily smoker.  Past Medical History  Diagnosis Date  . Arthritis     ra  . Anxiety    Past Surgical History  Procedure Laterality Date  . Abdominal hysterectomy    . Back surgery    . Carpal tunnel release    . Eye surgery    . Anterior cervical decomp/discectomy fusion N/A 03/10/2014    Procedure: Cervical five-six Anterior Cervical Discectomy with  Fusion and Plating ;  Surgeon: Faythe Ghee, MD;  Location: [facility];  Service: Neurosurgery;  Laterality: N/A;   No family history on file. History  Substance Use Topics  . Smoking status: Current Every Day Smoker -- 0.50 packs/day for 6 years    Types: Cigarettes  . Smokeless tobacco: Never Used  . Alcohol Use: No   OB History    No data available     Review of Systems  Cardiovascular: Positive for chest pain.  All other systems reviewed and are negative.     Allergies  Penicillins and Sulfa antibiotics  Home Medications   Prior  to Admission medications   Medication Sig Start Date End Date Taking? Authorizing Provider  Abatacept (ORENCIA IV) Inject into the vein every 30 (thirty) days.    Historical Provider, MD  Cyanocobalamin (VITAMIN B-12 IJ) Inject as directed every 30 (thirty) days.    Historical Provider, MD  DULoxetine (CYMBALTA) 60 MG capsule Take 60 mg by mouth daily.    Historical Provider, MD  leflunomide (ARAVA) 20 MG tablet Take 20 mg by mouth daily.    Historical Provider, MD  predniSONE (DELTASONE) 5 MG tablet Take 5 mg by mouth daily with breakfast.    Historical Provider, MD  rOPINIRole (REQUIP) 0.5 MG tablet Take 1 mg by mouth at bedtime.    Historical Provider, MD  VENTOLIN HFA 108 (90 BASE) MCG/ACT inhaler  08/02/14   Historical Provider, MD   BP 136/84 mmHg  Pulse 110  Temp(Src) 98.3 F (36.8 C) (Oral)  Resp 18  Ht 5\' 5"  (1.651 m)  Wt 168 lb (76.204 kg)  BMI 27.96 kg/m2  SpO2 95%   Physical Exam  Constitutional: She is oriented to person, place, and time. She appears well-developed and well-nourished. No distress.  HENT:  Head: Normocephalic and atraumatic.  Mouth/Throat: Oropharynx is clear and moist.  Eyes: Conjunctivae and EOM are normal. Pupils are equal, round, and reactive to light.  Neck: Normal range of motion. Neck supple.  Cardiovascular: Normal rate, regular rhythm and normal heart sounds.   Pulmonary/Chest: Effort normal and  breath sounds normal. No respiratory distress. She has no wheezes.  Abdominal: Soft. Bowel sounds are normal. There is no tenderness. There is no guarding.  Musculoskeletal: Normal range of motion. She exhibits no edema.  Neurological: She is alert and oriented to person, place, and time.  Skin: Skin is warm and dry. She is not diaphoretic.  Psychiatric: She has a normal mood and affect.  Nursing note and vitals reviewed.   ED Course  Procedures (including critical care time)  Labs Review Labs Reviewed  COMPREHENSIVE METABOLIC PANEL - Abnormal;  Notable for the following:    Potassium 3.3 (*)    Glucose, Bld 147 (*)    All other components within normal limits  CBC WITH DIFFERENTIAL/PLATELET  Randolm Idol, ED    Imaging Review Dg Chest 2 View  10/20/2014   CLINICAL DATA:  Left-sided chest pain for 3 days, shortness of breath  EXAM: CHEST  2 VIEW  COMPARISON:  None.  FINDINGS: There is no focal parenchymal opacity. There is no pleural effusion or pneumothorax. The heart and mediastinal contours are unremarkable.  There is anterior cervical fusion at C5-6.  IMPRESSION: No active cardiopulmonary disease.   Electronically Signed   By: Kathreen Devoid   On: 10/20/2014 10:32     EKG Interpretation   Date/Time:  Friday October 20 2014 10:18:39 EDT Ventricular Rate:  111 PR Interval:  144 QRS Duration: 72 QT Interval:  338 QTC Calculation: 459 R Axis:   84 Text Interpretation:  Sinus tachycardia Otherwise normal ECG Confirmed by  Hazle Coca [phone]) on 10/20/2014 10:57:50 AM      MDM   Final diagnoses:  Chest pain, unspecified chest pain type  Unstable angina   64 year old female here with intermittent chest pain over the past 3 days. Pain is not directly related to exertion. Clinical suspicion for unstable angina. EKG sinus tachycardia, heart rate now within normal limits. Labwork is reassuring, troponin negative. Chest x-ray is clear. Patient was treated with aspirin and nitroglycerin with resolution of her pain. Case was discussed with cardiology, will admit for workup of unstable angina including LHC.  Larene Pickett, PA-C 10/20/14 1536  Quintella Reichert, MD 10/20/14 1640

## 2014-10-21 ENCOUNTER — Observation Stay (HOSPITAL_COMMUNITY): Payer: Medicare Other

## 2014-10-21 ENCOUNTER — Encounter (HOSPITAL_COMMUNITY): Payer: Self-pay | Admitting: Physician Assistant

## 2014-10-21 DIAGNOSIS — R079 Chest pain, unspecified: Secondary | ICD-10-CM | POA: Diagnosis not present

## 2014-10-21 DIAGNOSIS — F419 Anxiety disorder, unspecified: Secondary | ICD-10-CM | POA: Diagnosis not present

## 2014-10-21 DIAGNOSIS — I200 Unstable angina: Secondary | ICD-10-CM | POA: Diagnosis not present

## 2014-10-21 DIAGNOSIS — E785 Hyperlipidemia, unspecified: Secondary | ICD-10-CM

## 2014-10-21 DIAGNOSIS — M1990 Unspecified osteoarthritis, unspecified site: Secondary | ICD-10-CM | POA: Diagnosis not present

## 2014-10-21 HISTORY — DX: Hyperlipidemia, unspecified: E78.5

## 2014-10-21 LAB — BASIC METABOLIC PANEL
Anion gap: 7 (ref 5–15)
BUN: 11 mg/dL (ref 6–20)
CO2: 25 mmol/L (ref 22–32)
CREATININE: 0.65 mg/dL (ref 0.44–1.00)
Calcium: 8.4 mg/dL — ABNORMAL LOW (ref 8.9–10.3)
Chloride: 110 mmol/L (ref 101–111)
GFR calc Af Amer: >60 mL/min (ref >60)
GLUCOSE: 103 mg/dL — AB (ref 65–99)
Potassium: 3.9 mmol/L (ref 3.5–5.1)
SODIUM: 142 mmol/L (ref 135–145)

## 2014-10-21 LAB — LIPID PANEL
CHOL/HDL RATIO: 3.9 ratio
Cholesterol: 202 mg/dL — ABNORMAL HIGH (ref 0–200)
HDL: 52 mg/dL (ref >40)
LDL Cholesterol: 132 mg/dL — ABNORMAL HIGH (ref 0–99)
Triglycerides: 89 mg/dL (ref <150)
VLDL: 18 mg/dL (ref 0–40)

## 2014-10-21 LAB — HEMOGLOBIN A1C
HEMOGLOBIN A1C: 5.8 % — AB (ref 4.8–5.6)
MEAN PLASMA GLUCOSE: 120 mg/dL

## 2014-10-21 LAB — TROPONIN I: Troponin I: <0.03 ng/mL (ref <0.031)

## 2014-10-21 MED ORDER — ATORVASTATIN CALCIUM 20 MG PO TABS: 20.0000 mg | ORAL_TABLET | Freq: Every day | ORAL | Status: DC

## 2014-10-21 MED ORDER — TECHNETIUM TC 99M SESTAMIBI - CARDIOLITE
10.0000 | Freq: Once | INTRAVENOUS | Status: AC | PRN
  Administered 2014-10-21: 08:00:00 10 via INTRAVENOUS

## 2014-10-21 MED ORDER — TECHNETIUM TC 99M SESTAMIBI - CARDIOLITE
30.0000 | Freq: Once | INTRAVENOUS | Status: AC | PRN
  Administered 2014-10-21: 30 via INTRAVENOUS

## 2014-10-21 MED ORDER — ASPIRIN 81 MG PO TBEC: 81.0000 mg | DELAYED_RELEASE_TABLET | Freq: Every day | ORAL | Status: DC

## 2014-10-21 MED ORDER — POTASSIUM CHLORIDE CRYS ER 20 MEQ PO TBCR
40.0000 meq | EXTENDED_RELEASE_TABLET | Freq: Once | ORAL | Status: AC
  Administered 2014-10-21: 40 meq via ORAL
  Filled 2014-10-21: qty 2

## 2014-10-21 NOTE — Progress Notes (Signed)
On call cardiologist paged; pt K is 3.3; requested order for K-dur per previous progress note from cardiology.

## 2014-10-21 NOTE — Progress Notes (Signed)
Patient Name: Marilyn Perez Date of Encounter: 10/21/2014  Primary Cardiologist: new - Dr. Irish Lack   Active Problems:   Unstable angina   Chest pain with moderate risk for cardiac etiology    SUBJECTIVE  Patient seen in [place], continue to have intermittent CP, none currently. No SOB.   CURRENT MEDS . aspirin EC  81 mg Oral Daily  . aspirin  300 mg Rectal NOW  . DULoxetine  60 mg Oral Daily  . heparin  5,000 Units Subcutaneous 3 times per day  . leflunomide  20 mg Oral Daily  . pneumococcal 23 valent vaccine  0.5 mL Intramuscular Tomorrow-1000  . predniSONE  5 mg Oral Q breakfast  . rOPINIRole  1 mg Oral QHS    OBJECTIVE  Filed Vitals:   10/20/14 1815 10/20/14 2000 10/21/14 0521 10/21/14 0909  BP: 139/64 127/67 106/54 155/75  Pulse:  75 83   Temp:  98.7 F (37.1 C) 98.6 F (37 C)   TempSrc:  Oral Oral   Resp: 22 15 15    Height:   5' 5.5" (1.664 m)   Weight:   165 lb (74.844 kg)   SpO2:  98% 98%     Intake/Output Summary (Last 24 hours) at 10/21/14 0916 Last data filed at 10/20/14 2100  Gross per 24 hour  Intake    480 ml  Output      0 ml  Net    480 ml   Filed Weights   10/20/14 1016 10/20/14 1717 10/21/14 0521  Weight: 168 lb (76.204 kg) 168 lb (76.204 kg) 165 lb (74.844 kg)    PHYSICAL EXAM  General: Pleasant, NAD. Neuro: Alert and oriented X 3. Moves all extremities spontaneously. Psych: Normal affect. HEENT:  Normal  Neck: Supple without bruits or JVD. Lungs:  Resp regular and unlabored, CTA. Heart: RRR no s3, s4, or murmurs. Abdomen: Soft, non-tender, non-distended, BS + x 4.  Extremities: No clubbing, cyanosis or edema. DP/PT/Radials 2+ and equal bilaterally.  Accessory Clinical Findings  CBC  Recent Labs  10/20/14 1049  WBC 4.6  NEUTROABS 2.5  HGB 12.8  HCT 38.7  MCV 93.3  PLT 099   Basic Metabolic Panel  Recent Labs  10/20/14 1049 10/21/14 0542  NA 141 142  K 3.3* 3.9  CL 108 110  CO2 25 25  GLUCOSE 147* 103*   BUN 8 11  CREATININE 0.86 0.65  CALCIUM 8.9 8.4*   Liver Function Tests  Recent Labs  10/20/14 1049  AST 21  ALT 15  ALKPHOS 72  BILITOT 0.8  PROT 6.5  ALBUMIN 3.6   Cardiac Enzymes  Recent Labs  10/20/14 1840 10/20/14 2316 10/21/14 0542  TROPONINI <0.03 <0.03 <0.03   Hemoglobin A1C  Recent Labs  10/20/14 1840  HGBA1C 5.8*   Fasting Lipid Panel  Recent Labs  10/21/14 0542  CHOL 202*  HDL 52  LDLCALC 132*  TRIG 89  CHOLHDL 3.9   Thyroid Function Tests No results for input(s): TSH, T4TOTAL, T3FREE, THYROIDAB in the last 72 hours.  Invalid input(s): FREET3  ECG  NSR without significant ST-T wave changes   Radiology/Studies  Dg Chest 2 View  10/20/2014   CLINICAL DATA:  Left-sided chest pain for 3 days, shortness of breath  EXAM: CHEST  2 VIEW  COMPARISON:  None.  FINDINGS: There is no focal parenchymal opacity. There is no pleural effusion or pneumothorax. The heart and mediastinal contours are unremarkable.  There is anterior cervical fusion at  C5-6.  IMPRESSION: No active cardiopulmonary disease.   Electronically Signed   By: Kathreen Devoid   On: 10/20/2014 10:32    ASSESSMENT AND PLAN  64 yo female who is a long time smoker but no prior cardiac history present with CP that is unrelated to exertion for 3 days. Diagnosed with hyperlipidemia during this admission.   1. Intermittent chest pain  - serial trop negative overnight, no ischemic EKG changes  - pending treadmill myoview today, if negative can be discharged this afternoon to followup with PCP  2. Hyperlipidemia, new diagnosis  - will start on low dose lipitor  3. Tobacco abuse  Signed, Almyra Deforest PA-C Pager: [phone] Myoview normal  Encouraged to stop smoking  Follow-up with PCP. No need for cardiac follow-up.

## 2014-10-21 NOTE — Progress Notes (Signed)
Pt completed part 1 of 1 day treadmill myoview without complication. Pending final result by [facility]  Signed, Almyra Deforest PA Pager: [phone]

## 2014-10-21 NOTE — Discharge Summary (Signed)
Discharge Summary   Patient ID: Marilyn Perez,  MRN: [id number], DOB/AGE: December 25, 1950 64 y.o.  Admit date: 10/20/2014 Discharge date: 10/21/2014  Primary Care Provider: Seward Carol D Primary Cardiologist: New - Dr. Irish Lack   Discharge Diagnoses Principal Problem:   Chest pain with moderate risk for cardiac etiology Active Problems:   TOBACCO DEPENDENCE   Hyperlipidemia   Allergies Allergies  Allergen Reactions  . Penicillins Swelling and Rash    "facial swelling"  . Remicade [Infliximab] Swelling and Rash  . Sulfa Antibiotics Rash    Procedures  Treadmill myoview 10/21/2014  IMPRESSION: 1. No reversible ischemia or infarction.  2. Normal left ventricular wall motion.  3. Left ventricular ejection fraction 60%  4. Low-risk stress test findings*.     Hospital Course  The patient is a 64 year old female was no prior cardiac history who present to [facility] on 10/20/2014 with 3 days of intermittent left-sided chest. She does have a history of tobacco abuse. She was admitted to the etiology service for chest pain rule out. Overnight serial troponin was negative. Lipid panel on the following day shows cholesterol 202, LDL 132. Lipitor was added to her medical regimen. She underwent a treadmill Myoview on 10/21/2014, she had a transient very atypical episode of chest pain which lasted only 2 seconds near the beginning of the Myoview, however quickly resolved despite increasing exertion. She finished 6 min on the treadmill without further chest pain. Myoview showed no reversible ischemia or infarction, EF 60%, overall deemed low risk test finding. She is deemed stable for discharge from cardiology perspective to follow-up with her primary care physician. I will leave her our contact information to call only on an as needed base, otherwise she does not need cardiology followup.   Discharge Vitals Blood pressure 126/63, pulse 92, temperature 98 F (36.7 C),  temperature source Oral, resp. rate 20, height 5' 5.5" (1.664 m), weight 165 lb (74.844 kg), SpO2 97 %.  Filed Weights   10/20/14 1016 10/20/14 1717 10/21/14 0521  Weight: 168 lb (76.204 kg) 168 lb (76.204 kg) 165 lb (74.844 kg)    Labs  CBC  Recent Labs  10/20/14 1049  WBC 4.6  NEUTROABS 2.5  HGB 12.8  HCT 38.7  MCV 93.3  PLT 784   Basic Metabolic Panel  Recent Labs  10/20/14 1049 10/21/14 0542  NA 141 142  K 3.3* 3.9  CL 108 110  CO2 25 25  GLUCOSE 147* 103*  BUN 8 11  CREATININE 0.86 0.65  CALCIUM 8.9 8.4*   Liver Function Tests  Recent Labs  10/20/14 1049  AST 21  ALT 15  ALKPHOS 72  BILITOT 0.8  PROT 6.5  ALBUMIN 3.6   No results for input(s): LIPASE, AMYLASE in the last 72 hours. Cardiac Enzymes  Recent Labs  10/20/14 1840 10/20/14 2316 10/21/14 0542  TROPONINI <0.03 <0.03 <0.03   BNP Invalid input(s): POCBNP D-Dimer No results for input(s): DDIMER in the last 72 hours. Hemoglobin A1C  Recent Labs  10/20/14 1840  HGBA1C 5.8*   Fasting Lipid Panel  Recent Labs  10/21/14 0542  CHOL 202*  HDL 52  LDLCALC 132*  TRIG 89  CHOLHDL 3.9   Thyroid Function Tests No results for input(s): TSH, T4TOTAL, T3FREE, THYROIDAB in the last 72 hours.  Invalid input(s): FREET3  Disposition  Pt is being discharged home today in good condition.  Follow-up Plans & Appointments      Follow-up Information    Follow up with POLITE,RONALD  D, MD.   Specialty:  Internal Medicine   Why:  Followup with your primary care physician   Contact information:   [address] [place] [address] [phone]       Follow up with Jettie Booze., MD.   Specialties:  Cardiology, Radiology, Interventional Cardiology   Why:  Contact cardiology only on an as-needed base, otherwise please followup with your primary care physician   Contact information:   [address] [address] [phone]        Discharge Medications    Medication List    TAKE these medications        aspirin 81 MG EC tablet  Take 1 tablet (81 mg total) by mouth daily.     atorvastatin 20 MG tablet  Commonly known as:  LIPITOR  Take 1 tablet (20 mg total) by mouth daily at 6 PM.     DULoxetine 60 MG capsule  Commonly known as:  CYMBALTA  Take 60 mg by mouth daily.     leflunomide 20 MG tablet  Commonly known as:  ARAVA  Take 20 mg by mouth daily.     MIDNITE PO  Take 2 tablets by mouth at bedtime.     ORENCIA IV  Inject into the vein every 30 (thirty) days.     predniSONE 5 MG tablet  Commonly known as:  DELTASONE  Take 5 mg by mouth daily with breakfast.     rOPINIRole 0.5 MG tablet  Commonly known as:  REQUIP  Take 1 mg by mouth at bedtime.     VITAMIN B-12 IJ  Inject as directed every 30 (thirty) days.          Duration of Discharge Encounter   Greater than 30 minutes including physician time.  Hilbert Corrigan PA-C Pager: [phone] 10/21/2014, 1:45 PM

## 2014-10-21 NOTE — Discharge Instructions (Signed)

## 2014-10-23 ENCOUNTER — Telehealth: Payer: Self-pay | Admitting: Cardiovascular Disease

## 2014-10-23 NOTE — Telephone Encounter (Signed)
Needs a D/C phone call , Per D/C patient to see Dr. Christ Kick as needed .Marland Kitchen

## 2014-10-27 NOTE — Telephone Encounter (Signed)
Patient contacted regarding discharge from [facility] on 10/21/14.  Patient understands to follow up with Dr Irish Lack as needed. Patient understands discharge instructions? yes Patient understands medications and regiment? yes

## 2014-10-31 DIAGNOSIS — E538 Deficiency of other specified B group vitamins: Secondary | ICD-10-CM | POA: Diagnosis not present

## 2014-11-14 DIAGNOSIS — M0609 Rheumatoid arthritis without rheumatoid factor, multiple sites: Secondary | ICD-10-CM | POA: Diagnosis not present

## 2014-11-14 DIAGNOSIS — Z79899 Other long term (current) drug therapy: Secondary | ICD-10-CM | POA: Diagnosis not present

## 2014-11-14 DIAGNOSIS — M150 Primary generalized (osteo)arthritis: Secondary | ICD-10-CM | POA: Diagnosis not present

## 2014-11-16 DIAGNOSIS — M0609 Rheumatoid arthritis without rheumatoid factor, multiple sites: Secondary | ICD-10-CM | POA: Diagnosis not present

## 2014-12-06 DIAGNOSIS — E538 Deficiency of other specified B group vitamins: Secondary | ICD-10-CM | POA: Diagnosis not present

## 2014-12-14 DIAGNOSIS — M0609 Rheumatoid arthritis without rheumatoid factor, multiple sites: Secondary | ICD-10-CM | POA: Diagnosis not present

## 2015-01-09 DIAGNOSIS — E538 Deficiency of other specified B group vitamins: Secondary | ICD-10-CM | POA: Diagnosis not present

## 2015-01-11 DIAGNOSIS — M159 Polyosteoarthritis, unspecified: Secondary | ICD-10-CM | POA: Diagnosis not present

## 2015-01-11 DIAGNOSIS — M2550 Pain in unspecified joint: Secondary | ICD-10-CM | POA: Diagnosis not present

## 2015-01-11 DIAGNOSIS — M0609 Rheumatoid arthritis without rheumatoid factor, multiple sites: Secondary | ICD-10-CM | POA: Diagnosis not present

## 2015-01-11 DIAGNOSIS — Z79899 Other long term (current) drug therapy: Secondary | ICD-10-CM | POA: Diagnosis not present

## 2015-01-11 DIAGNOSIS — M353 Polymyalgia rheumatica: Secondary | ICD-10-CM | POA: Diagnosis not present

## 2015-01-12 DIAGNOSIS — M0609 Rheumatoid arthritis without rheumatoid factor, multiple sites: Secondary | ICD-10-CM | POA: Diagnosis not present

## 2015-01-29 DIAGNOSIS — M2550 Pain in unspecified joint: Secondary | ICD-10-CM | POA: Diagnosis not present

## 2015-01-29 DIAGNOSIS — M353 Polymyalgia rheumatica: Secondary | ICD-10-CM | POA: Diagnosis not present

## 2015-01-29 DIAGNOSIS — M159 Polyosteoarthritis, unspecified: Secondary | ICD-10-CM | POA: Diagnosis not present

## 2015-01-29 DIAGNOSIS — Z79899 Other long term (current) drug therapy: Secondary | ICD-10-CM | POA: Diagnosis not present

## 2015-01-29 DIAGNOSIS — M0609 Rheumatoid arthritis without rheumatoid factor, multiple sites: Secondary | ICD-10-CM | POA: Diagnosis not present

## 2015-02-12 DIAGNOSIS — E538 Deficiency of other specified B group vitamins: Secondary | ICD-10-CM | POA: Diagnosis not present

## 2015-02-12 DIAGNOSIS — Z79899 Other long term (current) drug therapy: Secondary | ICD-10-CM | POA: Diagnosis not present

## 2015-02-12 DIAGNOSIS — M0609 Rheumatoid arthritis without rheumatoid factor, multiple sites: Secondary | ICD-10-CM | POA: Diagnosis not present

## 2015-02-15 DIAGNOSIS — M0609 Rheumatoid arthritis without rheumatoid factor, multiple sites: Secondary | ICD-10-CM | POA: Diagnosis not present

## 2015-03-16 DIAGNOSIS — Z23 Encounter for immunization: Secondary | ICD-10-CM | POA: Diagnosis not present

## 2015-03-16 DIAGNOSIS — E538 Deficiency of other specified B group vitamins: Secondary | ICD-10-CM | POA: Diagnosis not present

## 2015-04-12 DIAGNOSIS — M159 Polyosteoarthritis, unspecified: Secondary | ICD-10-CM | POA: Diagnosis not present

## 2015-04-12 DIAGNOSIS — M353 Polymyalgia rheumatica: Secondary | ICD-10-CM | POA: Diagnosis not present

## 2015-04-12 DIAGNOSIS — M2550 Pain in unspecified joint: Secondary | ICD-10-CM | POA: Diagnosis not present

## 2015-04-12 DIAGNOSIS — M0609 Rheumatoid arthritis without rheumatoid factor, multiple sites: Secondary | ICD-10-CM | POA: Diagnosis not present

## 2015-04-12 DIAGNOSIS — Z79899 Other long term (current) drug therapy: Secondary | ICD-10-CM | POA: Diagnosis not present

## 2015-04-25 DIAGNOSIS — M0609 Rheumatoid arthritis without rheumatoid factor, multiple sites: Secondary | ICD-10-CM | POA: Diagnosis not present

## 2015-05-09 DIAGNOSIS — M0609 Rheumatoid arthritis without rheumatoid factor, multiple sites: Secondary | ICD-10-CM | POA: Diagnosis not present

## 2015-05-23 DIAGNOSIS — M0609 Rheumatoid arthritis without rheumatoid factor, multiple sites: Secondary | ICD-10-CM | POA: Diagnosis not present

## 2015-05-25 DIAGNOSIS — Z1389 Encounter for screening for other disorder: Secondary | ICD-10-CM | POA: Diagnosis not present

## 2015-05-25 DIAGNOSIS — M8580 Other specified disorders of bone density and structure, unspecified site: Secondary | ICD-10-CM | POA: Diagnosis not present

## 2015-05-25 DIAGNOSIS — M069 Rheumatoid arthritis, unspecified: Secondary | ICD-10-CM | POA: Diagnosis not present

## 2015-05-25 DIAGNOSIS — Z0000 Encounter for general adult medical examination without abnormal findings: Secondary | ICD-10-CM | POA: Diagnosis not present

## 2015-05-25 DIAGNOSIS — E785 Hyperlipidemia, unspecified: Secondary | ICD-10-CM | POA: Diagnosis not present

## 2015-05-25 DIAGNOSIS — E538 Deficiency of other specified B group vitamins: Secondary | ICD-10-CM | POA: Diagnosis not present

## 2015-06-20 DIAGNOSIS — M0609 Rheumatoid arthritis without rheumatoid factor, multiple sites: Secondary | ICD-10-CM | POA: Diagnosis not present

## 2015-06-26 DIAGNOSIS — E538 Deficiency of other specified B group vitamins: Secondary | ICD-10-CM | POA: Diagnosis not present

## 2015-07-11 DIAGNOSIS — M353 Polymyalgia rheumatica: Secondary | ICD-10-CM | POA: Diagnosis not present

## 2015-07-11 DIAGNOSIS — Z79899 Other long term (current) drug therapy: Secondary | ICD-10-CM | POA: Diagnosis not present

## 2015-07-11 DIAGNOSIS — M0609 Rheumatoid arthritis without rheumatoid factor, multiple sites: Secondary | ICD-10-CM | POA: Diagnosis not present

## 2015-07-11 DIAGNOSIS — M159 Polyosteoarthritis, unspecified: Secondary | ICD-10-CM | POA: Diagnosis not present

## 2015-07-11 DIAGNOSIS — M2550 Pain in unspecified joint: Secondary | ICD-10-CM | POA: Diagnosis not present

## 2015-07-12 DIAGNOSIS — M19072 Primary osteoarthritis, left ankle and foot: Secondary | ICD-10-CM | POA: Diagnosis not present

## 2015-07-24 DIAGNOSIS — M19072 Primary osteoarthritis, left ankle and foot: Secondary | ICD-10-CM | POA: Diagnosis not present

## 2015-07-24 DIAGNOSIS — G8918 Other acute postprocedural pain: Secondary | ICD-10-CM | POA: Diagnosis not present

## 2015-08-03 DIAGNOSIS — M0609 Rheumatoid arthritis without rheumatoid factor, multiple sites: Secondary | ICD-10-CM | POA: Diagnosis not present

## 2015-08-07 DIAGNOSIS — M19072 Primary osteoarthritis, left ankle and foot: Secondary | ICD-10-CM | POA: Diagnosis not present

## 2015-08-31 DIAGNOSIS — M0609 Rheumatoid arthritis without rheumatoid factor, multiple sites: Secondary | ICD-10-CM | POA: Diagnosis not present

## 2015-09-20 DIAGNOSIS — M19072 Primary osteoarthritis, left ankle and foot: Secondary | ICD-10-CM | POA: Diagnosis not present

## 2015-09-28 DIAGNOSIS — M79672 Pain in left foot: Secondary | ICD-10-CM | POA: Diagnosis not present

## 2015-09-28 DIAGNOSIS — M0609 Rheumatoid arthritis without rheumatoid factor, multiple sites: Secondary | ICD-10-CM | POA: Diagnosis not present

## 2015-10-09 DIAGNOSIS — E538 Deficiency of other specified B group vitamins: Secondary | ICD-10-CM | POA: Diagnosis not present

## 2015-10-11 DIAGNOSIS — M353 Polymyalgia rheumatica: Secondary | ICD-10-CM | POA: Diagnosis not present

## 2015-10-11 DIAGNOSIS — M0609 Rheumatoid arthritis without rheumatoid factor, multiple sites: Secondary | ICD-10-CM | POA: Diagnosis not present

## 2015-10-11 DIAGNOSIS — M159 Polyosteoarthritis, unspecified: Secondary | ICD-10-CM | POA: Diagnosis not present

## 2015-10-11 DIAGNOSIS — Z79899 Other long term (current) drug therapy: Secondary | ICD-10-CM | POA: Diagnosis not present

## 2015-10-11 DIAGNOSIS — M2550 Pain in unspecified joint: Secondary | ICD-10-CM | POA: Diagnosis not present

## 2015-10-11 DIAGNOSIS — M542 Cervicalgia: Secondary | ICD-10-CM | POA: Diagnosis not present

## 2015-10-26 DIAGNOSIS — M0609 Rheumatoid arthritis without rheumatoid factor, multiple sites: Secondary | ICD-10-CM | POA: Diagnosis not present

## 2015-11-08 DIAGNOSIS — E538 Deficiency of other specified B group vitamins: Secondary | ICD-10-CM | POA: Diagnosis not present

## 2015-11-26 DIAGNOSIS — M0609 Rheumatoid arthritis without rheumatoid factor, multiple sites: Secondary | ICD-10-CM | POA: Diagnosis not present

## 2015-12-03 DIAGNOSIS — M8588 Other specified disorders of bone density and structure, other site: Secondary | ICD-10-CM | POA: Diagnosis not present

## 2015-12-03 DIAGNOSIS — M810 Age-related osteoporosis without current pathological fracture: Secondary | ICD-10-CM | POA: Diagnosis not present

## 2015-12-10 DIAGNOSIS — E538 Deficiency of other specified B group vitamins: Secondary | ICD-10-CM | POA: Diagnosis not present

## 2015-12-20 ENCOUNTER — Encounter: Payer: Self-pay | Admitting: Podiatry

## 2015-12-20 ENCOUNTER — Ambulatory Visit (INDEPENDENT_AMBULATORY_CARE_PROVIDER_SITE_OTHER): Payer: Medicare Other

## 2015-12-20 ENCOUNTER — Ambulatory Visit (INDEPENDENT_AMBULATORY_CARE_PROVIDER_SITE_OTHER): Payer: Medicare Other | Admitting: Podiatry

## 2015-12-20 VITALS — BP 153/81 | HR 83 | Ht 66.0 in | Wt 170.0 lb

## 2015-12-20 DIAGNOSIS — M2042 Other hammer toe(s) (acquired), left foot: Secondary | ICD-10-CM | POA: Diagnosis not present

## 2015-12-20 DIAGNOSIS — M79672 Pain in left foot: Secondary | ICD-10-CM

## 2015-12-20 DIAGNOSIS — M779 Enthesopathy, unspecified: Secondary | ICD-10-CM | POA: Diagnosis not present

## 2015-12-20 MED ORDER — TRIAMCINOLONE ACETONIDE 10 MG/ML IJ SUSP
10.0000 mg | Freq: Once | INTRAMUSCULAR | Status: AC
  Administered 2015-12-20: 10 mg

## 2015-12-20 NOTE — Progress Notes (Signed)
   Subjective:    Patient ID: Marilyn Perez, female    DOB: December 01, 1950, 65 y.o.   MRN: [id number]  HPI  I have painful swelling in the ball of my foot that has been increasing over the last 4 months.  So much so that my 2nd and 3rd toes spread when I stand.     Review of Systems  Musculoskeletal: Positive for gait problem.       Objective:   Physical Exam        Assessment & Plan:

## 2015-12-21 NOTE — Progress Notes (Signed)
Subjective:     Patient ID: Marilyn Perez, female   DOB: 1950/07/13, 65 y.o.   MRN: [id number]  HPI patient presents stating she's developed a lot of discomfort in the joint of her left foot and she's not remembering any kind of an injury. States it's been present for several months   Review of Systems  All other systems reviewed and are negative.      Objective:   Physical Exam  Constitutional: She is oriented to person, place, and time.  Cardiovascular: Intact distal pulses.   Musculoskeletal: Normal range of motion.  Neurological: She is oriented to person, place, and time.  Skin: Skin is warm.  Nursing note and vitals reviewed.  neurovascular status intact muscle strength adequate range of motion within normal limits with patient found to have inflammatory changes second metatarsal left with fluid buildup and pain when palpated. Patient's noted to have mild deviation of the toe with Ace pain between the second and third digit which has occurred recently and is noted to have good digital perfusion and is well oriented 3     Assessment:     Inflammatory capsulitis second MPJ left with fluid buildup around the joint surface with pain    Plan:     H&P condition reviewed and at this time reviewed x-ray. I then went ahead did a proximal block of the left foot aspirated the second MPJ getting out of small amount of clear fluid and injected with a quarter cc deck Smith some Kenalog and applied thick plantar padding. Reappoint to recheck in 2 weeks  X-ray report indicated that there is good alignment with no indication of stress fracture or advanced arthritis

## 2015-12-27 DIAGNOSIS — M0609 Rheumatoid arthritis without rheumatoid factor, multiple sites: Secondary | ICD-10-CM | POA: Diagnosis not present

## 2016-01-03 ENCOUNTER — Ambulatory Visit (INDEPENDENT_AMBULATORY_CARE_PROVIDER_SITE_OTHER): Payer: Medicare Other | Admitting: Podiatry

## 2016-01-03 DIAGNOSIS — M216X9 Other acquired deformities of unspecified foot: Secondary | ICD-10-CM | POA: Diagnosis not present

## 2016-01-03 DIAGNOSIS — M779 Enthesopathy, unspecified: Secondary | ICD-10-CM | POA: Diagnosis not present

## 2016-01-03 DIAGNOSIS — M2042 Other hammer toe(s) (acquired), left foot: Secondary | ICD-10-CM | POA: Diagnosis not present

## 2016-01-03 NOTE — Progress Notes (Signed)
Subjective:     Patient ID: Marilyn Perez, female   DOB: 1950/06/29, 65 y.o.   MRN: [id number]  HPI patient states I'm still getting a lot of pain in my forefoot left with inflammation fluid buildup and swelling of both the second and third joints   Review of Systems     Objective:   Physical Exam Neurovascular status unchanged with patient having had previous structural first metatarsal surgery with inflammation and pain around the second and third MPJs left with fluid buildup    Assessment:     Inflammatory capsulitis second and third MPJ left    Plan:     Discussed condition and the fact this may ultimately require surgery but I want to hold off and she is going to the beach and approximate 3 weeks. I applied an short air fracture walker with instructions on him mobilization and discussed possible injection of the third MPJ in future and reappoint 2 weeks to reevaluate

## 2016-01-10 DIAGNOSIS — M159 Polyosteoarthritis, unspecified: Secondary | ICD-10-CM | POA: Diagnosis not present

## 2016-01-10 DIAGNOSIS — M0609 Rheumatoid arthritis without rheumatoid factor, multiple sites: Secondary | ICD-10-CM | POA: Diagnosis not present

## 2016-01-10 DIAGNOSIS — M353 Polymyalgia rheumatica: Secondary | ICD-10-CM | POA: Diagnosis not present

## 2016-01-10 DIAGNOSIS — Z79899 Other long term (current) drug therapy: Secondary | ICD-10-CM | POA: Diagnosis not present

## 2016-01-10 DIAGNOSIS — M2550 Pain in unspecified joint: Secondary | ICD-10-CM | POA: Diagnosis not present

## 2016-01-11 DIAGNOSIS — E538 Deficiency of other specified B group vitamins: Secondary | ICD-10-CM | POA: Diagnosis not present

## 2016-01-16 ENCOUNTER — Ambulatory Visit (INDEPENDENT_AMBULATORY_CARE_PROVIDER_SITE_OTHER): Payer: Medicare Other | Admitting: Podiatry

## 2016-01-16 ENCOUNTER — Encounter: Payer: Self-pay | Admitting: Podiatry

## 2016-01-16 DIAGNOSIS — M779 Enthesopathy, unspecified: Secondary | ICD-10-CM

## 2016-01-16 NOTE — Progress Notes (Signed)
Subjective:     Patient ID: Marilyn Perez, female   DOB: 14-May-1950, 65 y.o.   MRN: [id number]  HPI patient presents stating that the pain is reducing but it is still present   Review of Systems     Objective:   Physical Exam Neurovascular status intact muscle strength adequate with significant diminishment of discomfort in the forefoot left with diminished inflammation of the second third metatarsal but still present with prominent metatarsals    Assessment:     Inflammatory condition subsecond third metatarsal left    Plan:     Recommended long-term orthotics and scanned for orthotics to reduce pressure along with heat ice therapy

## 2016-01-28 DIAGNOSIS — Z79899 Other long term (current) drug therapy: Secondary | ICD-10-CM | POA: Diagnosis not present

## 2016-01-28 DIAGNOSIS — M0609 Rheumatoid arthritis without rheumatoid factor, multiple sites: Secondary | ICD-10-CM | POA: Diagnosis not present

## 2016-01-30 DIAGNOSIS — H04123 Dry eye syndrome of bilateral lacrimal glands: Secondary | ICD-10-CM | POA: Diagnosis not present

## 2016-01-30 DIAGNOSIS — M779 Enthesopathy, unspecified: Secondary | ICD-10-CM

## 2016-02-20 ENCOUNTER — Ambulatory Visit: Payer: Medicare Other | Admitting: Podiatry

## 2016-02-25 DIAGNOSIS — M0609 Rheumatoid arthritis without rheumatoid factor, multiple sites: Secondary | ICD-10-CM | POA: Diagnosis not present

## 2016-02-27 ENCOUNTER — Ambulatory Visit: Payer: Medicare Other

## 2016-02-27 DIAGNOSIS — M779 Enthesopathy, unspecified: Secondary | ICD-10-CM

## 2016-02-27 NOTE — Patient Instructions (Signed)

## 2016-03-24 DIAGNOSIS — M0609 Rheumatoid arthritis without rheumatoid factor, multiple sites: Secondary | ICD-10-CM | POA: Diagnosis not present

## 2016-03-25 DIAGNOSIS — E538 Deficiency of other specified B group vitamins: Secondary | ICD-10-CM | POA: Diagnosis not present

## 2016-03-25 DIAGNOSIS — Z23 Encounter for immunization: Secondary | ICD-10-CM | POA: Diagnosis not present

## 2016-04-10 DIAGNOSIS — Z79899 Other long term (current) drug therapy: Secondary | ICD-10-CM | POA: Diagnosis not present

## 2016-04-10 DIAGNOSIS — M159 Polyosteoarthritis, unspecified: Secondary | ICD-10-CM | POA: Diagnosis not present

## 2016-04-10 DIAGNOSIS — Z6827 Body mass index (BMI) 27.0-27.9, adult: Secondary | ICD-10-CM | POA: Diagnosis not present

## 2016-04-10 DIAGNOSIS — E663 Overweight: Secondary | ICD-10-CM | POA: Diagnosis not present

## 2016-04-10 DIAGNOSIS — M0609 Rheumatoid arthritis without rheumatoid factor, multiple sites: Secondary | ICD-10-CM | POA: Diagnosis not present

## 2016-04-10 DIAGNOSIS — M2550 Pain in unspecified joint: Secondary | ICD-10-CM | POA: Diagnosis not present

## 2016-04-10 DIAGNOSIS — M353 Polymyalgia rheumatica: Secondary | ICD-10-CM | POA: Diagnosis not present

## 2016-04-21 DIAGNOSIS — M0609 Rheumatoid arthritis without rheumatoid factor, multiple sites: Secondary | ICD-10-CM | POA: Diagnosis not present

## 2016-04-25 DIAGNOSIS — E538 Deficiency of other specified B group vitamins: Secondary | ICD-10-CM | POA: Diagnosis not present

## 2016-05-19 DIAGNOSIS — M0609 Rheumatoid arthritis without rheumatoid factor, multiple sites: Secondary | ICD-10-CM | POA: Diagnosis not present

## 2016-05-27 DIAGNOSIS — E538 Deficiency of other specified B group vitamins: Secondary | ICD-10-CM | POA: Diagnosis not present

## 2016-06-09 DIAGNOSIS — M8580 Other specified disorders of bone density and structure, unspecified site: Secondary | ICD-10-CM | POA: Diagnosis not present

## 2016-06-09 DIAGNOSIS — E538 Deficiency of other specified B group vitamins: Secondary | ICD-10-CM | POA: Diagnosis not present

## 2016-06-09 DIAGNOSIS — M069 Rheumatoid arthritis, unspecified: Secondary | ICD-10-CM | POA: Diagnosis not present

## 2016-06-09 DIAGNOSIS — Z1389 Encounter for screening for other disorder: Secondary | ICD-10-CM | POA: Diagnosis not present

## 2016-06-09 DIAGNOSIS — E7800 Pure hypercholesterolemia, unspecified: Secondary | ICD-10-CM | POA: Diagnosis not present

## 2016-06-09 DIAGNOSIS — Z0000 Encounter for general adult medical examination without abnormal findings: Secondary | ICD-10-CM | POA: Diagnosis not present

## 2016-06-09 DIAGNOSIS — H9190 Unspecified hearing loss, unspecified ear: Secondary | ICD-10-CM | POA: Diagnosis not present

## 2016-06-09 DIAGNOSIS — F324 Major depressive disorder, single episode, in partial remission: Secondary | ICD-10-CM | POA: Diagnosis not present

## 2016-06-16 DIAGNOSIS — M0609 Rheumatoid arthritis without rheumatoid factor, multiple sites: Secondary | ICD-10-CM | POA: Diagnosis not present

## 2016-06-20 DIAGNOSIS — H903 Sensorineural hearing loss, bilateral: Secondary | ICD-10-CM | POA: Diagnosis not present

## 2016-06-23 DIAGNOSIS — R112 Nausea with vomiting, unspecified: Secondary | ICD-10-CM | POA: Diagnosis not present

## 2016-06-23 DIAGNOSIS — R509 Fever, unspecified: Secondary | ICD-10-CM | POA: Diagnosis not present

## 2016-06-23 DIAGNOSIS — J209 Acute bronchitis, unspecified: Secondary | ICD-10-CM | POA: Diagnosis not present

## 2016-07-03 DIAGNOSIS — E538 Deficiency of other specified B group vitamins: Secondary | ICD-10-CM | POA: Diagnosis not present

## 2016-07-16 DIAGNOSIS — Z79899 Other long term (current) drug therapy: Secondary | ICD-10-CM | POA: Diagnosis not present

## 2016-07-16 DIAGNOSIS — M0609 Rheumatoid arthritis without rheumatoid factor, multiple sites: Secondary | ICD-10-CM | POA: Diagnosis not present

## 2016-08-04 DIAGNOSIS — E538 Deficiency of other specified B group vitamins: Secondary | ICD-10-CM | POA: Diagnosis not present

## 2016-08-05 DIAGNOSIS — M353 Polymyalgia rheumatica: Secondary | ICD-10-CM | POA: Diagnosis not present

## 2016-08-05 DIAGNOSIS — M0609 Rheumatoid arthritis without rheumatoid factor, multiple sites: Secondary | ICD-10-CM | POA: Diagnosis not present

## 2016-08-05 DIAGNOSIS — Z6828 Body mass index (BMI) 28.0-28.9, adult: Secondary | ICD-10-CM | POA: Diagnosis not present

## 2016-08-05 DIAGNOSIS — M2550 Pain in unspecified joint: Secondary | ICD-10-CM | POA: Diagnosis not present

## 2016-08-05 DIAGNOSIS — M159 Polyosteoarthritis, unspecified: Secondary | ICD-10-CM | POA: Diagnosis not present

## 2016-08-05 DIAGNOSIS — Z79899 Other long term (current) drug therapy: Secondary | ICD-10-CM | POA: Diagnosis not present

## 2016-08-05 DIAGNOSIS — E663 Overweight: Secondary | ICD-10-CM | POA: Diagnosis not present

## 2016-08-06 DIAGNOSIS — M170 Bilateral primary osteoarthritis of knee: Secondary | ICD-10-CM | POA: Diagnosis not present

## 2016-08-19 DIAGNOSIS — M0609 Rheumatoid arthritis without rheumatoid factor, multiple sites: Secondary | ICD-10-CM | POA: Diagnosis not present

## 2016-08-28 DIAGNOSIS — M170 Bilateral primary osteoarthritis of knee: Secondary | ICD-10-CM | POA: Diagnosis not present

## 2016-09-04 DIAGNOSIS — M170 Bilateral primary osteoarthritis of knee: Secondary | ICD-10-CM | POA: Diagnosis not present

## 2016-09-04 DIAGNOSIS — E538 Deficiency of other specified B group vitamins: Secondary | ICD-10-CM | POA: Diagnosis not present

## 2016-09-08 ENCOUNTER — Ambulatory Visit (INDEPENDENT_AMBULATORY_CARE_PROVIDER_SITE_OTHER): Payer: Medicare Other

## 2016-09-08 ENCOUNTER — Encounter (INDEPENDENT_AMBULATORY_CARE_PROVIDER_SITE_OTHER): Payer: Self-pay | Admitting: Orthopedic Surgery

## 2016-09-08 ENCOUNTER — Ambulatory Visit (INDEPENDENT_AMBULATORY_CARE_PROVIDER_SITE_OTHER): Payer: Medicare Other | Admitting: Orthopedic Surgery

## 2016-09-08 VITALS — Ht 66.0 in | Wt 170.0 lb

## 2016-09-08 DIAGNOSIS — M79672 Pain in left foot: Secondary | ICD-10-CM

## 2016-09-08 NOTE — Progress Notes (Signed)
Office Visit Note   Patient: Marilyn Perez           Date of Birth: 05/16/1950           MRN: [id number] Visit Date: 09/08/2016              Requested by: Seward Carol, MD [address] [place], [place] [address] PCP: Seward Carol, MD  Chief Complaint  Patient presents with  . Left Foot - Pain    Left foot fusion 1st metatarsal medial cuneiform 07/24/15      HPI: Patient is a 66 year old woman who is about a year out from effusion base of the first metatarsal medial cuneiform. She states she still has swelling across the midfoot. She complains of pain from his swelling.  Assessment & Plan: Visit Diagnoses:  1. Pain in left foot     Plan: Recommended a stiffer soled walking shoe she is currently wearing very thin shoes that cause swelling just proximal to the shoe. Patient states she cannot wear a stiffer soled shoe that she does not like wearing the laces. Recommended a 15-20 mm knee-high compression stocking to help decrease the swelling from the venous swelling. Patient states she understands will follow-up as needed.  Follow-Up Instructions: Return if symptoms worsen or fail to improve.   Ortho Exam  Patient is alert, oriented, no adenopathy, well-dressed, normal affect, normal respiratory effort. Examination she has a good pulse she does have some skin color changes consistent with venous insufficiency see does have some prominent veins. She has a good pulse she has no hypersensitivity to light touch. Where her shoe wear stop the middle of her foot she has swelling proximal to where the shoe stops. There is no cellulitis she has no pain with distraction across the midfoot or forefoot. No evidence of a nonunion or malunion. No dystrophic changes no signs of infection.  Imaging: Xr Foot Complete Left  Result Date: 09/08/2016 Three-view radiographs of the left foot shows stable fusion across the base of the first metatarsal medial cuneiform. There is no  widening at the Lisfranc complex. No stress fractures no hardware failure.   Labs: Lab Results  Component Value Date   HGBA1C 5.8 (H) 10/20/2014    Orders:  Orders Placed This Encounter  Procedures  . XR Foot Complete Left   No orders of the defined types were placed in this encounter.    Procedures: No procedures performed  Clinical Data: No additional findings.  ROS:  All other systems negative, except as noted in the HPI. Review of Systems  Objective: Vital Signs: Ht 5\' 6"  (1.676 m)   Wt 170 lb (77.1 kg)   BMI 27.44 kg/m   Specialty Comments:  No specialty comments available.  PMFS History: Patient Active Problem List   Diagnosis Date Noted  . Hyperlipidemia 10/21/2014  . Chest pain with moderate risk for cardiac etiology 10/20/2014  . Cervical stenosis of spinal canal 03/10/2014  . TOBACCO DEPENDENCE 07/02/2006  . RESTLESS LEGS SYNDROME 07/02/2006  . MITRAL VALVE PROLAPSE 07/02/2006  . MENOPAUSAL SYNDROME 07/02/2006  . DJD, UNSPECIFIED 07/02/2006  . DISC WITH RADICULOPATHY 07/02/2006   Past Medical History:  Diagnosis Date  . Anxiety   . Arthritis    ra  . Hyperlipidemia 10/21/2014    Family History  Problem Relation Age of Onset  . Congestive Heart Failure Mother     Past Surgical History:  Procedure Laterality Date  . ABDOMINAL HYSTERECTOMY    .  ANTERIOR CERVICAL DECOMP/DISCECTOMY FUSION N/A 03/10/2014   Procedure: Cervical five-six Anterior Cervical Discectomy with  Fusion and Plating ;  Surgeon: Faythe Ghee, MD;  Location: [facility];  Service: Neurosurgery;  Laterality: N/A;  . BACK SURGERY    . CARPAL TUNNEL RELEASE    . EYE SURGERY     Social History   Occupational History  . Not on file.   Social History Main Topics  . Smoking status: Current Every Day Smoker    Packs/day: 0.50    Years: 6.00    Types: Cigarettes  . Smokeless tobacco: Never Used  . Alcohol use No  . Drug use: No  . Sexual activity: Not on file

## 2016-09-09 ENCOUNTER — Other Ambulatory Visit: Payer: Self-pay | Admitting: Internal Medicine

## 2016-09-09 ENCOUNTER — Ambulatory Visit
Admission: RE | Admit: 2016-09-09 | Discharge: 2016-09-09 | Disposition: A | Discharge status: Home / Self Care | Payer: Medicare Other | Source: Ambulatory Visit | Attending: Internal Medicine | Admitting: Internal Medicine

## 2016-09-09 DIAGNOSIS — Z1231 Encounter for screening mammogram for malignant neoplasm of breast: Secondary | ICD-10-CM

## 2016-09-11 DIAGNOSIS — M170 Bilateral primary osteoarthritis of knee: Secondary | ICD-10-CM | POA: Diagnosis not present

## 2016-09-22 DIAGNOSIS — M0609 Rheumatoid arthritis without rheumatoid factor, multiple sites: Secondary | ICD-10-CM | POA: Diagnosis not present

## 2016-10-06 DIAGNOSIS — E538 Deficiency of other specified B group vitamins: Secondary | ICD-10-CM | POA: Diagnosis not present

## 2016-10-21 DIAGNOSIS — M0609 Rheumatoid arthritis without rheumatoid factor, multiple sites: Secondary | ICD-10-CM | POA: Diagnosis not present

## 2016-11-04 DIAGNOSIS — M0609 Rheumatoid arthritis without rheumatoid factor, multiple sites: Secondary | ICD-10-CM | POA: Diagnosis not present

## 2016-11-04 DIAGNOSIS — Z6829 Body mass index (BMI) 29.0-29.9, adult: Secondary | ICD-10-CM | POA: Diagnosis not present

## 2016-11-04 DIAGNOSIS — E663 Overweight: Secondary | ICD-10-CM | POA: Diagnosis not present

## 2016-11-04 DIAGNOSIS — M159 Polyosteoarthritis, unspecified: Secondary | ICD-10-CM | POA: Diagnosis not present

## 2016-11-04 DIAGNOSIS — Z79899 Other long term (current) drug therapy: Secondary | ICD-10-CM | POA: Diagnosis not present

## 2016-11-04 DIAGNOSIS — M2550 Pain in unspecified joint: Secondary | ICD-10-CM | POA: Diagnosis not present

## 2016-11-04 DIAGNOSIS — M353 Polymyalgia rheumatica: Secondary | ICD-10-CM | POA: Diagnosis not present

## 2016-11-06 DIAGNOSIS — E538 Deficiency of other specified B group vitamins: Secondary | ICD-10-CM | POA: Diagnosis not present

## 2016-11-16 ENCOUNTER — Emergency Department (HOSPITAL_COMMUNITY)
Admission: EM | Admit: 2016-11-16 | Discharge: 2016-11-16 | Disposition: A | Discharge status: Home / Self Care | Payer: Medicare Other | Attending: Emergency Medicine | Admitting: Emergency Medicine

## 2016-11-16 ENCOUNTER — Encounter (HOSPITAL_COMMUNITY): Payer: Self-pay

## 2016-11-16 DIAGNOSIS — Z79899 Other long term (current) drug therapy: Secondary | ICD-10-CM | POA: Diagnosis not present

## 2016-11-16 DIAGNOSIS — M5442 Lumbago with sciatica, left side: Secondary | ICD-10-CM | POA: Diagnosis not present

## 2016-11-16 DIAGNOSIS — M5432 Sciatica, left side: Secondary | ICD-10-CM | POA: Diagnosis not present

## 2016-11-16 DIAGNOSIS — Z7982 Long term (current) use of aspirin: Secondary | ICD-10-CM | POA: Diagnosis not present

## 2016-11-16 DIAGNOSIS — F17210 Nicotine dependence, cigarettes, uncomplicated: Secondary | ICD-10-CM | POA: Insufficient documentation

## 2016-11-16 DIAGNOSIS — R52 Pain, unspecified: Secondary | ICD-10-CM | POA: Diagnosis not present

## 2016-11-16 DIAGNOSIS — M25552 Pain in left hip: Secondary | ICD-10-CM | POA: Diagnosis not present

## 2016-11-16 HISTORY — DX: Other specified disorders of bone density and structure, unspecified site: M85.80

## 2016-11-16 MED ORDER — HYDROMORPHONE HCL 2 MG/ML IJ SOLN
2.0000 mg | Freq: Once | INTRAMUSCULAR | Status: AC
Start: 2016-11-16 — End: 2016-11-16
  Administered 2016-11-16: 2 mg via INTRAMUSCULAR
  Filled 2016-11-16: qty 1

## 2016-11-16 MED ORDER — LORAZEPAM 2 MG/ML IJ SOLN
2.0000 mg | Freq: Once | INTRAMUSCULAR | Status: AC
  Administered 2016-11-16: 2 mg via INTRAMUSCULAR
  Filled 2016-11-16: qty 1

## 2016-11-16 MED ORDER — DIAZEPAM 10 MG PO TABS: 10.0000 mg | ORAL_TABLET | Freq: Three times a day (TID) | ORAL | 0 refills | Status: DC | PRN

## 2016-11-16 MED ORDER — ONDANSETRON 8 MG PO TBDP
8.0000 mg | ORAL_TABLET | Freq: Once | ORAL | Status: AC
  Administered 2016-11-16: 8 mg via ORAL
  Filled 2016-11-16: qty 1

## 2016-11-16 MED ORDER — PREDNISONE 10 MG PO TABS
ORAL_TABLET | ORAL | 0 refills | Status: DC
Start: 2016-11-16 — End: 2019-02-21

## 2016-11-16 MED ORDER — PREDNISONE 20 MG PO TABS
60.0000 mg | ORAL_TABLET | Freq: Once | ORAL | Status: AC
  Administered 2016-11-16: 60 mg via ORAL
  Filled 2016-11-16: qty 3

## 2016-11-16 MED ORDER — OXYCODONE-ACETAMINOPHEN 5-325 MG PO TABS: 1.0000 | ORAL_TABLET | ORAL | 0 refills | Status: DC | PRN

## 2016-11-16 NOTE — ED Provider Notes (Signed)
[place] DEPT Provider Note   CSN: [id number] Arrival date & time: 11/16/16  2706     History   Chief Complaint Chief Complaint  Patient presents with  . Hip Pain    left    HPI Marilyn Perez is a 66 y.o. female.  She presents for evaluation of left lower back pain, radiating to left buttock and left lower leg.  Onset pain insidious, 2 days ago, without trauma.  She has rheumatoid arthritis and takes narcotic pain medicine regularly, but it is not helping her pain today.  Pain is worse with attempts at standing or ambulation.  She denies inability to move her legs, urinary incontinence, fecal incontinence, paresthesia, or weakness.  There are no other known modifying factors.  HPI  Past Medical History:  Diagnosis Date  . Anxiety   . Arthritis    ra  . Hyperlipidemia 10/21/2014  . Osteopenia     Patient Active Problem List   Diagnosis Date Noted  . Hyperlipidemia 10/21/2014  . Chest pain with moderate risk for cardiac etiology 10/20/2014  . Cervical stenosis of spinal canal 03/10/2014  . TOBACCO DEPENDENCE 07/02/2006  . RESTLESS LEGS SYNDROME 07/02/2006  . MITRAL VALVE PROLAPSE 07/02/2006  . MENOPAUSAL SYNDROME 07/02/2006  . DJD, UNSPECIFIED 07/02/2006  . DISC WITH RADICULOPATHY 07/02/2006    Past Surgical History:  Procedure Laterality Date  . ABDOMINAL HYSTERECTOMY    . ANTERIOR CERVICAL DECOMP/DISCECTOMY FUSION N/A 03/10/2014   Procedure: Cervical five-six Anterior Cervical Discectomy with  Fusion and Plating ;  Surgeon: Faythe Ghee, MD;  Location: [facility];  Service: Neurosurgery;  Laterality: N/A;  . BACK SURGERY    . BREAST EXCISIONAL BIOPSY    . CARPAL TUNNEL RELEASE    . CESAREAN SECTION     x 3  . EYE SURGERY      OB History    No data available       Home Medications    Prior to Admission medications   Medication Sig Start Date End Date Taking? Authorizing Provider  Abatacept (ORENCIA IV) Inject into the vein every 30  (thirty) days.    [provider]  aspirin EC 81 MG EC tablet Take 1 tablet (81 mg total) by mouth daily. 10/21/14   Almyra Deforest, PA  atorvastatin (LIPITOR) 20 MG tablet Take 1 tablet (20 mg total) by mouth daily at 6 PM. 10/21/14   Almyra Deforest, PA  calcium citrate-vitamin D (CITRACAL+D) 315-200 MG-UNIT tablet Take 1 tablet by mouth 2 (two) times daily.    [provider]  CHANTIX CONTINUING MONTH PAK 1 MG tablet  09/03/16   [provider]  Cyanocobalamin (VITAMIN B-12 IJ) Inject as directed every 30 (thirty) days.    [provider]  DULoxetine (CYMBALTA) 60 MG capsule Take 60 mg by mouth daily.    [provider]  HYDROcodone-acetaminophen (NORCO/VICODIN) 5-325 MG tablet Take 1 tablet by mouth every 6 (six) hours as needed for moderate pain.    [provider]  leflunomide (ARAVA) 20 MG tablet Take 20 mg by mouth daily.    [provider]  Misc Natural Products (MIDNITE PO) Take 2 tablets by mouth at bedtime.    [provider]  predniSONE (DELTASONE) 5 MG tablet Take 5 mg by mouth daily with breakfast.    [provider]  rOPINIRole (REQUIP) 0.5 MG tablet Take 1 mg by mouth at bedtime.    [provider]    Family History Family  History  Problem Relation Age of Onset  . Congestive Heart Failure Mother   . COPD Mother   . Heart failure Mother   . Cancer Father     Social History Social History  Substance Use Topics  . Smoking status: Current Every Day Smoker    Packs/day: 0.50    Years: 6.00    Types: Cigarettes  . Smokeless tobacco: Never Used  . Alcohol use No     Allergies   Penicillins; Remicade [infliximab]; and Sulfa antibiotics   Review of Systems Review of Systems  All other systems reviewed and are negative.    Physical Exam Updated Vital Signs BP (!) 147/80 (BP Location: Right Arm)   Pulse 77   Temp 97.9 F (36.6 C) (Oral)   Resp 14   Ht 5\' 5"  (1.651 m)   Wt 81.6 kg (180  lb)   SpO2 97%   BMI 29.95 kg/m   Physical Exam  Constitutional: She is oriented to person, place, and time. She appears well-developed and well-nourished.  HENT:  Head: Normocephalic and atraumatic.  Eyes: Pupils are equal, round, and reactive to light. Conjunctivae and EOM are normal.  Neck: Normal range of motion and phonation normal. Neck supple.  Cardiovascular: Normal rate and regular rhythm.   Pulmonary/Chest: Effort normal and breath sounds normal. She exhibits no tenderness.  Musculoskeletal: Normal range of motion.  Lying right lateral, decubitus, recumbent, with left hip and knee flexed.  She resists movement of the left leg secondary to lower back pain.  No palpable tenderness of the lumbar region, bilaterally.  Neurological: She is alert and oriented to person, place, and time. She exhibits normal muscle tone.  Skin: Skin is warm and dry.  Psychiatric: She has a normal mood and affect. Her behavior is normal. Judgment and thought content normal.  Nursing note and vitals reviewed.    ED Treatments / Results  Labs (all labs ordered are listed, but only abnormal results are displayed) Labs Reviewed - No data to display  EKG  EKG Interpretation None       Radiology No results found.  Procedures Procedures (including critical care time)  Medications Ordered in ED Medications  HYDROmorphone (DILAUDID) injection 2 mg (2 mg Intramuscular Given 11/16/16 1015)  LORazepam (ATIVAN) injection 2 mg (2 mg Intramuscular Given 11/16/16 1014)  predniSONE (DELTASONE) tablet 60 mg (60 mg Oral Given 11/16/16 1012)  ondansetron (ZOFRAN-ODT) disintegrating tablet 8 mg (8 mg Oral Given 11/16/16 1013)     Initial Impression / Assessment and Plan / ED Course  I have reviewed the triage vital signs and the nursing notes.  Pertinent labs & imaging results that were available during my care of the patient were reviewed by me and considered in my medical decision making (see chart for  details).  Clinical Course as of Nov 16 1112  Sun Nov 16, 2016  0934 Initial evaluation is consistent with sciatica.  Review of prior MRI, 2004 indicates degenerative change at that time, which has likely progressed.  Doubt cauda equina syndrome, or rheumatoid arthritis flare as the source of her sciatica presentation.  [EW]  1610 At this time she reports that her pain is much better.  He is resting comfortably in supine position.  She will try ambulating, to see if that is improved as well.  [EW]    Clinical Course User Index [EW] Daleen Bo, MD     Patient Vitals for the past 24 hrs:  BP Temp Temp src Pulse Resp  SpO2 Height Weight  11/16/16 0907 - - - - - - 5\' 5"  (1.651 m) 81.6 kg (180 lb)  11/16/16 0902 (!) 147/80 97.9 F (36.6 C) Oral 77 14 97 % - -    11:14 AM Reevaluation with update and discussion. After initial assessment and treatment, an updated evaluation reveals she was able to ambulate independently, to the bathroom and back.  Caused her pain to elevate, which then improved after she rested.  Findings discussed with the patient and all questions were answered. Marilyn Perez    Final Clinical Impressions(s) / ED Diagnoses   Final diagnoses:  Sciatica of left side   Sciatica, etiology not clear.  Doubt cauda equina syndrome or lumbar myelopathy.   Nursing Notes Reviewed/ Care Coordinated Applicable Imaging Reviewed Interpretation of Laboratory Data incorporated into ED treatment  The patient appears reasonably screened and/or stabilized for discharge and I doubt any other medical condition or other [facility] requiring further screening, evaluation, or treatment in the ED at this time prior to discharge.  Plan: Home Medications-change hydrocodone to oxycodone, continue other medications; Home Treatments-rest, heat; return here if the recommended treatment, does not improve the symptoms; Recommended follow up-PCP, as soon as possible to arrange further evaluation with  likely MRI imaging, and referral as needed    New Prescriptions New Prescriptions   No medications on file     Daleen Bo, MD 11/16/16 1125

## 2016-11-16 NOTE — ED Notes (Signed)
Bed: WA17 Expected date: 11/16/16 Expected time: 8:22 AM Means of arrival: Ambulance Comments: Hip Pain

## 2016-11-16 NOTE — ED Notes (Signed)
Patient ambulated to the bathroom and back with very little assistance Patient stated her pain increased to 8/10 and was 6/10 prior to ambulating.

## 2016-11-16 NOTE — Discharge Instructions (Signed)
Use heat on the sore area 3 or 4 times a day.  We are changing your prescription for prednisone, for now, but you should revert to the usual 5 mg a day when you finish the tapering dose.  Do not drive, operate machinery, or climb ladders while taking the pain medicine and muscle relaxer.

## 2016-11-17 DIAGNOSIS — Z1211 Encounter for screening for malignant neoplasm of colon: Secondary | ICD-10-CM | POA: Diagnosis not present

## 2016-11-18 DIAGNOSIS — M0609 Rheumatoid arthritis without rheumatoid factor, multiple sites: Secondary | ICD-10-CM | POA: Diagnosis not present

## 2016-11-19 ENCOUNTER — Other Ambulatory Visit: Payer: Self-pay | Admitting: Internal Medicine

## 2016-11-19 DIAGNOSIS — M5430 Sciatica, unspecified side: Secondary | ICD-10-CM

## 2016-11-21 DIAGNOSIS — M2548 Effusion, other site: Secondary | ICD-10-CM | POA: Diagnosis not present

## 2016-11-21 DIAGNOSIS — M4727 Other spondylosis with radiculopathy, lumbosacral region: Secondary | ICD-10-CM | POA: Diagnosis not present

## 2016-11-21 DIAGNOSIS — M7138 Other bursal cyst, other site: Secondary | ICD-10-CM | POA: Diagnosis not present

## 2016-11-21 DIAGNOSIS — M5117 Intervertebral disc disorders with radiculopathy, lumbosacral region: Secondary | ICD-10-CM | POA: Diagnosis not present

## 2016-11-21 DIAGNOSIS — M5116 Intervertebral disc disorders with radiculopathy, lumbar region: Secondary | ICD-10-CM | POA: Diagnosis not present

## 2016-11-21 DIAGNOSIS — M4726 Other spondylosis with radiculopathy, lumbar region: Secondary | ICD-10-CM | POA: Diagnosis not present

## 2016-12-03 DIAGNOSIS — M5416 Radiculopathy, lumbar region: Secondary | ICD-10-CM | POA: Diagnosis not present

## 2016-12-03 DIAGNOSIS — M7138 Other bursal cyst, other site: Secondary | ICD-10-CM | POA: Diagnosis not present

## 2016-12-08 DIAGNOSIS — E538 Deficiency of other specified B group vitamins: Secondary | ICD-10-CM | POA: Diagnosis not present

## 2016-12-16 ENCOUNTER — Ambulatory Visit: Payer: Medicare Other | Attending: Neurosurgery | Admitting: Physical Therapy

## 2016-12-16 DIAGNOSIS — M5442 Lumbago with sciatica, left side: Secondary | ICD-10-CM

## 2016-12-16 DIAGNOSIS — M6281 Muscle weakness (generalized): Secondary | ICD-10-CM | POA: Insufficient documentation

## 2016-12-16 NOTE — Therapy (Signed)
[facility] [address] [place], [place], [address] Phone: [phone]   Fax:  [phone]  Physical Therapy Evaluation  Patient Details  Name: Marilyn Perez MRN: [id number] Date of Birth: 1950-05-28 Referring Provider: Dr. Kathyrn Sheriff  Encounter Date: 12/16/2016      PT End of Session - 12/16/16 1623    Visit Number 1   Date for PT Re-Evaluation 02/10/17   Authorization Type Medicare G codes; KX at visit 15   PT Start Time 1534   PT Stop Time 1618   PT Time Calculation (min) 44 min   Activity Tolerance Patient tolerated treatment well      Past Medical History:  Diagnosis Date  . Anxiety   . Arthritis    ra  . Hyperlipidemia 10/21/2014  . Osteopenia     Past Surgical History:  Procedure Laterality Date  . ABDOMINAL HYSTERECTOMY    . ANTERIOR CERVICAL DECOMP/DISCECTOMY FUSION N/A 03/10/2014   Procedure: Cervical five-six Anterior Cervical Discectomy with  Fusion and Plating ;  Surgeon: Faythe Ghee, MD;  Location: [facility];  Service: Neurosurgery;  Laterality: N/A;  . BACK SURGERY    . BREAST EXCISIONAL BIOPSY    . CARPAL TUNNEL RELEASE    . CESAREAN SECTION     x 3  . EYE SURGERY      There were no vitals filed for this visit.       Subjective Assessment - 12/16/16 1535    Subjective On 7/15 get out of bed the pain was excruciating from hip down to lower leg;  took an ambulance to hospital.  Cyst on left side, bulging disc on right;  Sees a radiologist next week for possible injection?     Pertinent History RA with knee flare up;  had for 13 years;  2 neck surgeries;  multi abdominal surgeries;  left foot surgery 1 year ago;  osteopenia/osteoporosis   Limitations House hold activities;Sitting;Standing   How long can you sit comfortably? depends on chair 5 minutes   How long can you walk comfortably? < 20 minutes   Diagnostic tests MRI cyst on left   Patient Stated Goals I want to get  rid of the pain   Currently in Pain? No/denies   Pain Score 0-No pain   Pain Location Back   Pain Orientation Left   Pain Type Acute pain   Pain Onset More than a month ago   Pain Frequency Intermittent   Aggravating Factors  sitting or standing;  driving; walking;  no lift > 5 #;  uses a cane in the morning b/c pain is so bad;  mid afternoon   Pain Relieving Factors lie down on back;  take a diazepam            [facility] PT Assessment - 12/16/16 0001      Assessment   Medical Diagnosis lumbar radiculopathy   Referring Provider Dr. Kathyrn Sheriff   Onset Date/Surgical Date 11/16/16   Next MD Visit not scheduled   Prior Therapy no  hands for CTS     Precautions   Precaution Comments RA no lift > 5#     Restrictions   Weight Bearing Restrictions No     Balance Screen   Has the patient fallen in the past 6 months No   Has the patient had a decrease in activity level because of a fear of falling?  Yes  my balance is getting bad   Is the  patient reluctant to leave their home because of a fear of falling?  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; play games; walk with neighbors      Observation/Other Assessments   Focus on Therapeutic Outcomes (FOTO)  49%     Posture/Postural Control   Posture/Postural Control No significant limitations   Posture Comments Pelvic drop with SLS left > right     AROM   Lumbar Flexion 52   Lumbar Extension 15   Lumbar - Right Side Bend 32   Lumbar - Left Side Bend 40     Strength   Right Hip Extension 4/5   Right Hip ABduction 4/5   Left Hip Extension 4-/5   Left Hip ABduction 3/5      Flexibility   Hamstrings left 70, right 75     Palpation   Palpation comment multiple tender points left gluteals and piriformis     Slump test   Findings Negative     Prone Knee Bend Test   Findings Negative     Straight Leg Raise   Findings Negative            Objective measurements completed on examination: See above findings.                  PT Education - 12/16/16 1623    Education provided Yes   Education Details abdominal brace; clams; dry needling info   Person(s) Educated Patient   Methods Explanation;Demonstration;Handout   Comprehension Verbalized understanding;Returned demonstration          PT Short Term Goals - 12/16/16 1647      PT SHORT TERM GOAL #1   Title The patient will report a good understanding of initial self management techniques and HEP   01/13/17   Time 4   Period Weeks   Status New   Target Date 01/13/17     PT SHORT TERM GOAL #2   Title The patient will report a 25% improvement in back and left LE symptoms with sitting, standing and in the morning   Time 4   Period Weeks   Status New     PT SHORT TERM GOAL #3   Title Improved HS soft tissue length to 80 degrees bilaterally needed for improved with household chores and caring for her dogs   Time 4   Period Weeks   Status New           PT Long Term Goals - 12/16/16 1650      PT LONG TERM GOAL #1   Title The patient will be independent in safe self progression of HEP for further gains in ROM, strength and function     Time 8   Period Weeks   Status New   Target Date 02/10/17     PT LONG TERM GOAL #2   Title The patient will report a 60% reduction in back and left LE pain with sitting, standing, driving and in the mornings   Time 8   Period Weeks   Status New  PT LONG TERM GOAL #3   Title The patient will have improved lumbo-pelvic core strength to 4/5 needed for standing/walking for > 20 minutes   Time 8   Period Weeks   Status New     PT LONG  TERM GOAL #4   Title Left hip strength grossly 4/5 needed for standing and walking longer periods of time with minimal discomfort   Time 8   Period Weeks   Status New     PT LONG TERM GOAL #5   Title FOTO functional outcome score improved from 49% limitation to 32% indicating improved function with less pain   Time 8   Period Weeks   Status New                Plan - 12/16/16 1637    Clinical Impression Statement The patient has a 13 year history of rheumatoid arthritis with some history of low back pain however she woke up on 11/16/16 with excruciating low back pain with radiating left buttock and posterior/lateral  left LE pain to her foot.  She had to be transported to the hospital via ambulance.  MRI showed a bulging disc on the right and a left cyst.  She continues to have pain across her low back and left LE symptoms with sitting, driving, standing and in the mornings and afternoon.  She uses a cane in the mornings.  Decreased lumbar flexion and extension ROM.  Significant weakness in lower abdominals and left hip abductors and extensors.  Tender points in left gluteals and piriformis.  She would benefit from PT to address these deficits.     History and Personal Factors relevant to plan of care: Multi body regions affected;  numerous co-morbidities including RA, osteopenia/osteoporosis; numerous abdominal surgeries, left foot surgery 1 year ago; 2 neck surgeries   Clinical Presentation Evolving   Clinical Presentation due to: new onset of radiating LE pain and weakness 1 month ago   Rehab Potential Good   Clinical Impairments Affecting Rehab Potential none   PT Frequency 2x / week   PT Duration 8 weeks   PT Treatment/Interventions ADLs/Self Care Home Management;Electrical Stimulation;Cryotherapy;Ultrasound;Moist Heat;Patient/family education;Neuromuscular re-education;Therapeutic exercise;Therapeutic activities;Manual techniques;Taping;Dry needling   PT Next Visit Plan review  clams and abdominal bracing and progress;  add HS and piriformis gentle stretch to HEP;  dry needling to left gluteals and piriformis      Patient will benefit from skilled therapeutic intervention in order to improve the following deficits and impairments:  Decreased range of motion, Decreased strength, Increased fascial restricitons, Pain  Visit Diagnosis: Acute bilateral low back pain with left-sided sciatica - Plan: PT plan of care cert/re-cert  Muscle weakness (generalized) - Plan: PT plan of care cert/re-cert      G-Codes - 25/36/64 1654    Functional Assessment Tool Used (Outpatient Only) FOTO; clinical impression   Functional Limitation Mobility: Walking and moving around   Mobility: Walking and Moving Around Current Status (Q0347) At least 40 percent but less than 60 percent impaired, limited or restricted   Mobility: Walking and Moving Around Goal Status (Q2595) At least 20 percent but less than 40 percent impaired, limited or restricted       Problem List Patient Active Problem List   Diagnosis Date Noted  . Hyperlipidemia 10/21/2014  . Chest pain with moderate risk for cardiac etiology 10/20/2014  . Cervical stenosis of spinal canal 03/10/2014  . TOBACCO DEPENDENCE 07/02/2006  . RESTLESS LEGS SYNDROME 07/02/2006  . MITRAL VALVE  PROLAPSE 07/02/2006  . MENOPAUSAL SYNDROME 07/02/2006  . DJD, UNSPECIFIED 07/02/2006  . [place] WITH RADICULOPATHY 07/02/2006   Ruben Im, PT 12/16/16 5:01 PM Phone: [phone] Fax: [phone]  Alvera Singh 12/16/2016, 5:01 PM  [facility] [address], [place], [place], [address] Phone: [phone]   Fax:  [phone]  Name: Marilyn Perez MRN: [id number] Date of Birth: [birth year]

## 2016-12-16 NOTE — Patient Instructions (Signed)
   Abduction: Clam (Eccentric) - Side-Lying   Lie on side with knees bent. Lift top knee, keeping feet together. Keep trunk steady. Slowly lower for 3-5 seconds. 10-15___ reps per set, _1__ sets per day, __7_ days per week   Copyright  VHI. All rights reserved.       Ruben Im PT [facility] [address] [place], [place] [address] Phone # [phone] Fax [phone]

## 2016-12-19 ENCOUNTER — Ambulatory Visit: Payer: Medicare Other | Admitting: Physical Therapy

## 2016-12-19 DIAGNOSIS — M5442 Lumbago with sciatica, left side: Secondary | ICD-10-CM | POA: Diagnosis not present

## 2016-12-19 DIAGNOSIS — M6281 Muscle weakness (generalized): Secondary | ICD-10-CM

## 2016-12-19 NOTE — Therapy (Signed)
[facility] [address], [place], [place], [address] Phone: [phone]   Fax:  [phone]  Physical Therapy Treatment  Patient Details  Name: Marilyn Perez MRN: [id number] Date of Birth: 1950/07/28 Referring Provider: Dr. Kathyrn Sheriff  Encounter Date: 12/19/2016      PT End of Session - 12/19/16 1106    Visit Number 2   Date for PT Re-Evaluation 02/10/17   Authorization Type Medicare G codes; KX at visit 15   PT Start Time 1106  dry needle   PT Stop Time 1150   PT Time Calculation (min) 44 min   Activity Tolerance Patient tolerated treatment well      Past Medical History:  Diagnosis Date  . Anxiety   . Arthritis    ra  . Hyperlipidemia 10/21/2014  . Osteopenia     Past Surgical History:  Procedure Laterality Date  . ABDOMINAL HYSTERECTOMY    . ANTERIOR CERVICAL DECOMP/DISCECTOMY FUSION N/A 03/10/2014   Procedure: Cervical five-six Anterior Cervical Discectomy with  Fusion and Plating ;  Surgeon: Faythe Ghee, MD;  Location: [place] [facility];  Service: Neurosurgery;  Laterality: N/A;  . BACK SURGERY    . BREAST EXCISIONAL BIOPSY    . CARPAL TUNNEL RELEASE    . CESAREAN SECTION     x 3  . EYE SURGERY      There were no vitals filed for this visit.      Subjective Assessment - 12/19/16 1106    Subjective Pain is about the same as it was   Pertinent History RA with knee flare up;  had for 13 years;  2 neck surgeries;  multi abdominal surgeries;  left foot surgery 1 year ago;  osteopenia/osteoporosis   How long can you sit comfortably? depends on chair 5 minutes   How long can you walk comfortably? < 20 minutes   Diagnostic tests MRI cyst on left   Patient Stated Goals I want to get rid of the pain   Pain Score 5    Pain Location Back   Pain Orientation Left   Pain Type Acute pain   Pain Onset More than a month ago   Pain Frequency Intermittent                          [facility] Adult PT Treatment/Exercise - 12/19/16 0001      Lumbar Exercises: Stretches   Piriformis Stretch 3 reps;20 seconds     Lumbar Exercises: Supine   Ab Set 5 reps   Bent Knee Raise 10 reps     Lumbar Exercises: Sidelying   Clam 10 reps     Manual Therapy   Manual Therapy Soft tissue mobilization;Myofascial release   Soft tissue mobilization bilateral glutes and lumbar paraspinals, Lt vastus lateralis   Myofascial Release left glutes          Trigger Point Dry Needling - 12/19/16 1222    Consent Given? Yes   Education Handout Provided Yes   Muscles Treated Lower Body Gluteus minimus;Gluteus maximus;Piriformis;Quadriceps  lumbar multifidi   Gluteus Maximus Response Twitch response elicited;Palpable increased muscle length   Gluteus Minimus Response Twitch response elicited;Palpable increased muscle length   Piriformis Response Twitch response elicited;Palpable increased muscle length   Quadriceps Response Twitch response elicited;Palpable increased muscle length  vastus lateralis                PT Short Term Goals - 12/16/16 1647  PT SHORT TERM GOAL #1   Title The patient will report a good understanding of initial self management techniques and HEP   01/13/17   Time 4   Period Weeks   Status New   Target Date 01/13/17     PT SHORT TERM GOAL #2   Title The patient will report a 25% improvement in back and left LE symptoms with sitting, standing and in the morning   Time 4   Period Weeks   Status New     PT SHORT TERM GOAL #3   Title Improved HS soft tissue length to 80 degrees bilaterally needed for improved with household chores and caring for her dogs   Time 4   Period Weeks   Status New           PT Long Term Goals - 12/16/16 1650      PT LONG TERM GOAL #1   Title The patient will be independent in safe self progression of HEP for further gains in ROM, strength and function     Time 8   Period Weeks   Status New   Target Date  02/10/17     PT LONG TERM GOAL #2   Title The patient will report a 60% reduction in back and left LE pain with sitting, standing, driving and in the mornings   Time 8   Period Weeks   Status New     PT LONG TERM GOAL #3   Title The patient will have improved lumbo-pelvic core strength to 4/5 needed for standing/walking for > 20 minutes   Time 8   Period Weeks   Status New     PT LONG TERM GOAL #4   Title Left hip strength grossly 4/5 needed for standing and walking longer periods of time with minimal discomfort   Time 8   Period Weeks   Status New     PT LONG TERM GOAL #5   Title FOTO functional outcome score improved from 49% limitation to 32% indicating improved function with less pain   Time 8   Period Weeks   Status New               Plan - 12/19/16 1113    Clinical Impression Statement No goals achieved due to first treatment since eval.  Patient was able to perform clamshell and abdominal bracing exercises but fatigued quickly after only 10 reps.  She demonstrates good form throughout.  Pt had very tender points that were lessened with increased tissue length after manual and dry needling.  She will benefit from skilled PT to improve soft tissue length and improve abilty to engage core during functional movements   PT Treatment/Interventions ADLs/Self Care Home Management;Electrical Stimulation;Cryotherapy;Ultrasound;Moist Heat;Patient/family education;Neuromuscular re-education;Therapeutic exercise;Therapeutic activities;Manual techniques;Taping;Dry needling   PT Next Visit Plan continue to progress core, gentle stretches HS and piriformis, assess response to DN to glutes, vastus lateralis, lumbar parapsinals   Consulted and Agree with Plan of Care Patient      Patient will benefit from skilled therapeutic intervention in order to improve the following deficits and impairments:  Decreased range of motion, Decreased strength, Increased fascial restricitons,  Pain  Visit Diagnosis: Acute bilateral low back pain with left-sided sciatica  Muscle weakness (generalized)     Problem List Patient Active Problem List   Diagnosis Date Noted  . Hyperlipidemia 10/21/2014  . Chest pain with moderate risk for cardiac etiology 10/20/2014  . Cervical stenosis of spinal canal 03/10/2014  .  TOBACCO DEPENDENCE 07/02/2006  . RESTLESS LEGS SYNDROME 07/02/2006  . MITRAL VALVE PROLAPSE 07/02/2006  . MENOPAUSAL SYNDROME 07/02/2006  . DJD, UNSPECIFIED 07/02/2006  . DISC WITH RADICULOPATHY 07/02/2006    Zannie Cove, PT 12/19/2016, 12:24 PM  [facility] [address] [place], [place], [address] Phone: [phone]   Fax:  [phone]  Name: Marilyn Perez MRN: [id number] Date of Birth: 28-Jun-1950

## 2016-12-22 DIAGNOSIS — Z6827 Body mass index (BMI) 27.0-27.9, adult: Secondary | ICD-10-CM | POA: Diagnosis not present

## 2016-12-22 DIAGNOSIS — M5416 Radiculopathy, lumbar region: Secondary | ICD-10-CM | POA: Diagnosis not present

## 2016-12-22 DIAGNOSIS — M7138 Other bursal cyst, other site: Secondary | ICD-10-CM | POA: Diagnosis not present

## 2016-12-23 DIAGNOSIS — M0609 Rheumatoid arthritis without rheumatoid factor, multiple sites: Secondary | ICD-10-CM | POA: Diagnosis not present

## 2016-12-25 ENCOUNTER — Ambulatory Visit: Payer: Medicare Other

## 2016-12-25 DIAGNOSIS — M5442 Lumbago with sciatica, left side: Secondary | ICD-10-CM

## 2016-12-25 DIAGNOSIS — M6281 Muscle weakness (generalized): Secondary | ICD-10-CM

## 2016-12-25 NOTE — Therapy (Signed)
[facility] [address] [place], [place], [address] Phone: [phone]   Fax:  [phone]  Physical Therapy Treatment  Patient Details  Name: Marilyn Perez MRN: [id number] Date of Birth: [birth year] Referring Provider: Dr. Kathyrn Sheriff  Encounter Date: 12/25/2016      PT End of Session - 12/25/16 1021    Visit Number 3   Date for PT Re-Evaluation 02/10/17   Authorization Type Medicare G codes; KX at visit 15   PT Start Time 1017   PT Stop Time 1115   PT Time Calculation (min) 58 min   Activity Tolerance Patient tolerated treatment well      Past Medical History:  Diagnosis Date  . Anxiety   . Arthritis    ra  . Hyperlipidemia 10/21/2014  . Osteopenia     Past Surgical History:  Procedure Laterality Date  . ABDOMINAL HYSTERECTOMY    . ANTERIOR CERVICAL DECOMP/DISCECTOMY FUSION N/A 03/10/2014   Procedure: Cervical five-six Anterior Cervical Discectomy with  Fusion and Plating ;  Surgeon: Faythe Ghee, MD;  Location: [place] [facility];  Service: Neurosurgery;  Laterality: N/A;  . BACK SURGERY    . BREAST EXCISIONAL BIOPSY    . CARPAL TUNNEL RELEASE    . CESAREAN SECTION     x 3  . EYE SURGERY      There were no vitals filed for this visit.      Subjective Assessment - 12/25/16 1020    Subjective Some soreness in B buttocks today from DN however notes she has felt some relief from back pain.     Patient Stated Goals I want to get rid of the pain   Currently in Pain? Yes   Pain Score 4    Pain Location Back   Pain Orientation Right;Left;Lower                         [facility] Adult PT Treatment/Exercise - 12/25/16 1050      Lumbar Exercises: Stretches   Passive Hamstring Stretch 2 reps;30 seconds   Passive Hamstring Stretch Limitations bil with strap    Piriformis Stretch 3 reps;30 seconds   Piriformis Stretch Limitations KTOS, Figure-4     Lumbar Exercises: Aerobic   Stationary Bike Bike: lvl 1, 8 min      Modalities   Modalities Moist Heat;Electrical Stimulation     Moist Heat Therapy   Number Minutes Moist Heat 15 Minutes   Moist Heat Location Hip  B buttocks      Electrical Stimulation   Electrical Stimulation Location B buttocks    Electrical Stimulation Action IFC   Electrical Stimulation Parameters 80-150Hz , intensity to pt. tolerance, 15'    Electrical Stimulation Goals Pain;Tone     Manual Therapy   Manual Therapy Soft tissue mobilization;Myofascial release   Manual therapy comments B sidelying with opposite LE elevated on bolster    Soft tissue mobilization B glute complex/piriformis    Myofascial Release B piriformis; limited tolerance                PT Education - 12/25/16 1421    Education provided Yes   Education Details Piriformis stretch    Person(s) Educated Patient   Methods Explanation;Demonstration;Verbal cues;Handout   Comprehension Verbalized understanding;Returned demonstration;Verbal cues required;Need further instruction          PT Short Term Goals - 12/25/16 1023      PT SHORT TERM GOAL #1  Title The patient will report a good understanding of initial self management techniques and HEP   01/13/17   Time 4   Period Weeks   Status On-going     PT SHORT TERM GOAL #2   Title The patient will report a 25% improvement in back and left LE symptoms with sitting, standing and in the morning   Time 4   Period Weeks   Status On-going     PT SHORT TERM GOAL #3   Title Improved HS soft tissue length to 80 degrees bilaterally needed for improved with household chores and caring for her dogs   Time 4   Period Weeks   Status On-going           PT Long Term Goals - 12/25/16 1023      PT LONG TERM GOAL #1   Title The patient will be independent in safe self progression of HEP for further gains in ROM, strength and function     Time 8   Period Weeks   Status On-going     PT LONG TERM GOAL #2    Title The patient will report a 60% reduction in back and left LE pain with sitting, standing, driving and in the mornings   Time 8   Period Weeks   Status On-going     PT LONG TERM GOAL #3   Title The patient will have improved lumbo-pelvic core strength to 4/5 needed for standing/walking for > 20 minutes   Time 8   Period Weeks   Status On-going     PT LONG TERM GOAL #4   Title Left hip strength grossly 4/5 needed for standing and walking longer periods of time with minimal discomfort   Time 8   Period Weeks   Status On-going     PT LONG TERM GOAL #5   Title FOTO functional outcome score improved from 49% limitation to 32% indicating improved function with less pain   Time 8   Period Weeks   Status On-going               Plan - 12/25/16 1426    Clinical Impression Statement Pt. doing well today noting she felt benefit from DN with less back pain.  Some residual buttocks soreness following DN however open to trying this again.  Some manual STM/TPR to glute complex/piriformis today with pt. still very tender bilaterally with limited tolerance. Figure-4 and modified piriformis/HS stretching tolerated well today thus HEP updated with piriformis stretching.  May consider further DN in future visits.   PT Treatment/Interventions ADLs/Self Care Home Management;Electrical Stimulation;Cryotherapy;Ultrasound;Moist Heat;Patient/family education;Neuromuscular re-education;Therapeutic exercise;Therapeutic activities;Manual techniques;Taping;Dry needling   PT Next Visit Plan Further DN to glute ? (pt. felt benefit); continue to progress core, gentle stretches HS and piriformis, vastus lateralis, lumbar parapsinals      Patient will benefit from skilled therapeutic intervention in order to improve the following deficits and impairments:  Decreased range of motion, Decreased strength, Increased fascial restricitons, Pain  Visit Diagnosis: Acute bilateral low back pain with left-sided  sciatica  Muscle weakness (generalized)     Problem List Patient Active Problem List   Diagnosis Date Noted  . Hyperlipidemia 10/21/2014  . Chest pain with moderate risk for cardiac etiology 10/20/2014  . Cervical stenosis of spinal canal 03/10/2014  . TOBACCO DEPENDENCE 07/02/2006  . RESTLESS LEGS SYNDROME 07/02/2006  . MITRAL VALVE PROLAPSE 07/02/2006  . MENOPAUSAL SYNDROME 07/02/2006  . DJD, UNSPECIFIED 07/02/2006  . DISC WITH RADICULOPATHY  07/02/2006    Bess Harvest, PTA 12/25/16 2:32 PM  [facility] [address] [place], [place], [address] Phone: [phone]   Fax:  [phone]  Name: Marilyn Perez MRN: [id number] Date of Birth: 18-Oct-1950

## 2016-12-29 ENCOUNTER — Encounter: Payer: Self-pay | Admitting: Physical Therapy

## 2016-12-29 ENCOUNTER — Ambulatory Visit: Payer: Medicare Other | Admitting: Physical Therapy

## 2016-12-29 DIAGNOSIS — M6281 Muscle weakness (generalized): Secondary | ICD-10-CM

## 2016-12-29 DIAGNOSIS — M5442 Lumbago with sciatica, left side: Secondary | ICD-10-CM

## 2016-12-29 NOTE — Patient Instructions (Addendum)
Positioning on Back in Bed    Place pillow under knees. Use towel roll or pillow under neck and/or head if needed. Any pillow under head and/or neck should be as small as possible but enough to protect alignment of this area.  Copyright  VHI. All rights reserved.  Eating    Sit, protecting natural arch in low back. Bring food to mouth, not mouth to food. Do not lean on elbows or arms.  Copyright  VHI. All rights reserved.  Standing Activities    During any prolonged standing activity (e.g., ironing, washing dishes, chopping vegetables), elevate one foot on a stool or on the shelf under the kitchen sink. Alternate feet.  Copyright  VHI. All rights reserved.  Lifting Principles  .Maintain proper posture and head alignment. .Slide object as close as possible before lifting. .Move obstacles out of the way. .Test before lifting; ask for help if too heavy. .Tighten stomach muscles without holding breath. .Use smooth movements; do not jerk. .Use legs to do the work, and pivot with feet. .Distribute the work load symmetrically and close to the center of trunk. .Push instead of pull whenever possible.  Copyright  VHI. All rights reserved.  Deep Squat    Squat and lift with both arms held against upper trunk. Tighten stomach muscles without holding breath. Use smooth movements to avoid jerking.  Copyright  VHI. All rights reserved.  Avoid Twisting    Avoid twisting or bending back. Pivot around using foot movements, and bend at knees if needed when reaching for articles.   Copyright  VHI. All rights reserved.  Bending    Bend at hips and knees, not back. Keep feet shoulder-width apart.   Copyright  VHI. All rights reserved.  [place] [address] [place], [place] [address] Phone # [phone] Fax [phone]

## 2016-12-29 NOTE — Therapy (Signed)
[facility] [address], [place], [place], [address] Phone: [phone]   Fax:  [phone]  Physical Therapy Treatment  Patient Details  Name: Marilyn Perez MRN: [id number] Date of Birth: 01/02/1951 Referring Provider: Dr. Kathyrn Sheriff  Encounter Date: 12/29/2016      PT End of Session - 12/29/16 1018    Visit Number 4   Date for PT Re-Evaluation 02/10/17   Authorization Type Medicare G codes; KX at visit 15   PT Start Time 0930   PT Stop Time 1030   PT Time Calculation (min) 60 min   Activity Tolerance Patient tolerated treatment well   Behavior During Therapy [facility] for tasks assessed/performed      Past Medical History:  Diagnosis Date  . Anxiety   . Arthritis    ra  . Hyperlipidemia 10/21/2014  . Osteopenia     Past Surgical History:  Procedure Laterality Date  . ABDOMINAL HYSTERECTOMY    . ANTERIOR CERVICAL DECOMP/DISCECTOMY FUSION N/A 03/10/2014   Procedure: Cervical five-six Anterior Cervical Discectomy with  Fusion and Plating ;  Surgeon: Faythe Ghee, MD;  Location: [place] [facility];  Service: Neurosurgery;  Laterality: N/A;  . BACK SURGERY    . BREAST EXCISIONAL BIOPSY    . CARPAL TUNNEL RELEASE    . CESAREAN SECTION     x 3  . EYE SURGERY      There were no vitals filed for this visit.      Subjective Assessment - 12/29/16 0932    Subjective Not alot of difference from dry needling.  I am unable to sit due to buttocks pain. I have pain with sitting on the bike.    Pertinent History RA with knee flare up;  had for 13 years;  2 neck surgeries;  multi abdominal surgeries;  left foot surgery 1 year ago;  osteopenia/osteoporosis   Limitations House hold activities;Sitting;Standing   How long can you sit comfortably? depends on chair 5 minutes   How long can you walk comfortably? < 20 minutes   Diagnostic tests MRI cyst on left   Patient Stated Goals I want to get rid of the pain   Currently in Pain? Yes   Pain Score 7   4/10 low pain   Pain Location Back  low back   Pain Orientation Right;Left;Lower   Pain Descriptors / Indicators Aching  buttocks is sharper   Pain Type Acute pain   Pain Onset More than a month ago   Pain Frequency Intermittent   Aggravating Factors  sitting or standing; driving; walking; no lifting > 5#; uses a cane in the morning b/c pain is so bad; mid afternoon   Pain Relieving Factors lie down on back; take diazepam   Multiple Pain Sites No            [facility] PT Assessment - 12/29/16 0001      Assessment   Medical Diagnosis lumbar radiculopathy     Precautions   Precautions Other (comment)   Precaution Comments RA no lift > 5#                     [facility] Adult PT Treatment/Exercise - 12/29/16 0001      Lumbar Exercises: Aerobic   Stationary Bike Bike: lvl 1, 2 min   stopped due to increase buttocks pain     Lumbar Exercises: Supine   Other Supine Lumbar Exercises attempted feet on ball and bringing knees to  ches 20x; tried bringing knees side to side but increased pain     Modalities   Modalities Electrical Stimulation;Moist Heat     Moist Heat Therapy   Number Minutes Moist Heat 20 Minutes   Moist Heat Location Lumbar Spine  buttocks     Electrical Stimulation   Electrical Stimulation Location B buttocks    Electrical Stimulation Action IFC   Electrical Stimulation Parameters to patient tolerance; 20 min   Electrical Stimulation Goals Pain     Manual Therapy   Manual Therapy Soft tissue mobilization   Soft tissue mobilization B glute complex/piriformis           Trigger Point Dry Needling - 12/29/16 0940    Consent Given? Yes   Muscles Treated Lower Body Gluteus minimus;Gluteus maximus;Piriformis   Gluteus Maximus Response Twitch response elicited;Palpable increased muscle length   Gluteus Minimus Response Twitch response elicited;Palpable increased muscle length   Piriformis Response Twitch  response elicited;Palpable increased muscle length              PT Education - 12/29/16 1014    Education provided Yes   Education Details body mechanics   Person(s) Educated Patient   Methods Explanation   Comprehension Verbalized understanding          PT Short Term Goals - 12/29/16 1023      PT SHORT TERM GOAL #1   Title The patient will report a good understanding of initial self management techniques and HEP   01/13/17   Time 4   Period Weeks   Status On-going     PT SHORT TERM GOAL #2   Title The patient will report a 25% improvement in back and left LE symptoms with sitting, standing and in the morning   Time 4   Period Weeks   Status On-going     PT SHORT TERM GOAL #3   Title Improved HS soft tissue length to 80 degrees bilaterally needed for improved with household chores and caring for her dogs   Time 4   Period Weeks   Status On-going           PT Long Term Goals - 12/25/16 1023      PT LONG TERM GOAL #1   Title The patient will be independent in safe self progression of HEP for further gains in ROM, strength and function     Time 8   Period Weeks   Status On-going     PT LONG TERM GOAL #2   Title The patient will report a 60% reduction in back and left LE pain with sitting, standing, driving and in the mornings   Time 8   Period Weeks   Status On-going     PT LONG TERM GOAL #3   Title The patient will have improved lumbo-pelvic core strength to 4/5 needed for standing/walking for > 20 minutes   Time 8   Period Weeks   Status On-going     PT LONG TERM GOAL #4   Title Left hip strength grossly 4/5 needed for standing and walking longer periods of time with minimal discomfort   Time 8   Period Weeks   Status On-going     PT LONG TERM GOAL #5   Title FOTO functional outcome score improved from 49% limitation to 32% indicating improved function with less pain   Time 8   Period Weeks   Status On-going  Plan -  12/29/16 1018    Clinical Impression Statement Patient has decreased pain for several days after dry needling.  Patient is not able to do the bike due to buttocks pain.  Patient has difficulty with stretches due to pain.  Patient had her husband observe her today so he will understand her pain and she is not able to do alot.  Patient has not met goals due to pain.  Patient will benefit from skilled therapy improve function while reducing pain.    Rehab Potential Good   Clinical Impairments Affecting Rehab Potential none   PT Frequency 2x / week   PT Duration 8 weeks   PT Treatment/Interventions ADLs/Self Care Home Management;Electrical Stimulation;Cryotherapy;Ultrasound;Moist Heat;Patient/family education;Neuromuscular re-education;Therapeutic exercise;Therapeutic activities;Manual techniques;Taping;Dry needling   PT Next Visit Plan continue to dry needle one time per week if benefit; decompression series for osteoporosis; gentle stretches and core    PT Home Exercise Plan progress as needed   Recommended Other Services MD signed initial summary   Consulted and Agree with Plan of Care Patient      Patient will benefit from skilled therapeutic intervention in order to improve the following deficits and impairments:  Decreased range of motion, Decreased strength, Increased fascial restricitons, Pain  Visit Diagnosis: Acute bilateral low back pain with left-sided sciatica  Muscle weakness (generalized)     Problem List Patient Active Problem List   Diagnosis Date Noted  . Hyperlipidemia 10/21/2014  . Chest pain with moderate risk for cardiac etiology 10/20/2014  . Cervical stenosis of spinal canal 03/10/2014  . TOBACCO DEPENDENCE 07/02/2006  . RESTLESS LEGS SYNDROME 07/02/2006  . MITRAL VALVE PROLAPSE 07/02/2006  . MENOPAUSAL SYNDROME 07/02/2006  . DJD, UNSPECIFIED 07/02/2006  . DISC WITH RADICULOPATHY 07/02/2006    Earlie Counts, PT 12/29/16 10:25 AM   [facility] [address], [place], [place], [address] Phone: [phone]   Fax:  [phone]  Name: Marilyn Perez MRN: [id number] Date of Birth: February 15, 1951

## 2016-12-31 ENCOUNTER — Ambulatory Visit: Payer: Medicare Other

## 2016-12-31 DIAGNOSIS — M6281 Muscle weakness (generalized): Secondary | ICD-10-CM

## 2016-12-31 DIAGNOSIS — M5442 Lumbago with sciatica, left side: Secondary | ICD-10-CM | POA: Diagnosis not present

## 2016-12-31 NOTE — Therapy (Signed)
[facility] [address], [place], [place], [address] Phone: [phone]   Fax:  [phone]  Physical Therapy Treatment  Patient Details  Name: Marilyn Perez MRN: [id number] Date of Birth: 09/24/1950 Referring Provider: Dr. Kathyrn Sheriff  Encounter Date: 12/31/2016      PT End of Session - 12/31/16 1024    Visit Number 5   Date for PT Re-Evaluation 02/10/17   Authorization Type Medicare G codes; KX at visit 15   PT Start Time 1017   PT Stop Time 1100   PT Time Calculation (min) 43 min   Activity Tolerance Patient tolerated treatment well   Behavior During Therapy [facility] for tasks assessed/performed      Past Medical History:  Diagnosis Date  . Anxiety   . Arthritis    ra  . Hyperlipidemia 10/21/2014  . Osteopenia     Past Surgical History:  Procedure Laterality Date  . ABDOMINAL HYSTERECTOMY    . ANTERIOR CERVICAL DECOMP/DISCECTOMY FUSION N/A 03/10/2014   Procedure: Cervical five-six Anterior Cervical Discectomy with  Fusion and Plating ;  Surgeon: Faythe Ghee, MD;  Location: [place] [facility];  Service: Neurosurgery;  Laterality: N/A;  . BACK SURGERY    . BREAST EXCISIONAL BIOPSY    . CARPAL TUNNEL RELEASE    . CESAREAN SECTION     x 3  . EYE SURGERY      There were no vitals filed for this visit.      Subjective Assessment - 12/31/16 1020    Subjective Pt. noting she has had worsened LBP yesterday.  Called MD and scheduled back injection 9.12.18.  Some improvement in buttocks pain L improved > R.     Patient Stated Goals I want to get rid of the pain   Currently in Pain? Yes   Pain Score 7    Pain Location Back   Pain Orientation Right;Left;Lower   Pain Descriptors / Indicators Aching   Pain Type Acute pain   Pain Onset More than a month ago   Pain Frequency Intermittent   Aggravating Factors  sitting or standingl; driving    Pain Relieving Factors lie on back    Multiple Pain Sites No                          [facility] Adult PT Treatment/Exercise - 12/31/16 1027      Lumbar Exercises: Stretches   Piriformis Stretch 30 seconds;4 reps   Piriformis Stretch Limitations bil ;KTOS, Figure-4     Lumbar Exercises: Aerobic   Stationary Bike NuStep: lvl 8 min   With discussion of community resources for LE exercise    UBE (Upper Arm Bike) lvl 1, 2 min forward/2 min back     Lumbar Exercises: Supine   Bridge 15 reps;3 seconds   Bridge Limitations mild increased in R buttocks pain following this thus terminated      Manual Therapy   Manual Therapy Soft tissue mobilization;Passive ROM   Manual therapy comments L sidelying with R LE elevated on bolster    Soft tissue mobilization R glute complex/piriformis   Myofascial Release R TPR to piriformis area; pt. with improved tolerance for this today   Passive ROM R HS, KTOS, and mod piriformis stretch with therapist x 30 sec                   PT Short Term Goals - 12/29/16 1023  PT SHORT TERM GOAL #1   Title The patient will report a good understanding of initial self management techniques and HEP   01/13/17   Time 4   Period Weeks   Status On-going     PT SHORT TERM GOAL #2   Title The patient will report a 25% improvement in back and left LE symptoms with sitting, standing and in the morning   Time 4   Period Weeks   Status On-going     PT SHORT TERM GOAL #3   Title Improved HS soft tissue length to 80 degrees bilaterally needed for improved with household chores and caring for her dogs   Time 4   Period Weeks   Status On-going           PT Long Term Goals - 12/25/16 1023      PT LONG TERM GOAL #1   Title The patient will be independent in safe self progression of HEP for further gains in ROM, strength and function     Time 8   Period Weeks   Status On-going     PT LONG TERM GOAL #2   Title The patient will report a 60% reduction in back and left LE pain with sitting, standing,  driving and in the mornings   Time 8   Period Weeks   Status On-going     PT LONG TERM GOAL #3   Title The patient will have improved lumbo-pelvic core strength to 4/5 needed for standing/walking for > 20 minutes   Time 8   Period Weeks   Status On-going     PT LONG TERM GOAL #4   Title Left hip strength grossly 4/5 needed for standing and walking longer periods of time with minimal discomfort   Time 8   Period Weeks   Status On-going     PT LONG TERM GOAL #5   Title FOTO functional outcome score improved from 49% limitation to 32% indicating improved function with less pain   Time 8   Period Weeks   Status On-going               Plan - 12/31/16 1026    Clinical Impression Statement Pt. noting some improvement in L buttocks pain today following DN and would be open to trying this again in future visits.  Pt. noting increased LBP over this past week and has scheduled injection to lower back for early next month.  Treatment focusing on gentle stretching, lumbopelivc strengthening per pt. tolerance.  STM/TPR to R buttocks today over area of most tenderness with some relief noted.  Pt. still with palpable TP's in R buttocks and would benefit from further manual work in this area.  May plan to continue DN in upcoming visits.   PT Treatment/Interventions ADLs/Self Care Home Management;Electrical Stimulation;Cryotherapy;Ultrasound;Moist Heat;Patient/family education;Neuromuscular re-education;Therapeutic exercise;Therapeutic activities;Manual techniques;Taping;Dry needling   PT Next Visit Plan continue to dry needle one time per week if benefit; decompression series for osteoporosis; gentle stretches and core       Patient will benefit from skilled therapeutic intervention in order to improve the following deficits and impairments:  Decreased range of motion, Decreased strength, Increased fascial restricitons, Pain  Visit Diagnosis: Acute bilateral low back pain with left-sided  sciatica  Muscle weakness (generalized)     Problem List Patient Active Problem List   Diagnosis Date Noted  . Hyperlipidemia 10/21/2014  . Chest pain with moderate risk for cardiac etiology 10/20/2014  . Cervical stenosis of  spinal canal 03/10/2014  . TOBACCO DEPENDENCE 07/02/2006  . RESTLESS LEGS SYNDROME 07/02/2006  . MITRAL VALVE PROLAPSE 07/02/2006  . MENOPAUSAL SYNDROME 07/02/2006  . DJD, UNSPECIFIED 07/02/2006  . DISC WITH RADICULOPATHY 07/02/2006    Bess Harvest, PTA 12/31/16 3:27 PM  [facility] [address], [place], [place], [address] Phone: [phone]   Fax:  [phone]  Name: TAMITHA NORELL MRN: [id number] Date of Birth: 07-30-1950

## 2017-01-06 ENCOUNTER — Ambulatory Visit: Payer: Medicare Other | Attending: Neurosurgery

## 2017-01-06 DIAGNOSIS — M5442 Lumbago with sciatica, left side: Secondary | ICD-10-CM

## 2017-01-06 DIAGNOSIS — M6281 Muscle weakness (generalized): Secondary | ICD-10-CM | POA: Diagnosis not present

## 2017-01-06 NOTE — Therapy (Signed)
[facility] [address], [place], [place], [address] Phone: [phone]   Fax:  [phone]  Physical Therapy Treatment  Patient Details  Name: Marilyn Perez MRN: [id number] Date of Birth: Apr 24, 1951 Referring Provider: Dr. Kathyrn Sheriff  Encounter Date: 01/06/2017      PT End of Session - 01/06/17 1055    Visit Number 6   Number of Visits 10   Date for PT Re-Evaluation 02/10/17   Authorization Type Medicare G codes; KX at visit 15   PT Start Time 1015   PT Stop Time 1110   PT Time Calculation (min) 55 min   Activity Tolerance Patient tolerated treatment well   Behavior During Therapy [facility] for tasks assessed/performed      Past Medical History:  Diagnosis Date  . Anxiety   . Arthritis    ra  . Hyperlipidemia 10/21/2014  . Osteopenia     Past Surgical History:  Procedure Laterality Date  . ABDOMINAL HYSTERECTOMY    . ANTERIOR CERVICAL DECOMP/DISCECTOMY FUSION N/A 03/10/2014   Procedure: Cervical five-six Anterior Cervical Discectomy with  Fusion and Plating ;  Surgeon: Faythe Ghee, MD;  Location: [place] [facility];  Service: Neurosurgery;  Laterality: N/A;  . BACK SURGERY    . BREAST EXCISIONAL BIOPSY    . CARPAL TUNNEL RELEASE    . CESAREAN SECTION     x 3  . EYE SURGERY      There were no vitals filed for this visit.      Subjective Assessment - 01/06/17 1016    Subjective Dry needling is helping.  I am still having problems in my Lt buttock. Pt reports 85% overall improvement.     Pertinent History RA with knee flare up;  had for 13 years;  2 neck surgeries;  multi abdominal surgeries;  left foot surgery 1 year ago;  osteopenia/osteoporosis   Pain Score 4    Pain Location Back   Pain Orientation Right;Left;Lower   Pain Descriptors / Indicators Aching   Pain Type Acute pain   Pain Onset More than a month ago   Pain Frequency Intermittent   Aggravating Factors  sitting, driving   Pain  Relieving Factors change of position                         [facility] Adult PT Treatment/Exercise - 01/06/17 0001      Modalities   Modalities Moist Heat     Moist Heat Therapy   Number Minutes Moist Heat 15 Minutes   Moist Heat Location Lumbar Spine     Manual Therapy   Manual Therapy Soft tissue mobilization   Manual therapy comments soft tissue elongation and trigger point release to bil gluteals with gentle pressure          Trigger Point Dry Needling - 01/06/17 1020    Consent Given? Yes   Muscles Treated Lower Body Gluteus minimus;Gluteus maximus;Piriformis   Gluteus Maximus Response Twitch response elicited;Palpable increased muscle length   Gluteus Minimus Response Twitch response elicited;Palpable increased muscle length   Piriformis Response Twitch response elicited;Palpable increased muscle length                PT Short Term Goals - 01/06/17 1019      PT SHORT TERM GOAL #1   Title The patient will report a good understanding of initial self management techniques and HEP   01/13/17   Status Achieved  PT SHORT TERM GOAL #2   Title The patient will report a 25% improvement in back and left LE symptoms with sitting, standing and in the morning   Status Achieved           PT Long Term Goals - 01/06/17 1019      PT LONG TERM GOAL #1   Title The patient will be independent in safe self progression of HEP for further gains in ROM, strength and function     Time 8   Period Weeks   Status On-going     PT LONG TERM GOAL #2   Title The patient will report a 60% reduction in back and left LE pain with sitting, standing, driving and in the mornings   Baseline 85%   Time 8   Period Weeks   Status On-going               Plan - 01/06/17 1020    Clinical Impression Statement Pt reports 85% overall improvment in symptoms since the start of care. Pt with trigger points in bil gluteals Rt>Lt and is hypersensitive to light touch on  the Rt.  Pt demonstrated improved tissue mobility and reduced pain after dry needling today.  Pt is limited in sitting/driving long periods of time.  Pt demonstrates gluteal weakness and has difficulty with clam shells.  Pt will continue to benefit from skilled PT for core and hip strength, dry needling and manual therapy as needed.     PT Frequency 2x / week   PT Duration 8 weeks   PT Next Visit Plan decompression series for osteoporosis; gentle stretches and core.  Assess response to dry needling   Consulted and Agree with Plan of Care Patient      Patient will benefit from skilled therapeutic intervention in order to improve the following deficits and impairments:  Decreased range of motion, Decreased strength, Increased fascial restricitons, Pain  Visit Diagnosis: Acute bilateral low back pain with left-sided sciatica  Muscle weakness (generalized)     Problem List Patient Active Problem List   Diagnosis Date Noted  . Hyperlipidemia 10/21/2014  . Chest pain with moderate risk for cardiac etiology 10/20/2014  . Cervical stenosis of spinal canal 03/10/2014  . TOBACCO DEPENDENCE 07/02/2006  . RESTLESS LEGS SYNDROME 07/02/2006  . MITRAL VALVE PROLAPSE 07/02/2006  . MENOPAUSAL SYNDROME 07/02/2006  . DJD, UNSPECIFIED 07/02/2006  . DISC WITH RADICULOPATHY 07/02/2006   Sigurd Sos, PT 01/06/17 10:57 AM  [facility] [address], [place], [place], [address] Phone: [phone]   Fax:  [phone]  Name: Marilyn Perez MRN: [id number] Date of Birth: 1951-03-17

## 2017-01-08 ENCOUNTER — Encounter: Payer: Self-pay | Admitting: Physical Therapy

## 2017-01-08 ENCOUNTER — Ambulatory Visit: Payer: Medicare Other | Admitting: Physical Therapy

## 2017-01-08 DIAGNOSIS — M6281 Muscle weakness (generalized): Secondary | ICD-10-CM | POA: Diagnosis not present

## 2017-01-08 DIAGNOSIS — M5442 Lumbago with sciatica, left side: Secondary | ICD-10-CM

## 2017-01-08 DIAGNOSIS — N898 Other specified noninflammatory disorders of vagina: Secondary | ICD-10-CM | POA: Diagnosis not present

## 2017-01-08 NOTE — Therapy (Signed)
[facility] [address] [place], [place], [address] Phone: [phone]   Fax:  [phone]  Physical Therapy Treatment  Patient Details  Name: Marilyn Perez MRN: [id number] Date of Birth: May 28, 1950 Referring Provider: Dr. Kathyrn Sheriff  Encounter Date: 01/08/2017      PT End of Session - 01/08/17 1058    Visit Number 7   Number of Visits 10   Date for PT Re-Evaluation 02/10/17   Authorization Type Medicare G codes; KX at visit 15   PT Start Time 1015   PT Stop Time 1058   PT Time Calculation (min) 43 min   Activity Tolerance Patient tolerated treatment well   Behavior During Therapy [facility] for tasks assessed/performed      Past Medical History:  Diagnosis Date  . Anxiety   . Arthritis    ra  . Hyperlipidemia 10/21/2014  . Osteopenia     Past Surgical History:  Procedure Laterality Date  . ABDOMINAL HYSTERECTOMY    . ANTERIOR CERVICAL DECOMP/DISCECTOMY FUSION N/A 03/10/2014   Procedure: Cervical five-six Anterior Cervical Discectomy with  Fusion and Plating ;  Surgeon: Faythe Ghee, MD;  Location: [place] [facility];  Service: Neurosurgery;  Laterality: N/A;  . BACK SURGERY    . BREAST EXCISIONAL BIOPSY    . CARPAL TUNNEL RELEASE    . CESAREAN SECTION     x 3  . EYE SURGERY      There were no vitals filed for this visit.      Subjective Assessment - 01/08/17 1018    Subjective We did alot of needling last visit and I was sore.  I feel better today.    Pertinent History RA with knee flare up;  had for 13 years;  2 neck surgeries;  multi abdominal surgeries;  left foot surgery 1 year ago;  osteopenia/osteoporosis   Limitations House hold activities;Sitting;Standing   How long can you sit comfortably? depends on chair 5 minutes   How long can you walk comfortably? < 20 minutes   Diagnostic tests MRI cyst on left   Currently in Pain? Yes   Pain Score 2    Pain Location Buttocks   Pain Orientation  Right   Pain Descriptors / Indicators Aching   Pain Type Acute pain   Pain Onset More than a month ago   Pain Frequency Intermittent   Aggravating Factors  sitting, driving   Pain Relieving Factors change of position   Multiple Pain Sites No                         [facility] Adult PT Treatment/Exercise - 01/08/17 0001      Manual Therapy   Manual Therapy Soft tissue mobilization   Soft tissue mobilization right gluteal, pirifromis, gluteus medius                PT Education - 01/08/17 1057    Education provided Yes   Education Details decompression exercises series; transverse abdominus exercise   Person(s) Educated Patient   Methods Explanation;Demonstration;Verbal cues;Handout   Comprehension Returned demonstration;Verbalized understanding          PT Short Term Goals - 01/06/17 1019      PT SHORT TERM GOAL #1   Title The patient will report a good understanding of initial self management techniques and HEP   01/13/17   Status Achieved     PT SHORT TERM GOAL #2   Title The  patient will report a 25% improvement in back and left LE symptoms with sitting, standing and in the morning   Status Achieved           PT Long Term Goals - 01/06/17 1019      PT LONG TERM GOAL #1   Title The patient will be independent in safe self progression of HEP for further gains in ROM, strength and function     Time 8   Period Weeks   Status On-going     PT LONG TERM GOAL #2   Title The patient will report a 60% reduction in back and left LE pain with sitting, standing, driving and in the mornings   Baseline 85%   Time 8   Period Weeks   Status On-going               Plan - 01/08/17 1058    Clinical Impression Statement Patient has trigger points in the right piriformis and gluteus medius.  Patient able to contract core correctly with exercises.  Patient had decreased pain after therapy.  Patient will benefit from skilled therapy to increase core  and hip strength with decreasing pain.    Rehab Potential Good   Clinical Impairments Affecting Rehab Potential none   PT Frequency 2x / week   PT Duration 8 weeks   PT Treatment/Interventions ADLs/Self Care Home Management;Electrical Stimulation;Cryotherapy;Ultrasound;Moist Heat;Patient/family education;Neuromuscular re-education;Therapeutic exercise;Therapeutic activities;Manual techniques;Taping;Dry needling   PT Next Visit Plan Dry needling; soft tissue work, right hip strength   PT Home Exercise Plan progress as needed   Consulted and Agree with Plan of Care Patient      Patient will benefit from skilled therapeutic intervention in order to improve the following deficits and impairments:  Decreased range of motion, Decreased strength, Increased fascial restricitons, Pain  Visit Diagnosis: Acute bilateral low back pain with left-sided sciatica  Muscle weakness (generalized)     Problem List Patient Active Problem List   Diagnosis Date Noted  . Hyperlipidemia 10/21/2014  . Chest pain with moderate risk for cardiac etiology 10/20/2014  . Cervical stenosis of spinal canal 03/10/2014  . TOBACCO DEPENDENCE 07/02/2006  . RESTLESS LEGS SYNDROME 07/02/2006  . MITRAL VALVE PROLAPSE 07/02/2006  . MENOPAUSAL SYNDROME 07/02/2006  . DJD, UNSPECIFIED 07/02/2006  . DISC WITH RADICULOPATHY 07/02/2006    Earlie Counts, PT 01/08/17 11:01 AM   [facility] [address] [place], [place], [address] Phone: [phone]   Fax:  [phone]  Name: Marilyn Perez MRN: [id number] Date of Birth: 10-30-1950

## 2017-01-08 NOTE — Patient Instructions (Addendum)
Head Press With [place] chin SLIGHTLY toward chest, keep mouth closed. Feel weight on back of head. Increase weight by pressing head down. Hold _5__ seconds. Relax. Repeat _5__ times.   Copyright  VHI. All rights reserved.  Shoulder Press    Press both shoulders down. Hold _5__ seconds. Repeat _5__ times.  seconds Repeat ___ times.    Copyright  VHI. All rights reserved.  Leg Lengthener: Full    Straighten one leg. Pull toes AND forefoot toward knee, extend heel. Lengthen leg by pulling pelvis away from ribs. Hold _5__ seconds. Relax. Repeat 1 time. Re-bend knee. Do other leg. Each leg _5__ times.    Copyright  VHI. All rights reserved.  Abdominal Bracing With Pelvic Floor (Hook-Lying)    With neutral spine, tighten pelvic floor and abdominals.hold 5 seconds Repeat _5__ times. Do _1__ times a day.   Copyright  VHI. All rights reserved.    Bracing With Knee Fallout (Hook-Lying)    With neutral spine, tighten pelvic floor and abdominals and hold. Alternating legs, drop knee out to side. Keep opposite hip still. Repeat ___ times. Do ___ times a day.   Copyright  VHI. All rights reserved.  Bracing With Leg March (Hook-Lying)    With neutral spine, tighten pelvic floor and abdominals and hold. Alternating legs, lift foot _6__ inches and return to floor. Repeat _10__ times. Do _1__ times a day.   Copyright  VHI. All rights reserved.  Bracing With Arms / Legs (Hook-Lying)    With neutral spine, tighten pelvic floor and abdominals and hold. Raise arm and opposite leg, then return. Repeat wtih other limbs. Repeat _10__ times. Do _1__ times a day.   Copyright  VHI. All rights reserved.  [facility] [address], [place], [place] [address] Phone # [phone] Fax [phone]

## 2017-01-12 ENCOUNTER — Ambulatory Visit: Payer: Medicare Other

## 2017-01-12 DIAGNOSIS — M6281 Muscle weakness (generalized): Secondary | ICD-10-CM

## 2017-01-12 DIAGNOSIS — M5442 Lumbago with sciatica, left side: Secondary | ICD-10-CM | POA: Diagnosis not present

## 2017-01-12 NOTE — Therapy (Addendum)
[facility] [address], [place], [place], [address] Phone: [phone]   Fax:  [phone]  Physical Therapy Treatment  Patient Details  Name: Marilyn Perez MRN: [id number] Date of Birth: 05/06/1950 Referring Provider: Dr. Kathyrn Sheriff  Encounter Date: 01/12/2017      PT End of Session - 01/12/17 1057    Visit Number 8   Number of Visits 10   Date for PT Re-Evaluation 02/10/17   Authorization Type Medicare G codes; KX at visit 15   PT Start Time 1016   PT Stop Time 1113   PT Time Calculation (min) 57 min   Activity Tolerance Patient tolerated treatment well   Behavior During Therapy [facility] for tasks assessed/performed      Past Medical History:  Diagnosis Date  . Anxiety   . Arthritis    ra  . Hyperlipidemia 10/21/2014  . Osteopenia     Past Surgical History:  Procedure Laterality Date  . ABDOMINAL HYSTERECTOMY    . ANTERIOR CERVICAL DECOMP/DISCECTOMY FUSION N/A 03/10/2014   Procedure: Cervical five-six Anterior Cervical Discectomy with  Fusion and Plating ;  Surgeon: Faythe Ghee, MD;  Location: [place] [facility];  Service: Neurosurgery;  Laterality: N/A;  . BACK SURGERY    . BREAST EXCISIONAL BIOPSY    . CARPAL TUNNEL RELEASE    . CESAREAN SECTION     x 3  . EYE SURGERY      There were no vitals filed for this visit.      Subjective Assessment - 01/12/17 1017    Subjective Yesterday I had a really bad day.  I sat in church and things got stiff.  I get an injection on Wednesday.     Pertinent History RA with knee flare up;  had for 13 years;  2 neck surgeries;  multi abdominal surgeries;  left foot surgery 1 year ago;  osteopenia/osteoporosis   Currently in Pain? Yes   Pain Score 2    Pain Location Buttocks   Pain Orientation Right   Pain Descriptors / Indicators Aching   Pain Type Acute pain   Pain Onset More than a month ago   Pain Frequency Intermittent   Aggravating Factors  sitting,  driving   Pain Relieving Factors change of position                         [facility] Adult PT Treatment/Exercise - 01/12/17 0001      Lumbar Exercises: Supine   Other Supine Lumbar Exercises decompression exercises     Modalities   Modalities Moist Heat     Moist Heat Therapy   Number Minutes Moist Heat 15 Minutes   Moist Heat Location Lumbar Spine     Manual Therapy   Manual Therapy Soft tissue mobilization   Manual therapy comments soft tissue elongation and trigger point release to bil gluteals with gentle pressure         Trigger Point Dry Needling -    Consent Given? Yes   Muscles Treated Lower Body Gluteus minimus;Gluteus maximus;Piriformis   Gluteus Maximus Response Twitch response elicited;Palpable increased muscle length   Gluteus Minimus Response Twitch response elicited;Palpable increased muscle length   Piriformis Response Twitch response elicited;Palpable increased muscle length                 PT Short Term Goals - 01/12/17 1021      PT SHORT TERM GOAL #1   Title  The patient will report a good understanding of initial self management techniques and HEP   01/13/17   Status Achieved           PT Long Term Goals - 01/12/17 1022      PT LONG TERM GOAL #1   Title The patient will be independent in safe self progression of HEP for further gains in ROM, strength and function     Time 8   Period Weeks   Status On-going     PT LONG TERM GOAL #2   Title The patient will report a 60% reduction in back and left LE pain with sitting, standing, driving and in the mornings   Baseline 87%    Time 8   Period Weeks   Status On-going               Plan - 01/12/17 1027    Clinical Impression Statement Pt reports 87% overall pain reduction in the gluteals since the start of care.  Pt reports continued lumbar pain and will get an injection this week.  Pt with trigger points in the Rt gluteals >Lt and deomonstrated improved  tissue mobility and reduced pain after dry needling and manual therapy today.  Pt will continue to benefit from skilled PT for strength, flexibility and manual/dry needling.     Rehab Potential Good   PT Frequency 2x / week   PT Duration 8 weeks   PT Treatment/Interventions ADLs/Self Care Home Management;Electrical Stimulation;Cryotherapy;Ultrasound;Moist Heat;Patient/family education;Neuromuscular re-education;Therapeutic exercise;Therapeutic activities;Manual techniques;Taping;Dry needling   PT Next Visit Plan assess response to dry needling, hip strength, core strength, measure hamstring length   Consulted and Agree with Plan of Care Patient      Patient will benefit from skilled therapeutic intervention in order to improve the following deficits and impairments:  Decreased range of motion, Decreased strength, Increased fascial restricitons, Pain  Visit Diagnosis: Acute bilateral low back pain with left-sided sciatica  Muscle weakness (generalized)     Problem List Patient Active Problem List   Diagnosis Date Noted  . Hyperlipidemia 10/21/2014  . Chest pain with moderate risk for cardiac etiology 10/20/2014  . Cervical stenosis of spinal canal 03/10/2014  . TOBACCO DEPENDENCE 07/02/2006  . RESTLESS LEGS SYNDROME 07/02/2006  . MITRAL VALVE PROLAPSE 07/02/2006  . MENOPAUSAL SYNDROME 07/02/2006  . DJD, UNSPECIFIED 07/02/2006  . DISC WITH RADICULOPATHY 07/02/2006    Sigurd Sos, PT 01/12/17 10:58 AM  [facility] [address], [place], [place], [address] Phone: [phone]   Fax:  [phone]  Name: Marilyn Perez MRN: [id number] Date of Birth: May 12, 1950

## 2017-01-14 DIAGNOSIS — M5416 Radiculopathy, lumbar region: Secondary | ICD-10-CM | POA: Diagnosis not present

## 2017-01-16 DIAGNOSIS — N9089 Other specified noninflammatory disorders of vulva and perineum: Secondary | ICD-10-CM | POA: Diagnosis not present

## 2017-01-16 DIAGNOSIS — N898 Other specified noninflammatory disorders of vagina: Secondary | ICD-10-CM | POA: Diagnosis not present

## 2017-01-16 DIAGNOSIS — N952 Postmenopausal atrophic vaginitis: Secondary | ICD-10-CM | POA: Diagnosis not present

## 2017-01-16 DIAGNOSIS — N909 Noninflammatory disorder of vulva and perineum, unspecified: Secondary | ICD-10-CM | POA: Diagnosis not present

## 2017-01-16 DIAGNOSIS — A630 Anogenital (venereal) warts: Secondary | ICD-10-CM | POA: Diagnosis not present

## 2017-01-19 ENCOUNTER — Ambulatory Visit: Payer: Medicare Other

## 2017-01-19 DIAGNOSIS — M5442 Lumbago with sciatica, left side: Secondary | ICD-10-CM

## 2017-01-19 DIAGNOSIS — E538 Deficiency of other specified B group vitamins: Secondary | ICD-10-CM | POA: Diagnosis not present

## 2017-01-19 DIAGNOSIS — M6281 Muscle weakness (generalized): Secondary | ICD-10-CM | POA: Diagnosis not present

## 2017-01-19 NOTE — Therapy (Signed)
[facility] [address] [place], [place], [address] Phone: [phone]   Fax:  [phone]  Physical Therapy Treatment  Patient Details  Name: Marilyn Perez MRN: [id number] Date of Birth: 1951/01/21 Referring Provider: Dr. Kathyrn Sheriff  Encounter Date: 01/19/2017      PT End of Session - 01/19/17 0847    Visit Number 9   Number of Visits 10   Date for PT Re-Evaluation 02/10/17   Authorization Type Medicare G codes; KX at visit 15   PT Start Time 0801   PT Stop Time 0842   PT Time Calculation (min) 41 min   Activity Tolerance Patient tolerated treatment well   Behavior During Therapy [facility] for tasks assessed/performed      Past Medical History:  Diagnosis Date  . Anxiety   . Arthritis    ra  . Hyperlipidemia 10/21/2014  . Osteopenia     Past Surgical History:  Procedure Laterality Date  . ABDOMINAL HYSTERECTOMY    . ANTERIOR CERVICAL DECOMP/DISCECTOMY FUSION N/A 03/10/2014   Procedure: Cervical five-six Anterior Cervical Discectomy with  Fusion and Plating ;  Surgeon: Faythe Ghee, MD;  Location: [place] [facility];  Service: Neurosurgery;  Laterality: N/A;  . BACK SURGERY    . BREAST EXCISIONAL BIOPSY    . CARPAL TUNNEL RELEASE    . CESAREAN SECTION     x 3  . EYE SURGERY      There were no vitals filed for this visit.      Subjective Assessment - 01/19/17 0801    Subjective Injection 01/14/17.  It has helped the hips but no change in the low back.     Pertinent History RA with knee flare up;  had for 13 years;  2 neck surgeries;  multi abdominal surgeries;  left foot surgery 1 year ago;  osteopenia/osteoporosis   Currently in Pain? Yes   Pain Score 2    Pain Location Back   Pain Orientation Right   Pain Descriptors / Indicators Aching   Pain Type Acute pain   Pain Onset More than a month ago   Pain Frequency Intermittent   Aggravating Factors  sitting, driving   Pain Relieving Factors change  of position            [facility] PT Assessment - 01/19/17 0001      Observation/Other Assessments   Focus on Therapeutic Outcomes (FOTO)  58%      Flexibility   Hamstrings 75 degrees bil                     [facility] Adult PT Treatment/Exercise - 01/19/17 0001      Exercises   Exercises Knee/Hip     Lumbar Exercises: Stretches   Active Hamstring Stretch 3 reps;20 seconds     Knee/Hip Exercises: Aerobic   Nustep Level 2x 7 minutes  PT present to discuss progress     Moist Heat Therapy   Number Minutes Moist Heat --   Moist Heat Location --     Manual Therapy   Manual Therapy Soft tissue mobilization   Manual therapy comments soft tissue elongation and trigger point release to bil lumbar paraspinals           Trigger Point Dry Needling - 01/19/17 0811    Consent Given? Yes   Muscles Treated Lower Body Gluteus maximus;Gluteus minimus  lumbar multifidi  PT Short Term Goals - 01/12/17 1021      PT SHORT TERM GOAL #1   Title The patient will report a good understanding of initial self management techniques and HEP   01/13/17   Status Achieved           PT Long Term Goals - 01/19/17 0808      PT LONG TERM GOAL #1   Title The patient will be independent in safe self progression of HEP for further gains in ROM, strength and function     Time 8   Period Weeks   Status On-going     PT LONG TERM GOAL #2   Title The patient will report a 60% reduction in back and left LE pain with sitting, standing, driving and in the mornings   Baseline 87%    Status Achieved     PT LONG TERM GOAL #5   Title FOTO functional outcome score improved from 49% limitation to 32% indicating improved function with less pain   Baseline 58% limitation   Time 8               Plan - 01/19/17 0813    Clinical Impression Statement Pt reports 87% overall pain reduction in the gluteals since the start of care.  Pt had injection in the lumbar spine  and this has helped her buttock pain and not impact on lumbar pain.  Pt with tension and trigger points in bil lumbar paraspinals and demonstrated reduced pain and tissue mobility after dry needling.  Pt declined heat due to time constraints.  Pt will continue to benefit from skilled PT for LE strength, flexibility and manual for pain management.     PT Frequency 2x / week   PT Duration 8 weeks   PT Treatment/Interventions ADLs/Self Care Home Management;Electrical Stimulation;Cryotherapy;Ultrasound;Moist Heat;Patient/family education;Neuromuscular re-education;Therapeutic exercise;Therapeutic activities;Manual techniques;Taping;Dry needling   PT Next Visit Plan assess response to dry needling, hip strength, core strength, G-codes next   Consulted and Agree with Plan of Care Patient      Patient will benefit from skilled therapeutic intervention in order to improve the following deficits and impairments:  Decreased range of motion, Decreased strength, Increased fascial restricitons, Pain  Visit Diagnosis: Acute bilateral low back pain with left-sided sciatica  Muscle weakness (generalized)     Problem List Patient Active Problem List   Diagnosis Date Noted  . Hyperlipidemia 10/21/2014  . Chest pain with moderate risk for cardiac etiology 10/20/2014  . Cervical stenosis of spinal canal 03/10/2014  . TOBACCO DEPENDENCE 07/02/2006  . RESTLESS LEGS SYNDROME 07/02/2006  . MITRAL VALVE PROLAPSE 07/02/2006  . MENOPAUSAL SYNDROME 07/02/2006  . DJD, UNSPECIFIED 07/02/2006  . DISC WITH RADICULOPATHY 07/02/2006   Sigurd Sos, PT 01/19/17 8:52 AM  [facility] [address], [place], [place], [address] Phone: [phone]   Fax:  [phone]  Name: Marilyn Perez MRN: [id number] Date of Birth: August 17, 1950

## 2017-01-20 DIAGNOSIS — M0609 Rheumatoid arthritis without rheumatoid factor, multiple sites: Secondary | ICD-10-CM | POA: Diagnosis not present

## 2017-01-21 ENCOUNTER — Ambulatory Visit: Payer: Medicare Other

## 2017-01-21 DIAGNOSIS — M6281 Muscle weakness (generalized): Secondary | ICD-10-CM

## 2017-01-21 DIAGNOSIS — M5442 Lumbago with sciatica, left side: Secondary | ICD-10-CM | POA: Diagnosis not present

## 2017-01-21 NOTE — Therapy (Signed)
[facility] [address] [place], [place], [address] Phone: [phone]   Fax:  [phone]  Physical Therapy Treatment  Patient Details  Name: Marilyn Perez MRN: [id number] Date of Birth: October 21, 1950 Referring Provider: Dr. Kathyrn Sheriff  Encounter Date: 01/21/2017      PT End of Session - 01/21/17 1138    Visit Number 10   Number of Visits 20   Date for PT Re-Evaluation 02/10/17   Authorization Type Medicare G codes; KX at visit 15   PT Start Time 1100   PT Stop Time 1154   PT Time Calculation (min) 54 min   Activity Tolerance Patient tolerated treatment well   Behavior During Therapy [facility] for tasks assessed/performed      Past Medical History:  Diagnosis Date  . Anxiety   . Arthritis    ra  . Hyperlipidemia 10/21/2014  . Osteopenia     Past Surgical History:  Procedure Laterality Date  . ABDOMINAL HYSTERECTOMY    . ANTERIOR CERVICAL DECOMP/DISCECTOMY FUSION N/A 03/10/2014   Procedure: Cervical five-six Anterior Cervical Discectomy with  Fusion and Plating ;  Surgeon: Faythe Ghee, MD;  Location: [place] [facility];  Service: Neurosurgery;  Laterality: N/A;  . BACK SURGERY    . BREAST EXCISIONAL BIOPSY    . CARPAL TUNNEL RELEASE    . CESAREAN SECTION     x 3  . EYE SURGERY      There were no vitals filed for this visit.      Subjective Assessment - 01/21/17 1107    Subjective I hurt all over.   Currently in Pain? Yes   Pain Score 3    Pain Location Back   Pain Orientation Right;Left   Pain Descriptors / Indicators Aching   Pain Type Acute pain   Pain Onset More than a month ago   Pain Frequency Intermittent   Aggravating Factors  sitting, driving   Pain Relieving Factors change of position            [facility] PT Assessment - 01/21/17 0001      Observation/Other Assessments   Focus on Therapeutic Outcomes (FOTO)  58%                      [facility] Adult PT Treatment/Exercise  - 01/21/17 0001      Lumbar Exercises: Stretches   Active Hamstring Stretch 3 reps;20 seconds   Single Knee to Chest Stretch 3 reps;30 seconds   Lower Trunk Rotation 3 reps;20 seconds   Piriformis Stretch 30 seconds;4 reps     Lumbar Exercises: Sidelying   Clam 20 reps     Knee/Hip Exercises: Aerobic   Nustep Level 2x 10 minutes  PT present to discuss progress     Moist Heat Therapy   Number Minutes Moist Heat 15 Minutes   Moist Heat Location Lumbar Spine     Electrical Stimulation   Electrical Stimulation Location low back   Electrical Stimulation Action IFC   Electrical Stimulation Parameters 15 minutes   Electrical Stimulation Goals Pain                  PT Short Term Goals - 01/12/17 1021      PT SHORT TERM GOAL #1   Title The patient will report a good understanding of initial self management techniques and HEP   01/13/17   Status Achieved           PT Long  Term Goals - 01/19/17 8366      PT LONG TERM GOAL #1   Title The patient will be independent in safe self progression of HEP for further gains in ROM, strength and function     Time 8   Period Weeks   Status On-going     PT LONG TERM GOAL #2   Title The patient will report a 60% reduction in back and left LE pain with sitting, standing, driving and in the mornings   Baseline 87%    Status Achieved     PT LONG TERM GOAL #5   Title FOTO functional outcome score improved from 49% limitation to 32% indicating improved function with less pain   Baseline 58% limitation   Time 8               Plan - 02-19-17 1115    Clinical Impression Statement Pt with widespread pain today and is frustrated by intermittent pain. Pt with tension and trigger points in the lumbar spine and gluteals and responded well to flexibility and gentle strength today.  Pt tried electrical stimulation to determine impact on pain.  Pt will continue to benefit from skilled PT for strength, flexibility, manual and  modalities for pain.   PT Frequency 2x / week   PT Duration 8 weeks   PT Treatment/Interventions ADLs/Self Care Home Management;Electrical Stimulation;Cryotherapy;Ultrasound;Moist Heat;Patient/family education;Neuromuscular re-education;Therapeutic exercise;Therapeutic activities;Manual techniques;Taping;Dry needling   PT Next Visit Plan dry needling to gluteals, hip strength, flexibility, core strength   Consulted and Agree with Plan of Care Patient      Patient will benefit from skilled therapeutic intervention in order to improve the following deficits and impairments:  Decreased range of motion, Decreased strength, Increased fascial restricitons, Pain  Visit Diagnosis: Acute bilateral low back pain with left-sided sciatica  Muscle weakness (generalized)       G-Codes - 02/19/2017 1054    Functional Assessment Tool Used (Outpatient Only) FOTO: 58% limitation   Functional Limitation Mobility: Walking and moving around   Mobility: Walking and Moving Around Current Status [phone]) At least 40 percent but less than 60 percent impaired, limited or restricted   Mobility: Walking and Moving Around Goal Status [phone]) At least 20 percent but less than 40 percent impaired, limited or restricted      Problem List Patient Active Problem List   Diagnosis Date Noted  . Hyperlipidemia 10/21/2014  . Chest pain with moderate risk for cardiac etiology 10/20/2014  . Cervical stenosis of spinal canal 03/10/2014  . TOBACCO DEPENDENCE 07/02/2006  . RESTLESS LEGS SYNDROME 07/02/2006  . MITRAL VALVE PROLAPSE 07/02/2006  . MENOPAUSAL SYNDROME 07/02/2006  . DJD, UNSPECIFIED 07/02/2006  . DISC WITH RADICULOPATHY 07/02/2006   Sigurd Sos, PT 02-19-2017 11:40 AM    [facility] [address], [place], [place], [address] Phone: [phone]   Fax:  [phone]  Name: Marilyn Perez MRN: [id number] Date of Birth: [birth year]

## 2017-01-26 ENCOUNTER — Ambulatory Visit: Payer: Medicare Other

## 2017-01-26 DIAGNOSIS — M6281 Muscle weakness (generalized): Secondary | ICD-10-CM

## 2017-01-26 DIAGNOSIS — M5442 Lumbago with sciatica, left side: Secondary | ICD-10-CM

## 2017-01-26 NOTE — Therapy (Signed)
[facility] [address] [place], [place], [address] Phone: [phone]   Fax:  [phone]  Physical Therapy Treatment  Patient Details  Name: Marilyn Perez MRN: [id number] Date of Birth: [birth year] Referring Provider: Dr. Kathyrn Sheriff  Encounter Date: 01/26/2017      PT End of Session - 01/26/17 0848    Visit Number 11   Number of Visits 20   Date for PT Re-Evaluation 02/10/17   Authorization Type Medicare G codes; KX at visit 15   PT Start Time 0759  dry needling   PT Stop Time 0905   PT Time Calculation (min) 66 min   Activity Tolerance Patient tolerated treatment well   Behavior During Therapy [facility] for tasks assessed/performed      Past Medical History:  Diagnosis Date  . Anxiety   . Arthritis    ra  . Hyperlipidemia 10/21/2014  . Osteopenia     Past Surgical History:  Procedure Laterality Date  . ABDOMINAL HYSTERECTOMY    . ANTERIOR CERVICAL DECOMP/DISCECTOMY FUSION N/A 03/10/2014   Procedure: Cervical five-six Anterior Cervical Discectomy with  Fusion and Plating ;  Surgeon: Faythe Ghee, MD;  Location: [place] [facility];  Service: Neurosurgery;  Laterality: N/A;  . BACK SURGERY    . BREAST EXCISIONAL BIOPSY    . CARPAL TUNNEL RELEASE    . CESAREAN SECTION     x 3  . EYE SURGERY      There were no vitals filed for this visit.      Subjective Assessment - 01/26/17 0800    Subjective I am going to the beach this week and will be gone for 2 weeks.     Pertinent History RA with knee flare up;  had for 13 years;  2 neck surgeries;  multi abdominal surgeries;  left foot surgery 1 year ago;  osteopenia/osteoporosis   Currently in Pain? Yes   Pain Score 5    Pain Location Back   Pain Orientation Right;Left;Lower   Pain Descriptors / Indicators Aching;Nagging   Pain Type Acute pain   Pain Onset More than a month ago   Pain Frequency Intermittent   Aggravating Factors  sitting too long,  driving   Pain Relieving Factors change of postion                         [facility] Adult PT Treatment/Exercise - 01/26/17 0001      Lumbar Exercises: Stretches   Active Hamstring Stretch 3 reps;20 seconds   Single Knee to Chest Stretch 3 reps;30 seconds   Lower Trunk Rotation 3 reps;20 seconds     Knee/Hip Exercises: Aerobic   Nustep Level 2x 10 minutes  PT present to discuss progress     Moist Heat Therapy   Number Minutes Moist Heat 15 Minutes   Moist Heat Location Lumbar Spine     Electrical Stimulation   Electrical Stimulation Location low back   Electrical Stimulation Action IFC   Electrical Stimulation Parameters 15 minutes   Electrical Stimulation Goals Pain     Manual Therapy   Manual Therapy Soft tissue mobilization   Manual therapy comments soft tissue elongation and trigger point release to bil gluteals and bil lumbar paraspinals           Trigger Point Dry Needling - 01/26/17 0802    Consent Given? Yes   Muscles Treated Lower Body Gluteus minimus;Piriformis  lumbar multifidi   Gluteus Maximus  Response Twitch response elicited;Palpable increased muscle length   Gluteus Minimus Response Twitch response elicited;Palpable increased muscle length   Piriformis Response Twitch response elicited;Palpable increased muscle length                PT Short Term Goals - 01/12/17 1021      PT SHORT TERM GOAL #1   Title The patient will report a good understanding of initial self management techniques and HEP   01/13/17   Status Achieved           PT Long Term Goals - 01/26/17 0801      PT LONG TERM GOAL #1   Title The patient will be independent in safe self progression of HEP for further gains in ROM, strength and function     Time 8   Period Weeks   Status On-going     PT LONG TERM GOAL #2   Title The patient will report a 60% reduction in back and left LE pain with sitting, standing, driving and in the mornings   Baseline 87%     Status Achieved     PT LONG TERM GOAL #3   Title The patient will have improved lumbo-pelvic core strength to 4/5 needed for standing/walking for > 20 minutes   Time 8   Period Weeks   Status On-going     PT LONG TERM GOAL #5   Title FOTO functional outcome score improved from 49% limitation to 32% indicating improved function with less pain   Baseline 58% limitation   Time 8   Period Weeks   Status On-going               Plan - 01/26/17 0803    Clinical Impression Statement Pt is overall 85% improved.  Pt with continued constant lumbar pain that is nagging and limits sitting and standing long periods.  Pt is limited to sitting for driving and long periods at church.  Pt with tension and trigger points in bil gluteals and lumbar paraspinals and demonstrated improved mobility after dry needling next session.  Pt will attend 1 more session before 2 week vacation.     Rehab Potential Good   PT Frequency 2x / week   PT Duration 8 weeks   PT Treatment/Interventions ADLs/Self Care Home Management;Electrical Stimulation;Cryotherapy;Ultrasound;Moist Heat;Patient/family education;Neuromuscular re-education;Therapeutic exercise;Therapeutic activities;Manual techniques;Taping;Dry needling   PT Next Visit Plan 1 more session.  Check LE strength and hamstring length.  Dry needling 1 more time before vacation   Consulted and Agree with Plan of Care Patient      Patient will benefit from skilled therapeutic intervention in order to improve the following deficits and impairments:  Decreased range of motion, Decreased strength, Increased fascial restricitons, Pain  Visit Diagnosis: Acute bilateral low back pain with left-sided sciatica  Muscle weakness (generalized)     Problem List Patient Active Problem List   Diagnosis Date Noted  . Hyperlipidemia 10/21/2014  . Chest pain with moderate risk for cardiac etiology 10/20/2014  . Cervical stenosis of spinal canal 03/10/2014  . TOBACCO  DEPENDENCE 07/02/2006  . RESTLESS LEGS SYNDROME 07/02/2006  . MITRAL VALVE PROLAPSE 07/02/2006  . MENOPAUSAL SYNDROME 07/02/2006  . DJD, UNSPECIFIED 07/02/2006  . DISC WITH RADICULOPATHY 07/02/2006     Sigurd Sos, PT 01/26/17 8:51 AM  [facility] [address], [place], [place], [address] Phone: [phone]   Fax:  [phone]  Name: Marilyn Perez MRN: [id number] Date of Birth:  10/17/1950   

## 2017-01-28 ENCOUNTER — Ambulatory Visit: Payer: Medicare Other

## 2017-01-28 DIAGNOSIS — M6281 Muscle weakness (generalized): Secondary | ICD-10-CM | POA: Diagnosis not present

## 2017-01-28 DIAGNOSIS — M5442 Lumbago with sciatica, left side: Secondary | ICD-10-CM

## 2017-01-28 NOTE — Therapy (Addendum)
[facility] [address], [place], [place], [address] Phone: [phone]   Fax:  [phone]  Physical Therapy Treatment  Patient Details  Name: Marilyn Perez MRN: [id number] Date of Birth: 1951-02-12 Referring Provider: Dr. Kathyrn Sheriff  Encounter Date: 01/28/2017      PT End of Session - 01/28/17 0932    Visit Number 12   Number of Visits 20   Date for PT Re-Evaluation 02/10/17   Authorization Type Medicare G codes; KX at visit 15   PT Start Time 0845   PT Stop Time 0940   PT Time Calculation (min) 55 min   Activity Tolerance Patient tolerated treatment well   Behavior During Therapy [facility] for tasks assessed/performed      Past Medical History:  Diagnosis Date  . Anxiety   . Arthritis    ra  . Hyperlipidemia 10/21/2014  . Osteopenia     Past Surgical History:  Procedure Laterality Date  . ABDOMINAL HYSTERECTOMY    . ANTERIOR CERVICAL DECOMP/DISCECTOMY FUSION N/A 03/10/2014   Procedure: Cervical five-six Anterior Cervical Discectomy with  Fusion and Plating ;  Surgeon: Faythe Ghee, MD;  Location: [place] [facility];  Service: Neurosurgery;  Laterality: N/A;  . BACK SURGERY    . BREAST EXCISIONAL BIOPSY    . CARPAL TUNNEL RELEASE    . CESAREAN SECTION     x 3  . EYE SURGERY      There were no vitals filed for this visit.      Subjective Assessment - 01/28/17 0840    Subjective Pt will be on vacation x 2 weeks.  Pt would like to get TENs unit today and dry needling to lumbar spine before long car trip.   Pertinent History RA with knee flare up;  had for 13 years;  2 neck surgeries;  multi abdominal surgeries;  left foot surgery 1 year ago;  osteopenia/osteoporosis   Currently in Pain? Yes   Pain Score 5    Pain Location Back   Pain Orientation Right;Left;Lower   Pain Descriptors / Indicators Aching;Nagging   Pain Type Acute pain   Pain Onset More than a month ago   Pain Frequency Intermittent   Aggravating Factors  sitting too long, driving   Pain Relieving Factors change of position, TENs                         [facility] Adult PT Treatment/Exercise - 01/28/17 0001      Lumbar Exercises: Stretches   Active Hamstring Stretch 3 reps;20 seconds   Single Knee to Chest Stretch --   Lower Trunk Rotation --     Knee/Hip Exercises: Aerobic   Nustep Level 2x 8 minutes  PT present to discuss progress     Moist Heat Therapy   Number Minutes Moist Heat 15 Minutes   Moist Heat Location Lumbar Spine     Manual Therapy   Manual Therapy Soft tissue mobilization   Manual therapy comments soft tissue elongation and trigger point release to bil gluteals and bil lumbar paraspinals           Trigger Point Dry Needling - 01/28/17 8588    Consent Given? Yes   Muscles Treated Lower Body --  lumbar multifidi               PT Education - 01/28/17 0842    Education provided Yes   Education Details how to use home TENs  unit   Person(s) Educated Patient   Methods Explanation;Demonstration;Verbal cues   Comprehension Verbalized understanding;Returned demonstration          PT Short Term Goals - 01/12/17 1021      PT SHORT TERM GOAL #1   Title The patient will report a good understanding of initial self management techniques and HEP   01/13/17   Status Achieved           PT Long Term Goals - 01/26/17 0801      PT LONG TERM GOAL #1   Title The patient will be independent in safe self progression of HEP for further gains in ROM, strength and function     Time 8   Period Weeks   Status On-going     PT LONG TERM GOAL #2   Title The patient will report a 60% reduction in back and left LE pain with sitting, standing, driving and in the mornings   Baseline 87%    Status Achieved     PT LONG TERM GOAL #3   Title The patient will have improved lumbo-pelvic core strength to 4/5 needed for standing/walking for > 20 minutes   Time 8   Period Weeks   Status  On-going     PT LONG TERM GOAL #5   Title FOTO functional outcome score improved from 49% limitation to 32% indicating improved function with less pain   Baseline 58% limitation   Time 8   Period Weeks   Status On-going               Plan - 02-24-17 3790    Clinical Impression Statement Pt reports 85% overall improvement.  PT issued home TENs for pain management at home and while on trip.  Pt will be placed on hold x 2 weeks and return only if needed.  Pt with continued lumbar tension and demosntrated improved tissue mobility and reduced pain after dry needling today.     Rehab Potential Good   PT Frequency 2x / week   PT Duration 8 weeks   PT Treatment/Interventions ADLs/Self Care Home Management;Electrical Stimulation;Cryotherapy;Ultrasound;Moist Heat;Patient/family education;Neuromuscular re-education;Therapeutic exercise;Therapeutic activities;Manual techniques;Taping;Dry needling   PT Next Visit Plan Hold chart unil 10/20.  Reassess if pt returns, D/C if pt doesnt.     Consulted and Agree with Plan of Care Patient      Patient will benefit from skilled therapeutic intervention in order to improve the following deficits and impairments:  Decreased range of motion, Decreased strength, Increased fascial restricitons, Pain  Visit Diagnosis: Acute bilateral low back pain with left-sided sciatica  Muscle weakness (generalized)     Problem List Patient Active Problem List   Diagnosis Date Noted  . Hyperlipidemia 10/21/2014  . Chest pain with moderate risk for cardiac etiology 10/20/2014  . Cervical stenosis of spinal canal 03/10/2014  . TOBACCO DEPENDENCE 07/02/2006  . RESTLESS LEGS SYNDROME 07/02/2006  . MITRAL VALVE PROLAPSE 07/02/2006  . MENOPAUSAL SYNDROME 07/02/2006  . DJD, UNSPECIFIED 07/02/2006  . DISC WITH RADICULOPATHY 07/02/2006     Sigurd Sos, PT 2017-02-24 9:34 AM G-codes: Mobility category Goal status: CJ D/C status: CK  PHYSICAL THERAPY  DISCHARGE SUMMARY  Visits from Start of Care: 12  Current functional level related to goals / functional outcomes: See above for current status.  Pt was placed on hold and was to return to PT only if needed.     Remaining deficits: Pt with intermittent LBP.  Pt has HEP in place to address  remaining deficits.     Education / Equipment: HEP, posture/body mechanics  Plan: Patient agrees to discharge.  Patient goals were partially met. Patient is being discharged due to being pleased with the current functional level.  ?????        Sigurd Sos, PT 02/24/17 12:36 PM   [facility] [address], [place], [place], [address] Phone: [phone]   Fax:  [phone]  Name: MARITSA HUNSUCKER MRN: [id number] Date of Birth: 06/07/1950

## 2017-02-17 DIAGNOSIS — M0609 Rheumatoid arthritis without rheumatoid factor, multiple sites: Secondary | ICD-10-CM | POA: Diagnosis not present

## 2017-02-17 DIAGNOSIS — Z79899 Other long term (current) drug therapy: Secondary | ICD-10-CM | POA: Diagnosis not present

## 2017-02-18 DIAGNOSIS — E663 Overweight: Secondary | ICD-10-CM | POA: Diagnosis not present

## 2017-02-18 DIAGNOSIS — Z79899 Other long term (current) drug therapy: Secondary | ICD-10-CM | POA: Diagnosis not present

## 2017-02-18 DIAGNOSIS — M0609 Rheumatoid arthritis without rheumatoid factor, multiple sites: Secondary | ICD-10-CM | POA: Diagnosis not present

## 2017-02-18 DIAGNOSIS — M353 Polymyalgia rheumatica: Secondary | ICD-10-CM | POA: Diagnosis not present

## 2017-02-18 DIAGNOSIS — M159 Polyosteoarthritis, unspecified: Secondary | ICD-10-CM | POA: Diagnosis not present

## 2017-02-18 DIAGNOSIS — Z6826 Body mass index (BMI) 26.0-26.9, adult: Secondary | ICD-10-CM | POA: Diagnosis not present

## 2017-02-18 DIAGNOSIS — M2550 Pain in unspecified joint: Secondary | ICD-10-CM | POA: Diagnosis not present

## 2017-02-19 DIAGNOSIS — E538 Deficiency of other specified B group vitamins: Secondary | ICD-10-CM | POA: Diagnosis not present

## 2017-03-09 DIAGNOSIS — E663 Overweight: Secondary | ICD-10-CM | POA: Diagnosis not present

## 2017-03-09 DIAGNOSIS — M353 Polymyalgia rheumatica: Secondary | ICD-10-CM | POA: Diagnosis not present

## 2017-03-09 DIAGNOSIS — M2550 Pain in unspecified joint: Secondary | ICD-10-CM | POA: Diagnosis not present

## 2017-03-09 DIAGNOSIS — M0609 Rheumatoid arthritis without rheumatoid factor, multiple sites: Secondary | ICD-10-CM | POA: Diagnosis not present

## 2017-03-09 DIAGNOSIS — Z6825 Body mass index (BMI) 25.0-25.9, adult: Secondary | ICD-10-CM | POA: Diagnosis not present

## 2017-03-09 DIAGNOSIS — M159 Polyosteoarthritis, unspecified: Secondary | ICD-10-CM | POA: Diagnosis not present

## 2017-03-09 DIAGNOSIS — M542 Cervicalgia: Secondary | ICD-10-CM | POA: Diagnosis not present

## 2017-03-09 DIAGNOSIS — R634 Abnormal weight loss: Secondary | ICD-10-CM | POA: Diagnosis not present

## 2017-03-25 DIAGNOSIS — M0609 Rheumatoid arthritis without rheumatoid factor, multiple sites: Secondary | ICD-10-CM | POA: Diagnosis not present

## 2017-03-25 DIAGNOSIS — E538 Deficiency of other specified B group vitamins: Secondary | ICD-10-CM | POA: Diagnosis not present

## 2017-03-31 DIAGNOSIS — Z23 Encounter for immunization: Secondary | ICD-10-CM | POA: Diagnosis not present

## 2017-04-05 IMAGING — US ULTRASOUND ABDOMEN COMPLETE
1 series · 14 of 25 positions shown · non-contrast
Comparison: CT scan [DATE]

CLINICAL DATA: Acute pancreatitis.

EXAM:
ABDOMEN ULTRASOUND COMPLETE

[Series 1: ultrasound abdomen complete · 0.15mm/px · 14 of 100 slices shown]
[im 1/100]
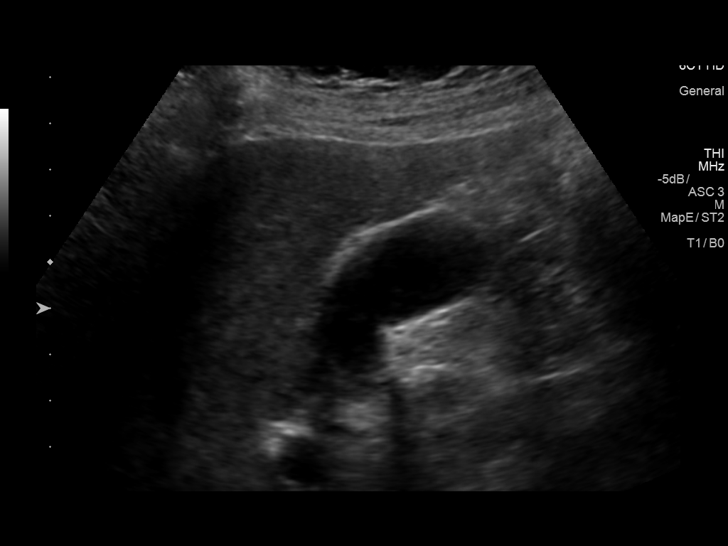
[im 9/100]
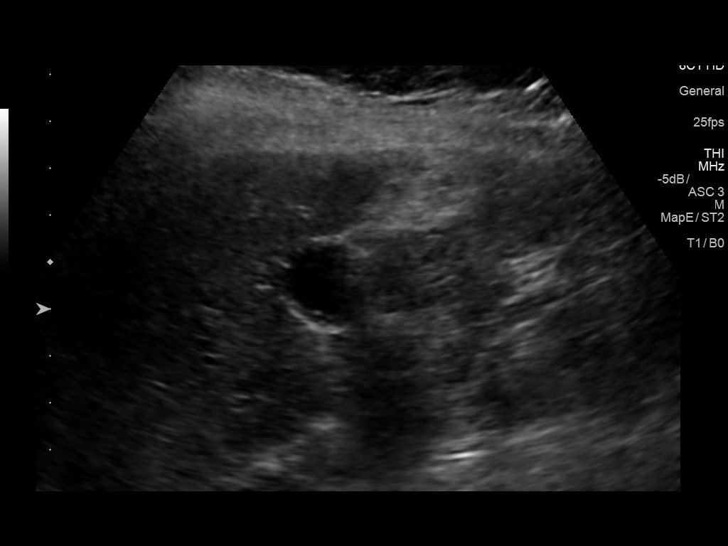
[im 17/100]
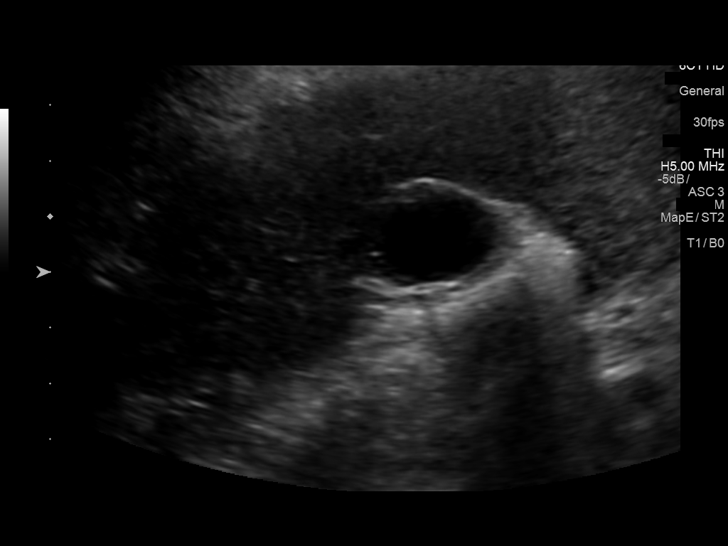
[im 25/100]
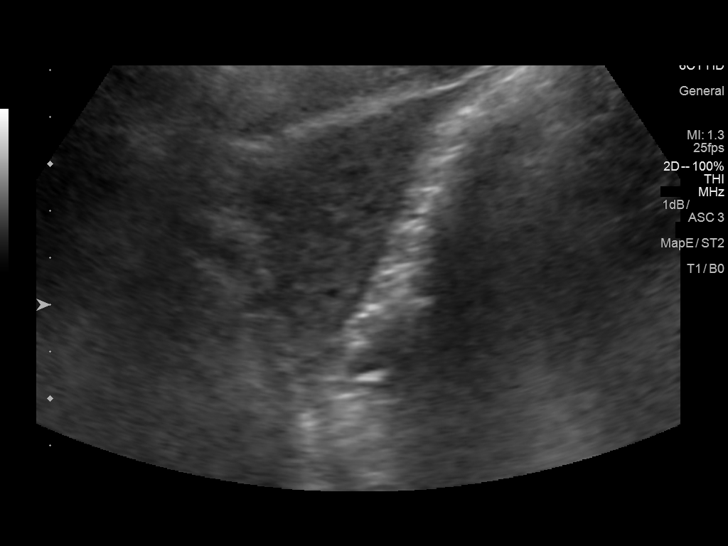
[im 34/100]
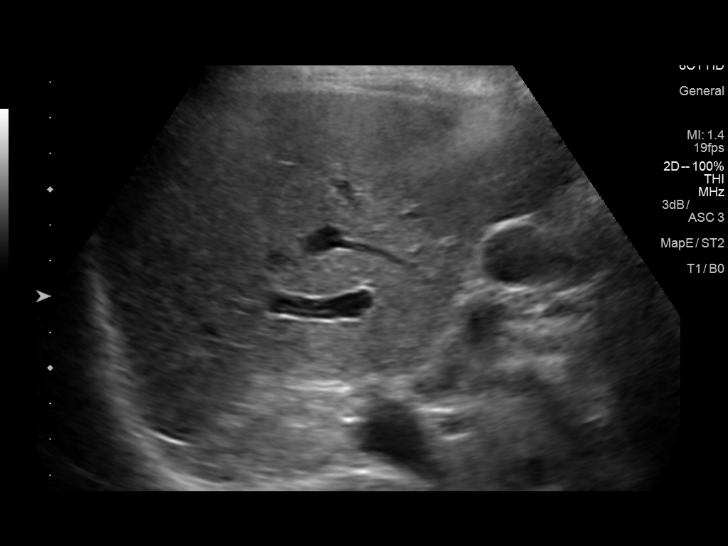
[im 38/100]
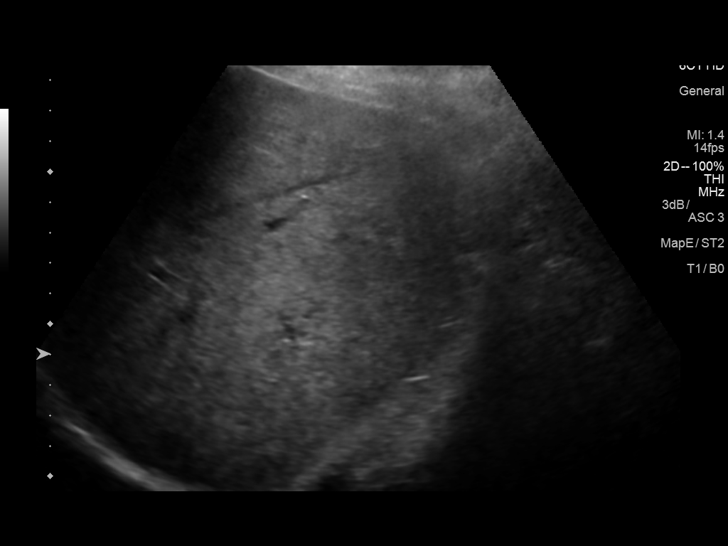
[im 46/100]
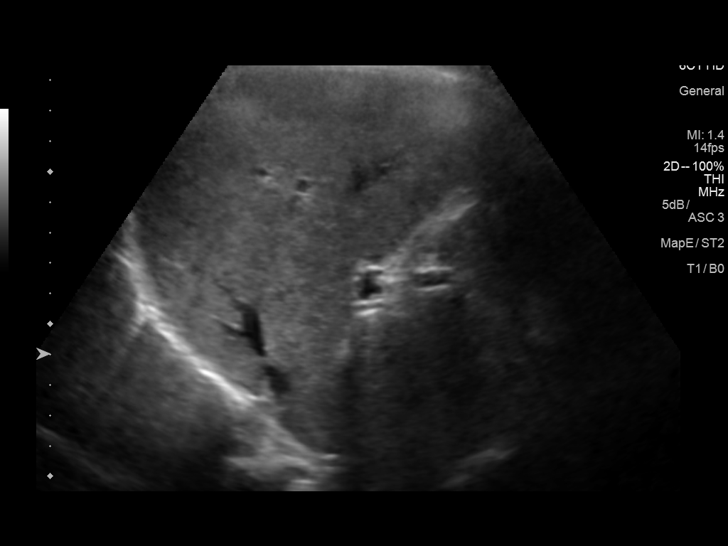
[im 54/100]
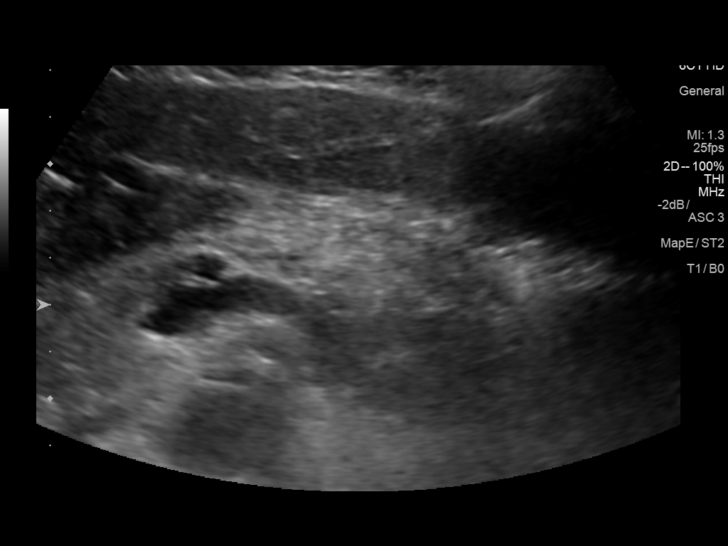
[im 62/100]
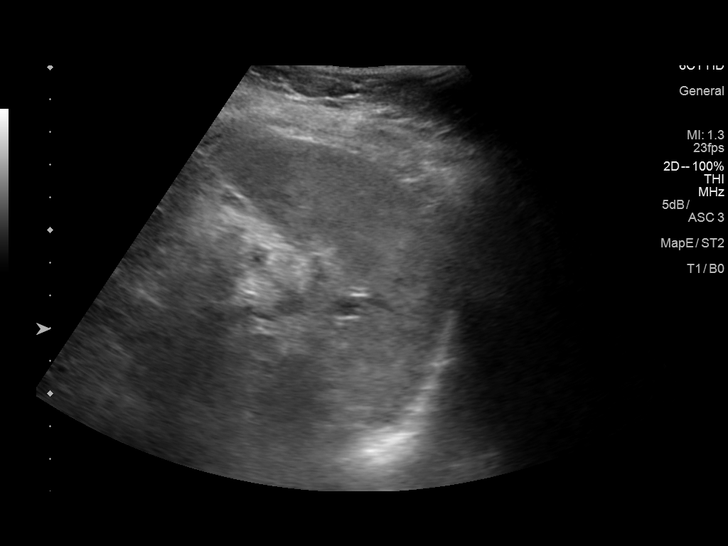
[im 67/100]
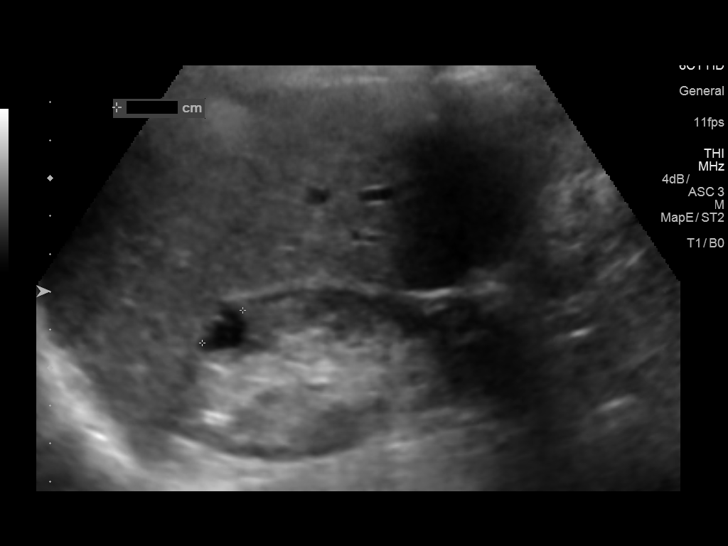
[im 75/100]
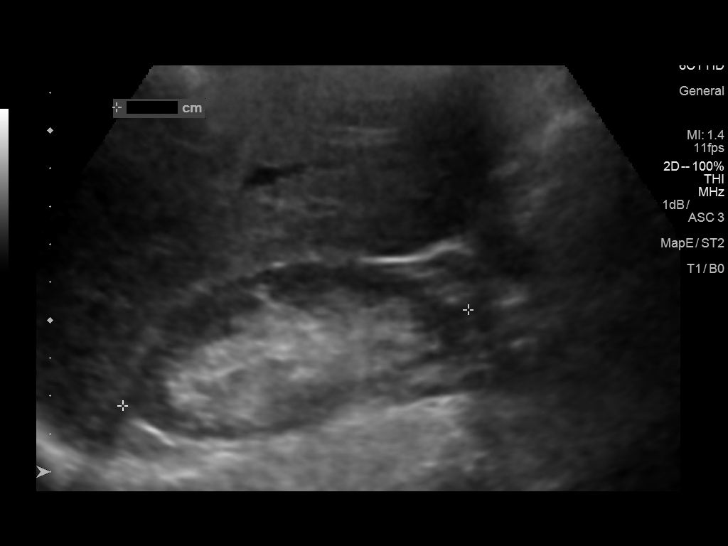
[im 83/100]
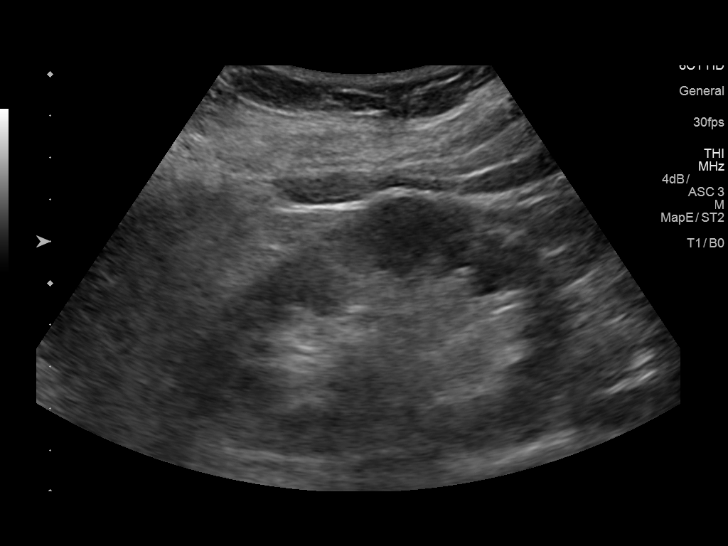
[im 91/100]
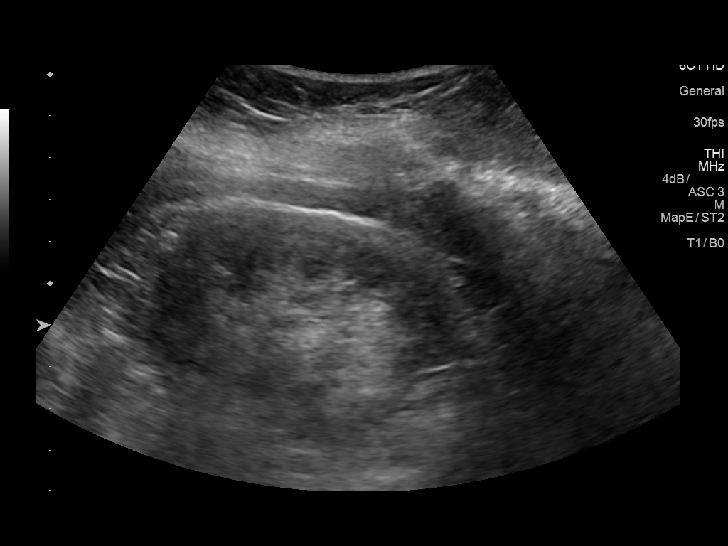
[im 100/100]
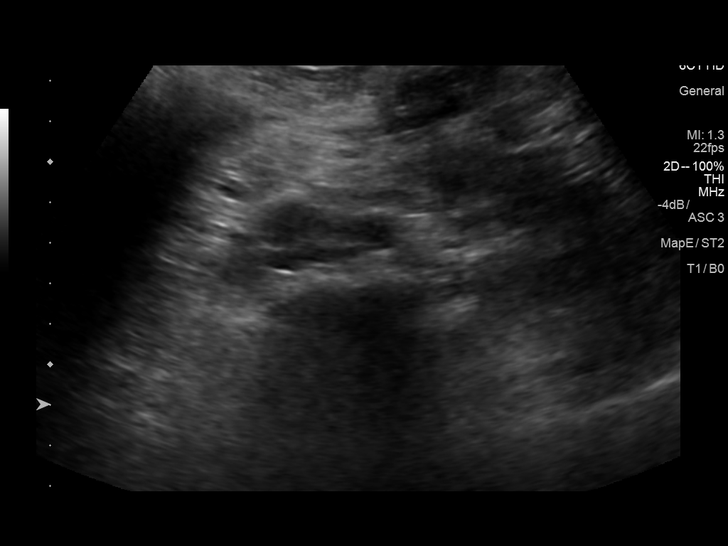

[14 of 25 positions shown; findings below may reference images not displayed]

FINDINGS: Gallbladder: There is a 3.2 mm polyp in the gallbladder not
requiring follow-up. No stones, sludge, wall thickening,
pericholecystic fluid, or Murphy's sign.

Common bile duct: Diameter: 5.1 mm

Liver: Increased echogenicity with no focal mass. Portal vein is
patent on color Doppler imaging with normal direction of blood flow
towards the liver.

IVC: No abnormality visualized.

Pancreas: Visualized portion unremarkable.

Spleen: Size and appearance within normal limits.

Right Kidney: Length: 9.5 cm.  Contains a 1.5 cm cyst.

Left Kidney: Length: 9.5 cm. Echogenicity within normal limits. No
mass or hydronephrosis visualized.

Abdominal aorta: No aneurysm visualized.

Other findings: None.
IMPRESSION: 1. No significant abnormalities. There is a 3.2 mm polyp in the
gallbladder not requiring follow-up.
2. Probable hepatic steatosis.
3. Right renal cyst.

## 2017-04-27 DIAGNOSIS — E538 Deficiency of other specified B group vitamins: Secondary | ICD-10-CM | POA: Diagnosis not present

## 2017-05-06 DIAGNOSIS — J209 Acute bronchitis, unspecified: Secondary | ICD-10-CM | POA: Diagnosis not present

## 2017-05-18 DIAGNOSIS — M069 Rheumatoid arthritis, unspecified: Secondary | ICD-10-CM | POA: Diagnosis not present

## 2017-05-18 DIAGNOSIS — I10 Essential (primary) hypertension: Secondary | ICD-10-CM | POA: Diagnosis not present

## 2017-05-21 DIAGNOSIS — M353 Polymyalgia rheumatica: Secondary | ICD-10-CM | POA: Diagnosis not present

## 2017-05-21 DIAGNOSIS — Z79899 Other long term (current) drug therapy: Secondary | ICD-10-CM | POA: Diagnosis not present

## 2017-05-21 DIAGNOSIS — M0609 Rheumatoid arthritis without rheumatoid factor, multiple sites: Secondary | ICD-10-CM | POA: Diagnosis not present

## 2017-05-21 DIAGNOSIS — Z6825 Body mass index (BMI) 25.0-25.9, adult: Secondary | ICD-10-CM | POA: Diagnosis not present

## 2017-05-21 DIAGNOSIS — M2550 Pain in unspecified joint: Secondary | ICD-10-CM | POA: Diagnosis not present

## 2017-05-21 DIAGNOSIS — E663 Overweight: Secondary | ICD-10-CM | POA: Diagnosis not present

## 2017-05-21 DIAGNOSIS — M159 Polyosteoarthritis, unspecified: Secondary | ICD-10-CM | POA: Diagnosis not present

## 2017-05-26 DIAGNOSIS — M0609 Rheumatoid arthritis without rheumatoid factor, multiple sites: Secondary | ICD-10-CM | POA: Diagnosis not present

## 2017-05-29 DIAGNOSIS — E538 Deficiency of other specified B group vitamins: Secondary | ICD-10-CM | POA: Diagnosis not present

## 2017-06-15 DIAGNOSIS — M170 Bilateral primary osteoarthritis of knee: Secondary | ICD-10-CM | POA: Diagnosis not present

## 2017-06-18 DIAGNOSIS — E538 Deficiency of other specified B group vitamins: Secondary | ICD-10-CM | POA: Diagnosis not present

## 2017-06-18 DIAGNOSIS — M8580 Other specified disorders of bone density and structure, unspecified site: Secondary | ICD-10-CM | POA: Diagnosis not present

## 2017-06-18 DIAGNOSIS — F324 Major depressive disorder, single episode, in partial remission: Secondary | ICD-10-CM | POA: Diagnosis not present

## 2017-06-18 DIAGNOSIS — Z0000 Encounter for general adult medical examination without abnormal findings: Secondary | ICD-10-CM | POA: Diagnosis not present

## 2017-06-18 DIAGNOSIS — E7800 Pure hypercholesterolemia, unspecified: Secondary | ICD-10-CM | POA: Diagnosis not present

## 2017-06-18 DIAGNOSIS — Z1389 Encounter for screening for other disorder: Secondary | ICD-10-CM | POA: Diagnosis not present

## 2017-06-18 DIAGNOSIS — M069 Rheumatoid arthritis, unspecified: Secondary | ICD-10-CM | POA: Diagnosis not present

## 2017-06-19 DIAGNOSIS — M25562 Pain in left knee: Secondary | ICD-10-CM | POA: Diagnosis not present

## 2017-06-22 DIAGNOSIS — M170 Bilateral primary osteoarthritis of knee: Secondary | ICD-10-CM | POA: Diagnosis not present

## 2017-06-23 DIAGNOSIS — M0609 Rheumatoid arthritis without rheumatoid factor, multiple sites: Secondary | ICD-10-CM | POA: Diagnosis not present

## 2017-06-30 DIAGNOSIS — E538 Deficiency of other specified B group vitamins: Secondary | ICD-10-CM | POA: Diagnosis not present

## 2017-07-21 DIAGNOSIS — M0609 Rheumatoid arthritis without rheumatoid factor, multiple sites: Secondary | ICD-10-CM | POA: Diagnosis not present

## 2017-07-29 DIAGNOSIS — E538 Deficiency of other specified B group vitamins: Secondary | ICD-10-CM | POA: Diagnosis not present

## 2017-08-24 DIAGNOSIS — Z79899 Other long term (current) drug therapy: Secondary | ICD-10-CM | POA: Diagnosis not present

## 2017-08-24 DIAGNOSIS — M0609 Rheumatoid arthritis without rheumatoid factor, multiple sites: Secondary | ICD-10-CM | POA: Diagnosis not present

## 2017-08-25 DIAGNOSIS — M0609 Rheumatoid arthritis without rheumatoid factor, multiple sites: Secondary | ICD-10-CM | POA: Diagnosis not present

## 2017-08-25 DIAGNOSIS — E663 Overweight: Secondary | ICD-10-CM | POA: Diagnosis not present

## 2017-08-25 DIAGNOSIS — M159 Polyosteoarthritis, unspecified: Secondary | ICD-10-CM | POA: Diagnosis not present

## 2017-08-25 DIAGNOSIS — M2550 Pain in unspecified joint: Secondary | ICD-10-CM | POA: Diagnosis not present

## 2017-08-25 DIAGNOSIS — Z6825 Body mass index (BMI) 25.0-25.9, adult: Secondary | ICD-10-CM | POA: Diagnosis not present

## 2017-08-25 DIAGNOSIS — Z79899 Other long term (current) drug therapy: Secondary | ICD-10-CM | POA: Diagnosis not present

## 2017-08-31 DIAGNOSIS — E538 Deficiency of other specified B group vitamins: Secondary | ICD-10-CM | POA: Diagnosis not present

## 2017-09-22 DIAGNOSIS — M0609 Rheumatoid arthritis without rheumatoid factor, multiple sites: Secondary | ICD-10-CM | POA: Diagnosis not present

## 2017-10-01 DIAGNOSIS — E538 Deficiency of other specified B group vitamins: Secondary | ICD-10-CM | POA: Diagnosis not present

## 2017-10-05 DIAGNOSIS — M2550 Pain in unspecified joint: Secondary | ICD-10-CM | POA: Diagnosis not present

## 2017-10-05 DIAGNOSIS — M0609 Rheumatoid arthritis without rheumatoid factor, multiple sites: Secondary | ICD-10-CM | POA: Diagnosis not present

## 2017-10-05 DIAGNOSIS — G5701 Lesion of sciatic nerve, right lower limb: Secondary | ICD-10-CM | POA: Diagnosis not present

## 2017-10-05 DIAGNOSIS — Z79899 Other long term (current) drug therapy: Secondary | ICD-10-CM | POA: Diagnosis not present

## 2017-10-05 DIAGNOSIS — E663 Overweight: Secondary | ICD-10-CM | POA: Diagnosis not present

## 2017-10-05 DIAGNOSIS — Z6825 Body mass index (BMI) 25.0-25.9, adult: Secondary | ICD-10-CM | POA: Diagnosis not present

## 2017-10-05 DIAGNOSIS — M159 Polyosteoarthritis, unspecified: Secondary | ICD-10-CM | POA: Diagnosis not present

## 2017-10-20 DIAGNOSIS — M0609 Rheumatoid arthritis without rheumatoid factor, multiple sites: Secondary | ICD-10-CM | POA: Diagnosis not present

## 2017-11-24 DIAGNOSIS — Z79899 Other long term (current) drug therapy: Secondary | ICD-10-CM | POA: Diagnosis not present

## 2017-11-24 DIAGNOSIS — M353 Polymyalgia rheumatica: Secondary | ICD-10-CM | POA: Diagnosis not present

## 2017-11-24 DIAGNOSIS — Z6827 Body mass index (BMI) 27.0-27.9, adult: Secondary | ICD-10-CM | POA: Diagnosis not present

## 2017-11-24 DIAGNOSIS — M2550 Pain in unspecified joint: Secondary | ICD-10-CM | POA: Diagnosis not present

## 2017-11-24 DIAGNOSIS — M0609 Rheumatoid arthritis without rheumatoid factor, multiple sites: Secondary | ICD-10-CM | POA: Diagnosis not present

## 2017-11-24 DIAGNOSIS — M159 Polyosteoarthritis, unspecified: Secondary | ICD-10-CM | POA: Diagnosis not present

## 2017-11-24 DIAGNOSIS — E663 Overweight: Secondary | ICD-10-CM | POA: Diagnosis not present

## 2017-11-25 DIAGNOSIS — M0609 Rheumatoid arthritis without rheumatoid factor, multiple sites: Secondary | ICD-10-CM | POA: Diagnosis not present

## 2017-11-26 DIAGNOSIS — M542 Cervicalgia: Secondary | ICD-10-CM | POA: Diagnosis not present

## 2017-12-10 DIAGNOSIS — M542 Cervicalgia: Secondary | ICD-10-CM | POA: Diagnosis not present

## 2017-12-15 ENCOUNTER — Other Ambulatory Visit: Payer: Self-pay | Admitting: Physician Assistant

## 2017-12-15 DIAGNOSIS — M542 Cervicalgia: Secondary | ICD-10-CM

## 2017-12-16 ENCOUNTER — Ambulatory Visit
Admission: RE | Admit: 2017-12-16 | Discharge: 2017-12-16 | Disposition: A | Discharge status: Home / Self Care | Payer: Medicare Other | Source: Ambulatory Visit | Attending: Physician Assistant | Admitting: Physician Assistant

## 2017-12-16 DIAGNOSIS — M47812 Spondylosis without myelopathy or radiculopathy, cervical region: Secondary | ICD-10-CM | POA: Diagnosis not present

## 2017-12-16 DIAGNOSIS — M542 Cervicalgia: Secondary | ICD-10-CM

## 2017-12-16 IMAGING — CT CT CERVICAL SPINE W/O CM
3 of 5 series · 11 of 33 positions shown, 13 images · non-contrast
Comparison: Cervical spine radiograph dated [DATE] and cervical
spine MR dated [DATE].

CLINICAL DATA: Posterior neck pain with numbness. Previous anterior
discectomy and fusion in [[place]].

EXAM:
CT CERVICAL SPINE WITHOUT CONTRAST
TECHNIQUE: Multidetector CT imaging of the cervical spine was performed without
intravenous contrast. Multiplanar CT image reconstructions were also
generated.

[Series 2: c-spine 2.00 br60 s3 axial bone · axial · 0.22mm/px · z∈[-865,-765]mm · 3 of 100 slices shown, 4 images]
[im 25/100  soft-tissue]
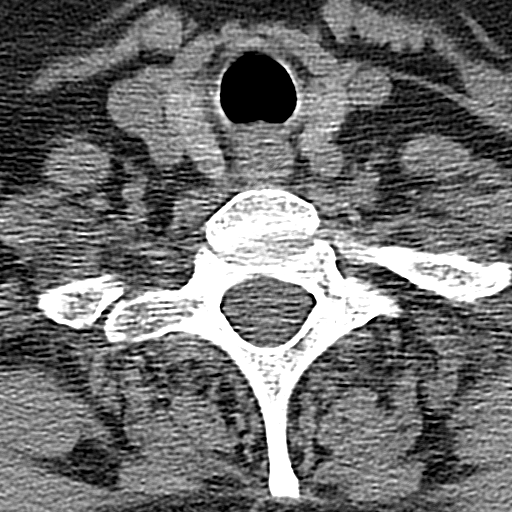
[im 25/100  bone]
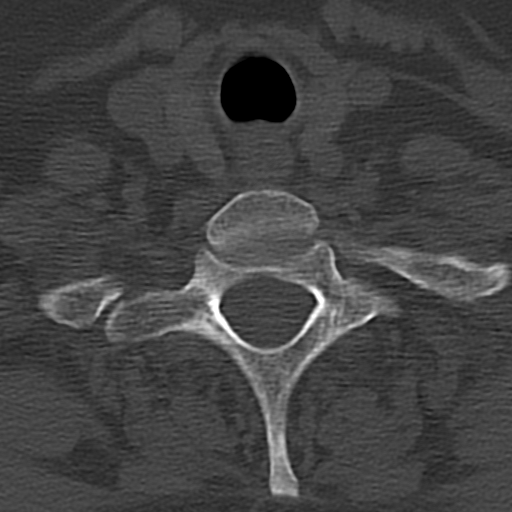
[im 50/100  bone]
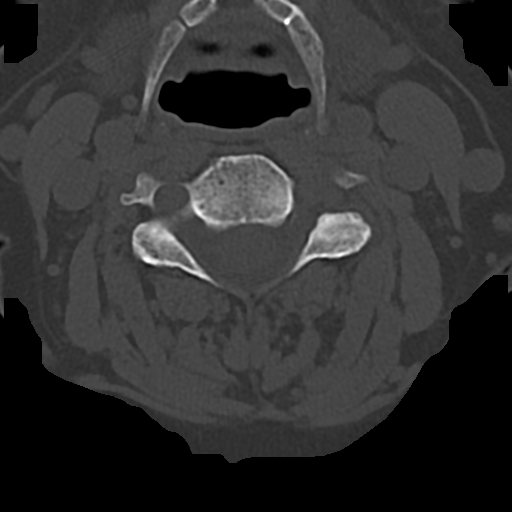
[im 75/100  bone]
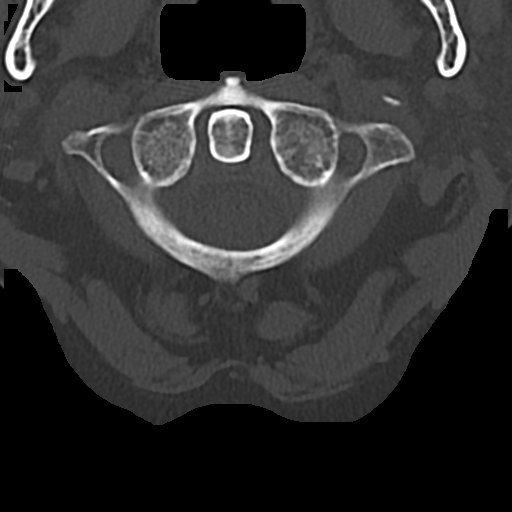

[Series 4: c-spine 2.00 br60 s3 sag sag bone · sagittal · 0.22mm/px · 5 of 55 slices shown, 6 images]
[im 19/55  bone]
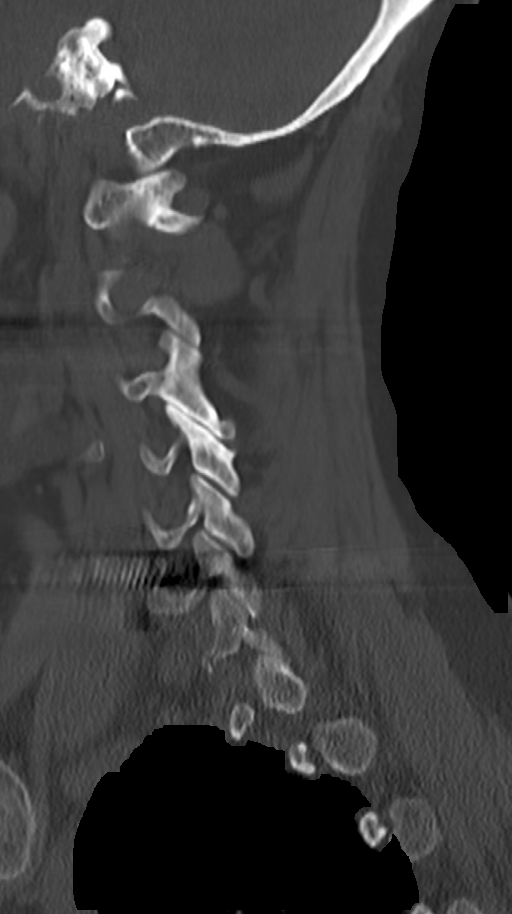
[im 23/55  bone]
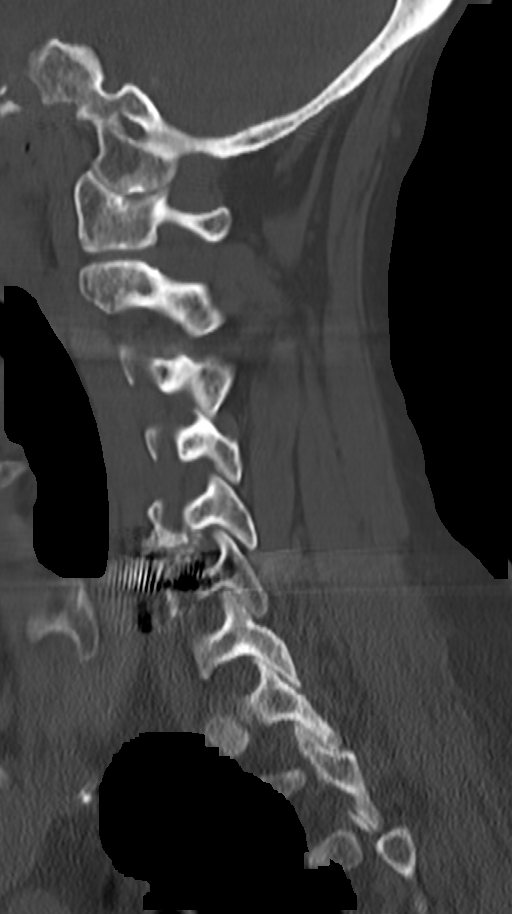
[im 28/55  soft-tissue]
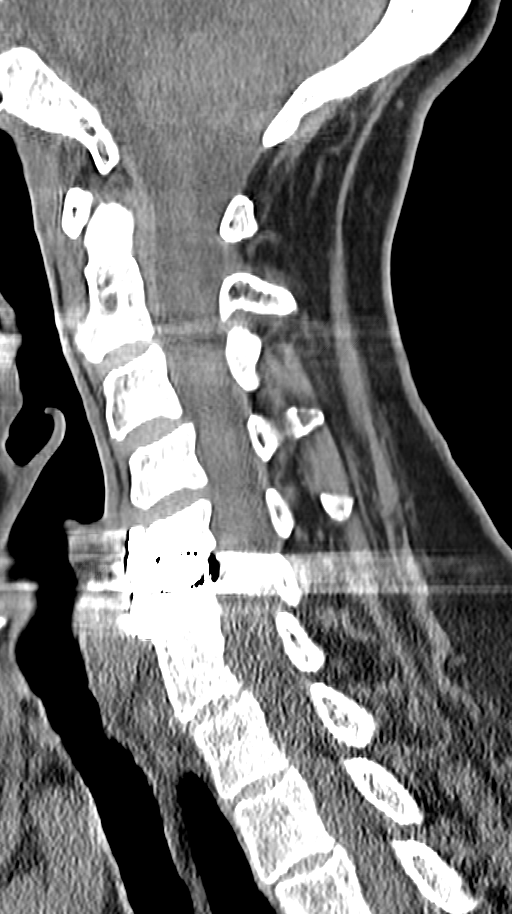
[im 28/55  bone]
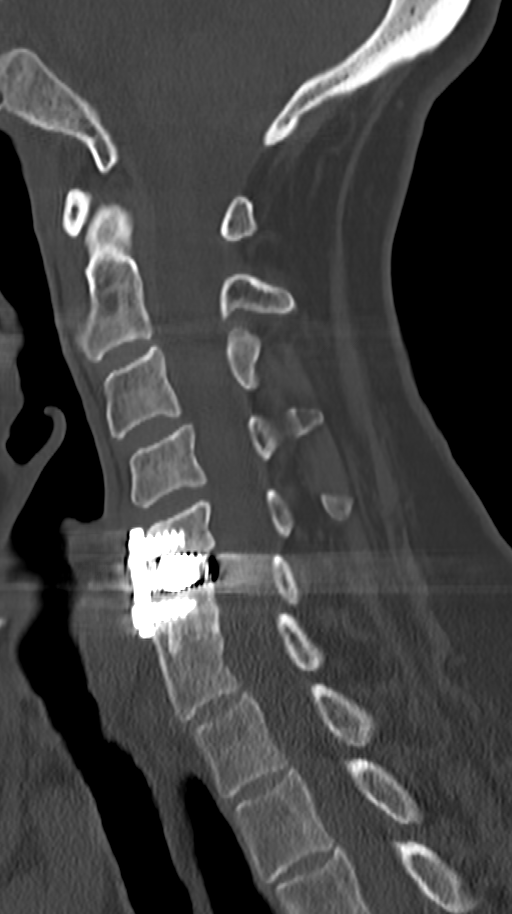
[im 32/55  bone]
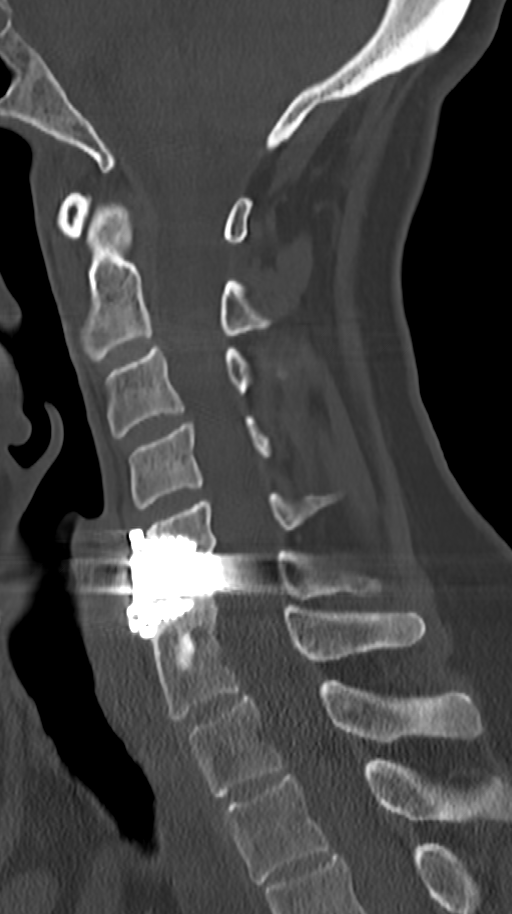
[im 37/55  bone]
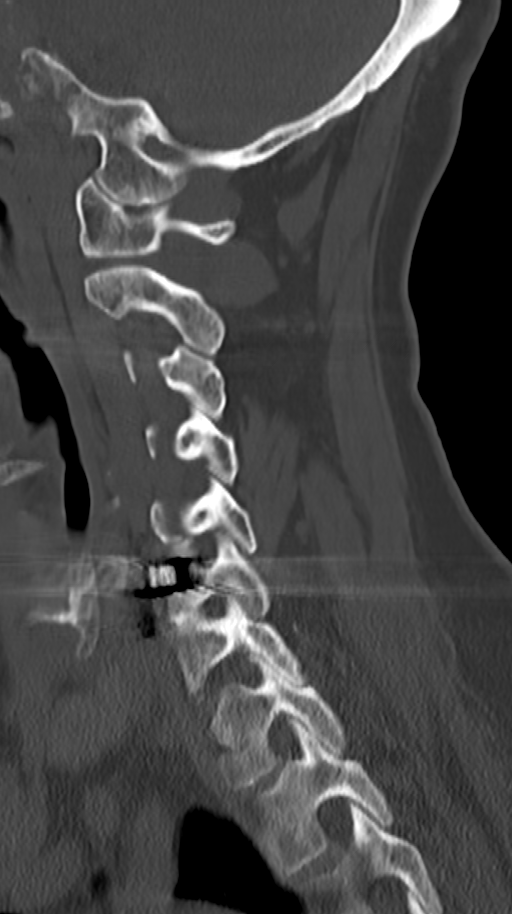

[Series 6: c-spine 2.00 hr60 s3 cor cor bone · coronal · 0.22mm/px · 3 of 55 slices shown]
[im 11/55  bone]
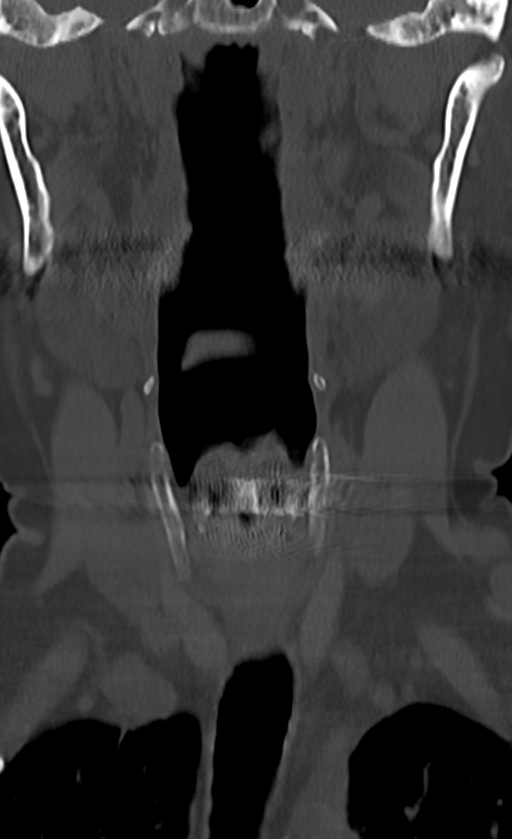
[im 22/55  bone]
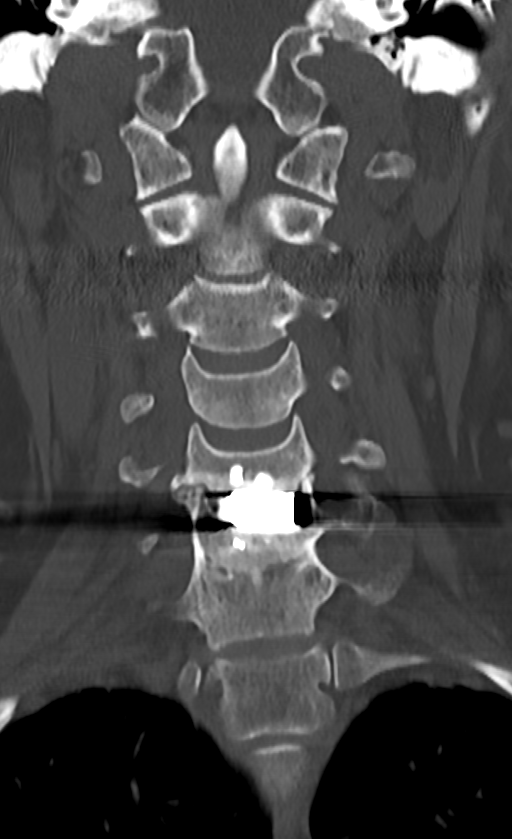
[im 33/55  bone]
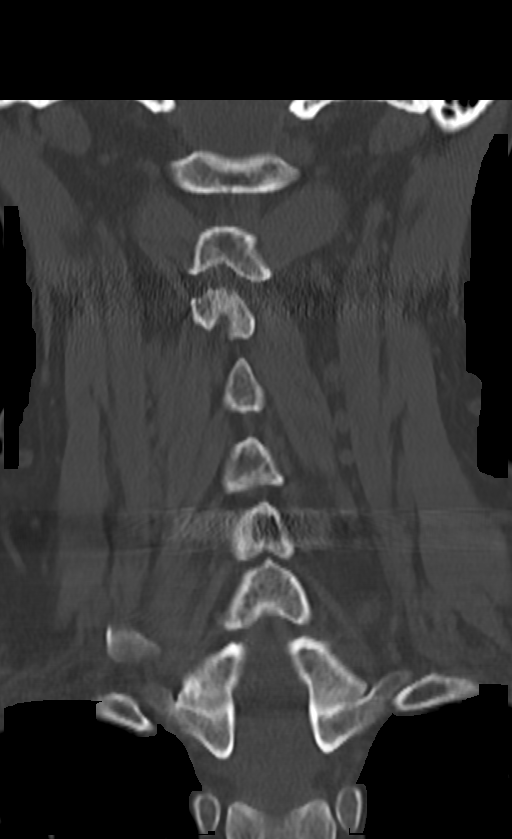

[11 of 33 positions shown; findings below may reference images not displayed]

FINDINGS: Alignment: Reversal of the normal lordosis in the mid and upper
cervical spine. Minimal anterolisthesis at the C3-4 level.

Skull base and vertebrae: No acute fracture. No primary bone lesion
or focal pathologic process.

Soft tissues and spinal canal: No prevertebral fluid or swelling. No
visible canal hematoma.

Disc levels:  C2-3: Unremarkable.

C3-4: Minimal posterior spur formation without significant canal or
foraminal stenosis.

C4-5: Unremarkable.

C5-6: Interbody and anterior hardware fusion with associated streak
artifacts. The artifacts or obscuring the details of the spinal
canal at that level. No canal or foraminal stenosis seen.

C6-7: Solid interbody bone fusion with normal alignment and no canal
or foraminal stenosis.

C7-T1: Unremarkable.

T1-2: Unremarkable.

T2-3: Unremarkable.

Upper chest: Clear lung apices. Mild right apical pleural and
parenchymal scarring.

Other: None.
IMPRESSION: 1. Postoperative and minimal degenerative changes, as described
above. No evidence of neural compression at any level.
2. Reversal of the normal lordosis in the mid and upper cervical
spine.

## 2017-12-17 DIAGNOSIS — M542 Cervicalgia: Secondary | ICD-10-CM | POA: Diagnosis not present

## 2017-12-21 DIAGNOSIS — R030 Elevated blood-pressure reading, without diagnosis of hypertension: Secondary | ICD-10-CM | POA: Diagnosis not present

## 2017-12-21 DIAGNOSIS — Z6826 Body mass index (BMI) 26.0-26.9, adult: Secondary | ICD-10-CM | POA: Diagnosis not present

## 2017-12-21 DIAGNOSIS — M542 Cervicalgia: Secondary | ICD-10-CM | POA: Diagnosis not present

## 2017-12-23 ENCOUNTER — Ambulatory Visit: Payer: Medicare Other | Attending: Physician Assistant | Admitting: Physical Therapy

## 2017-12-23 ENCOUNTER — Other Ambulatory Visit: Payer: Self-pay

## 2017-12-23 DIAGNOSIS — M6281 Muscle weakness (generalized): Secondary | ICD-10-CM

## 2017-12-23 DIAGNOSIS — M5442 Lumbago with sciatica, left side: Secondary | ICD-10-CM | POA: Insufficient documentation

## 2017-12-23 DIAGNOSIS — M62838 Other muscle spasm: Secondary | ICD-10-CM | POA: Insufficient documentation

## 2017-12-23 DIAGNOSIS — R293 Abnormal posture: Secondary | ICD-10-CM | POA: Insufficient documentation

## 2017-12-23 NOTE — Patient Instructions (Addendum)
Trigger Point Dry Needling  . What is Trigger Point Dry Needling (DN)? o DN is a physical therapy technique used to treat muscle pain and dysfunction. Specifically, DN helps deactivate muscle trigger points (muscle knots).  o A thin filiform needle is used to penetrate the skin and stimulate the underlying trigger point. The goal is for a local twitch response (LTR) to occur and for the trigger point to relax. No medication of any kind is injected during the procedure.   . What Does Trigger Point Dry Needling Feel Like?  o The procedure feels different for each individual patient. Some patients report that they do not actually feel the needle enter the skin and overall the process is not painful. Very mild bleeding may occur. However, many patients feel a deep cramping in the muscle in which the needle was inserted. This is the local twitch response.   Marland Kitchen How Will I feel after the treatment? o Soreness is normal, and the onset of soreness may not occur for a few hours. Typically this soreness does not last longer than two days.  o Bruising is uncommon, however; ice can be used to decrease any possible bruising.  o In rare cases feeling tired or nauseous after the treatment is normal. In addition, your symptoms may get worse before they get better, this period will typically not last longer than 24 hours.   . What Can I do After My Treatment? o Increase your hydration by drinking more water for the next 24 hours. o You may place ice or heat on the areas treated that have become sore, however, do not use heat on inflamed or bruised areas. Heat often brings more relief post needling. o You can continue your regular activities, but vigorous activity is not recommended initially after the treatment for 24 hours. o DN is best combined with other physical therapy such as strengthening, stretching, and other therapies.    [facility] [address] [place], [place]  [address] Phone # [phone] Fax [phone]  Access Code: [email or web address]  URL: [email or web address]  Date: 12/24/2017  Prepared by: Lovett Calender   Exercises  Seated Cervical Rotation AROM - 10 reps - 1 sets - 1x daily - 7x weekly  Seated Cervical Flexion AROM - 10 reps - 1 sets - 1x daily - 7x weekly  Seated Cervical Sidebending Stretch - 10 reps - 1 sets - 1x daily - 7x weekly  Patient Education  Office Posture

## 2017-12-24 DIAGNOSIS — M0609 Rheumatoid arthritis without rheumatoid factor, multiple sites: Secondary | ICD-10-CM | POA: Diagnosis not present

## 2017-12-24 NOTE — Therapy (Signed)
[facility] [address], [place], [place], [address] Phone: [phone]   Fax:  [phone]  Physical Therapy Evaluation  Patient Details  Name: Marilyn Perez MRN: [id number] Date of Birth: 02/04/1951 Referring Provider: Traci Sermon, PA-C   Encounter Date: 12/23/2017  PT End of Session - 12/23/17 1014    Visit Number  1    Date for PT Re-Evaluation  02/03/18    Authorization Type  medicare    PT Start Time  0930    PT Stop Time  1013    PT Time Calculation (min)  43 min    Activity Tolerance  Patient tolerated treatment well    Behavior During Therapy  [facility] for tasks assessed/performed       Past Medical History:  Diagnosis Date  . Anxiety   . Arthritis    ra  . Hyperlipidemia 10/21/2014  . Osteopenia     Past Surgical History:  Procedure Laterality Date  . ABDOMINAL HYSTERECTOMY    . ANTERIOR CERVICAL DECOMP/DISCECTOMY FUSION N/A 03/10/2014   Procedure: Cervical five-six Anterior Cervical Discectomy with  Fusion and Plating ;  Surgeon: Faythe Ghee, MD;  Location: [place] [facility];  Service: Neurosurgery;  Laterality: N/A;  . BACK SURGERY    . BREAST EXCISIONAL BIOPSY    . CARPAL TUNNEL RELEASE    . CESAREAN SECTION     x 3  . EYE SURGERY      There were no vitals filed for this visit.   Subjective Assessment - 12/23/17 0940    Subjective  It started about 6 weeks ago.  Notice it most when sweeping or walking. I have 4-5 really bad days every week. I walk my dog several times a day and haven't been able to play golf.  Reports the imaging shows arthritis.    Diagnostic tests  MRI, CT scan, x-rays    Patient Stated Goals  get rid of the tingling and be able to return to normal walking    Currently in Pain?  Yes   today no pain   Pain Score  0-No pain   can be up to 10/10   Pain Location  Neck    Pain Orientation  Right    Pain Descriptors / Indicators  Tingling;Burning;Tender    Pain  Type  Chronic pain    Pain Radiating Towards  back of the neck    Pain Onset  More than a month ago    Pain Frequency  Intermittent    Aggravating Factors   can happen anytime but walking and sweeping    Pain Relieving Factors  soft collar helps    Effect of Pain on Daily Activities  unable to golf, walk dog, normal housework on bad days    Multiple Pain Sites  No         [facility] PT Assessment - 12/24/17 0001      Assessment   Medical Diagnosis  M54.2 (ICD-10-CM) - Cervicalgia    Referring Provider  Traci Sermon, PA-C    Prior Therapy  No      Precautions   Precautions  None      Restrictions   Weight Bearing Restrictions  No      Home Environment   Living Environment  Private residence      Prior Function   Leisure  walking, golf      Cognition   Overall Cognitive Status  Within Functional Limits for tasks  assessed      Observation/Other Assessments   Focus on Therapeutic Outcomes (FOTO)   40% limited      Posture/Postural Control   Posture/Postural Control  Postural limitations    Postural Limitations  Rounded Shoulders;Forward head;Increased thoracic kyphosis      AROM   Cervical Flexion  65    Cervical Extension  35    Cervical - Right Side Bend  40    Cervical - Left Side Bend  40    Cervical - Right Rotation  50    Cervical - Left Rotation  65      Strength   Overall Strength Comments  Rt cervical rotation 4/5 no pain      Palpation   Palpation comment  tight suboccipitals, upper traps, pecs, cervical paraspinals, decreased mobility cervical vertebrea      Special Tests    Special Tests  Cervical    Cervical Tests  Dictraction      Distraction Test   Findngs  Positive    Comment  felt better      Ambulation/Gait   Gait Pattern  Within Functional Limits                Objective measurements completed on examination: See above findings.      [facility] Adult PT Treatment/Exercise - 12/24/17 0001      Self-Care   Self-Care  Other  Self-Care Comments    Other Self-Care Comments   educated on inital HEP      Modalities   Modalities  Moist Heat      Moist Heat Therapy   Number Minutes Moist Heat  5 Minutes    Moist Heat Location  Cervical      Manual Therapy   Manual Therapy  Soft tissue mobilization    Soft tissue mobilization  suboccipitals cervical paraspinals       Trigger Point Dry Needling - 12/24/17 1350    Consent Given?  Yes    Education Handout Provided  Yes    Muscles Treated Upper Body  Suboccipitals muscle group   cervical multifidi Rt side only   SubOccipitals Response  Twitch response elicited;Palpable increased muscle length   Rt side          PT Education - 12/24/17 1335    Education Details   Access Code: [email or web address]     Person(s) Educated  Patient    Methods  Explanation;Demonstration;Handout;Verbal cues    Comprehension  Verbalized understanding;Returned demonstration          PT Long Term Goals - 12/23/17 1018      PT LONG TERM GOAL #1   Title  Pt will be ind with HEP    Time  6    Period  Weeks    Status  New    Target Date  02/03/18      PT LONG TERM GOAL #2   Title  Pt will have 60% reduction in cervical pain when driving and walking    Time  6    Period  Weeks    Status  New    Target Date  02/03/18      PT LONG TERM GOAL #3   Title  Pt will have Rt cervical rotation 60 degrees or more for improved ability to drive    Time  6    Period  Weeks    Status  New    Target Date  02/03/18      PT LONG TERM  GOAL #4   Title  Pt will be able to return to hitting golf balls without increased neck pain as part of healthy active lifestyle    Time  6    Period  Weeks    Status  New    Target Date  02/03/18      PT LONG TERM GOAL #5   Title  ...    Baseline  ...             Plan - 12/24/17 1335    Clinical Impression Statement  Pt presents to skilled PT due to neck pain on right side.  Pt demonstrates decreased cervical ROM. She has muscle spasms as listed  above.  She has cervical muscle weakness along with posture abnormalities as detailed above.  She had positive result from distraction.  Pt will benefit from skilled PT to address these impairments and return to maximum functional and recreational activities.    History and Personal Factors relevant to plan of care:  chronic pain, arthritis    Clinical Presentation  Stable    Clinical Presentation due to:  pt is stable    Clinical Decision Making  Low    Rehab Potential  Excellent    PT Frequency  2x / week    PT Duration  6 weeks    PT Treatment/Interventions  ADLs/Self Care Home Management;Cryotherapy;Electrical Stimulation;Moist Heat;Traction;Therapeutic activities;Therapeutic exercise;Neuromuscular re-education;Patient/family education;Manual techniques;Dry needling;Taping;Passive range of motion    PT Next Visit Plan  DN to cervical multifidi, thoracic mobility, cervical ROM, posture    PT Home Exercise Plan   Access Code: [email or web address]     Consulted and Agree with Plan of Care  Patient       Patient will benefit from skilled therapeutic intervention in order to improve the following deficits and impairments:  Pain, Increased fascial restricitons, Increased muscle spasms, Postural dysfunction, Decreased strength, Decreased range of motion, Hypomobility  Visit Diagnosis: Other muscle spasm  Muscle weakness (generalized)  Abnormal posture     Problem List Patient Active Problem List   Diagnosis Date Noted  . Hyperlipidemia 10/21/2014  . Chest pain with moderate risk for cardiac etiology 10/20/2014  . Cervical stenosis of spinal canal 03/10/2014  . TOBACCO DEPENDENCE 07/02/2006  . RESTLESS LEGS SYNDROME 07/02/2006  . MITRAL VALVE PROLAPSE 07/02/2006  . MENOPAUSAL SYNDROME 07/02/2006  . DJD, UNSPECIFIED 07/02/2006  . DISC WITH RADICULOPATHY 07/02/2006    Zannie Cove, PT 12/24/2017, 2:15 PM  [facility] [address],  [place], [place], [address] Phone: [phone]   Fax:  [phone]  Name: DEMARI KROPP MRN: [id number] Date of Birth: 02/13/1951

## 2017-12-24 NOTE — Therapy (Signed)
[facility] [address], [place], [place], [address] Phone: [phone]   Fax:  [phone]  Physical Therapy Evaluation  Patient Details  Name: Marilyn Perez MRN: [id number] Date of Birth: [birth year] Referring Provider: Traci Sermon, PA-C   Encounter Date: 12/23/2017  PT End of Session - 12/23/17 1014    Visit Number  1    Date for PT Re-Evaluation  02/03/18    Authorization Type  medicare    PT Start Time  0930    PT Stop Time  1013    PT Time Calculation (min)  43 min    Activity Tolerance  Patient tolerated treatment well    Behavior During Therapy  [facility] for tasks assessed/performed       Past Medical History:  Diagnosis Date  . Anxiety   . Arthritis    ra  . Hyperlipidemia 10/21/2014  . Osteopenia     Past Surgical History:  Procedure Laterality Date  . ABDOMINAL HYSTERECTOMY    . ANTERIOR CERVICAL DECOMP/DISCECTOMY FUSION N/A 03/10/2014   Procedure: Cervical five-six Anterior Cervical Discectomy with  Fusion and Plating ;  Surgeon: Faythe Ghee, MD;  Location: [place] [facility];  Service: Neurosurgery;  Laterality: N/A;  . BACK SURGERY    . BREAST EXCISIONAL BIOPSY    . CARPAL TUNNEL RELEASE    . CESAREAN SECTION     x 3  . EYE SURGERY      There were no vitals filed for this visit.   Subjective Assessment - 12/23/17 0940    Subjective  It started about 6 weeks ago.  Notice it most when sweeping or walking. I have 4-5 really bad days every week. I walk my dog several times a day and haven't been able to play golf.  Reports the imaging shows arthritis. Pt also complains of pain in hand.    Pertinent History  arthritis    Diagnostic tests  MRI, CT scan, x-rays    Patient Stated Goals  get rid of the tingling and be able to return to normal walking    Currently in Pain?  Yes   today no pain   Pain Score  0-No pain   can be up to 10/10   Pain Location  Neck    Pain Orientation  Right     Pain Descriptors / Indicators  Tingling;Burning;Tender    Pain Type  Chronic pain    Pain Radiating Towards  back of the neck    Pain Onset  More than a month ago    Pain Frequency  Intermittent    Aggravating Factors   can happen anytime but walking and sweeping    Pain Relieving Factors  soft collar helps    Effect of Pain on Daily Activities  unable to golf, walk dog, normal housework on bad days    Multiple Pain Sites  No         [facility] PT Assessment - 12/24/17 0001      Assessment   Medical Diagnosis  M54.2 (ICD-10-CM) - Cervicalgia    Referring Provider  Traci Sermon, PA-C    Prior Therapy  No      Precautions   Precautions  None      Restrictions   Weight Bearing Restrictions  No      Home Environment   Living Environment  Private residence      Prior Function   Leisure  walking, golf  Cognition   Overall Cognitive Status  Within Functional Limits for tasks assessed      Observation/Other Assessments   Focus on Therapeutic Outcomes (FOTO)   40% limited      Posture/Postural Control   Posture/Postural Control  Postural limitations    Postural Limitations  Rounded Shoulders;Forward head;Increased thoracic kyphosis      AROM   Cervical Flexion  65    Cervical Extension  35    Cervical - Right Side Bend  40    Cervical - Left Side Bend  40    Cervical - Right Rotation  50    Cervical - Left Rotation  65      Strength   Overall Strength Comments  Rt cervical rotation 4/5 no pain      Palpation   Palpation comment  tight suboccipitals, upper traps, pecs, cervical paraspinals, decreased mobility cervical vertebrea      Special Tests    Special Tests  Cervical    Cervical Tests  Dictraction      Distraction Test   Findngs  Positive    Comment  felt better      Ambulation/Gait   Gait Pattern  Within Functional Limits                Objective measurements completed on examination: See above findings.      [facility]  Treatment/Exercise - 12/24/17 0001      Self-Care   Self-Care  Other Self-Care Comments    Other Self-Care Comments   educated on inital HEP      Modalities   Modalities  Moist Heat      Moist Heat Therapy   Number Minutes Moist Heat  5 Minutes    Moist Heat Location  Cervical      Manual Therapy   Manual Therapy  Soft tissue mobilization    Soft tissue mobilization  suboccipitals cervical paraspinals       Trigger Point Dry Needling - 12/24/17 1350    Consent Given?  Yes    Education Handout Provided  Yes    Muscles Treated Upper Body  Suboccipitals muscle group   cervical multifidi Rt side only   SubOccipitals Response  Twitch response elicited;Palpable increased muscle length   Rt side          PT Education - 12/24/17 1335    Education Details   Access Code: [email or web address]     Person(s) Educated  Patient    Methods  Explanation;Demonstration;Handout;Verbal cues    Comprehension  Verbalized understanding;Returned demonstration          PT Long Term Goals - 12/23/17 1018      PT LONG TERM GOAL #1   Title  Pt will be ind with HEP    Time  6    Period  Weeks    Status  New    Target Date  02/03/18      PT LONG TERM GOAL #2   Title  Pt will have 60% reduction in cervical pain when driving and walking    Time  6    Period  Weeks    Status  New    Target Date  02/03/18      PT LONG TERM GOAL #3   Title  Pt will have Rt cervical rotation 60 degrees or more for improved ability to drive    Time  6    Period  Weeks    Status  New  Target Date  02/03/18      PT LONG TERM GOAL #4   Title  Pt will be able to return to hitting golf balls without increased neck pain as part of healthy active lifestyle    Time  6    Period  Weeks    Status  New    Target Date  02/03/18      PT LONG TERM GOAL #5   Title  ...    Baseline  ...             Plan - 12/24/17 1335    Clinical Impression Statement  Pt presents to skilled PT due to neck pain on right side.   Pt demonstrates decreased cervical ROM. She has muscle spasms as listed above.  She has cervical muscle weakness along with posture abnormalities as detailed above.  She had positive result from distraction.  Pt will benefit from skilled PT to address these impairments and return to maximum functional and recreational activities.    History and Personal Factors relevant to plan of care:  chronic pain, arthritis    Clinical Presentation  Stable    Clinical Presentation due to:  pt is stable    Clinical Decision Making  Low    Rehab Potential  Excellent    PT Frequency  2x / week    PT Duration  6 weeks    PT Treatment/Interventions  ADLs/Self Care Home Management;Cryotherapy;Electrical Stimulation;Moist Heat;Traction;Therapeutic activities;Therapeutic exercise;Neuromuscular re-education;Patient/family education;Manual techniques;Dry needling;Taping;Passive range of motion    PT Next Visit Plan  DN to cervical multifidi, thoracic mobility, cervical ROM, posture    PT Home Exercise Plan   Access Code: [email or web address]     Consulted and Agree with Plan of Care  Patient       Patient will benefit from skilled therapeutic intervention in order to improve the following deficits and impairments:  Pain, Increased fascial restricitons, Increased muscle spasms, Postural dysfunction, Decreased strength, Decreased range of motion, Hypomobility  Visit Diagnosis: Other muscle spasm - Plan: PT plan of care cert/re-cert  Muscle weakness (generalized) - Plan: PT plan of care cert/re-cert  Abnormal posture - Plan: PT plan of care cert/re-cert     Problem List Patient Active Problem List   Diagnosis Date Noted  . Hyperlipidemia 10/21/2014  . Chest pain with moderate risk for cardiac etiology 10/20/2014  . Cervical stenosis of spinal canal 03/10/2014  . TOBACCO DEPENDENCE 07/02/2006  . RESTLESS LEGS SYNDROME 07/02/2006  . MITRAL VALVE PROLAPSE 07/02/2006  . MENOPAUSAL SYNDROME 07/02/2006  . DJD, UNSPECIFIED  07/02/2006  . DISC WITH RADICULOPATHY 07/02/2006    Zannie Cove, PT 12/24/2017, 2:27 PM  [facility] [address], [place], [place], [address] Phone: [phone]   Fax:  [phone]  Name: Marilyn Perez MRN: [id number] Date of Birth: July 31, 1950

## 2017-12-28 ENCOUNTER — Encounter: Payer: Self-pay | Admitting: Physical Therapy

## 2017-12-28 ENCOUNTER — Ambulatory Visit: Payer: Medicare Other | Admitting: Physical Therapy

## 2017-12-28 DIAGNOSIS — M5442 Lumbago with sciatica, left side: Secondary | ICD-10-CM

## 2017-12-28 DIAGNOSIS — R293 Abnormal posture: Secondary | ICD-10-CM | POA: Diagnosis not present

## 2017-12-28 DIAGNOSIS — M62838 Other muscle spasm: Secondary | ICD-10-CM

## 2017-12-28 DIAGNOSIS — M6281 Muscle weakness (generalized): Secondary | ICD-10-CM

## 2017-12-28 DIAGNOSIS — R300 Dysuria: Secondary | ICD-10-CM | POA: Diagnosis not present

## 2017-12-28 NOTE — Therapy (Signed)
[facility] [address] [place], [place], [address] Phone: [phone]   Fax:  [phone]  Physical Therapy Treatment  Patient Details  Name: Marilyn Perez MRN: [id number] Date of Birth: February 24, 1951 Referring Provider: Traci Sermon, PA-C   Encounter Date: 12/28/2017  PT End of Session - 12/28/17 0842    Visit Number  2    Date for PT Re-Evaluation  02/03/18    Authorization Type  medicare    PT Start Time  0800    PT Stop Time  0840    PT Time Calculation (min)  40 min    Activity Tolerance  Patient tolerated treatment well;No increased pain    Behavior During Therapy  WFL for tasks assessed/performed       Past Medical History:  Diagnosis Date  . Anxiety   . Arthritis    ra  . Hyperlipidemia 10/21/2014  . Osteopenia     Past Surgical History:  Procedure Laterality Date  . ABDOMINAL HYSTERECTOMY    . ANTERIOR CERVICAL DECOMP/DISCECTOMY FUSION N/A 03/10/2014   Procedure: Cervical five-six Anterior Cervical Discectomy with  Fusion and Plating ;  Surgeon: Faythe Ghee, MD;  Location: [place] [facility];  Service: Neurosurgery;  Laterality: N/A;  . BACK SURGERY    . BREAST EXCISIONAL BIOPSY    . CARPAL TUNNEL RELEASE    . CESAREAN SECTION     x 3  . EYE SURGERY      There were no vitals filed for this visit.  Subjective Assessment - 12/28/17 0801    Subjective  Pt reports that things are going well. The dry needling helped alot. No pain currently.    Pertinent History  arthritis    Diagnostic tests  MRI, CT scan, x-rays    Patient Stated Goals  get rid of the tingling and be able to return to normal walking    Currently in Pain?  No/denies    Pain Onset  More than a month ago                       [facility] Adult PT Treatment/Exercise - 12/28/17 0001      Exercises   Exercises  Neck      Neck Exercises: Machines for Strengthening   UBE (Upper Arm Bike)  L1 x4 min forward,  PT present to discuss posture      Neck Exercises: Seated   Cervical Isometrics  Right lateral flexion;Left lateral flexion;Right rotation;Left rotation;5 secs;Flexion    Cervical Isometrics Limitations  flexion standing with ball against wall       Neck Exercises: Supine   Other Supine Exercise  horizontal abduction with red TB x15 reps       Neck Exercises: Sidelying   Other Sidelying Exercise  thoracic rotation Lt and Rt x15 reps each      Manual Therapy   Manual Therapy  Myofascial release    Soft tissue mobilization  soft tissue mobilization to Rt sub occipitals    Myofascial Release  Trigger point release Rt levator scap              PT Education - 12/28/17 0841    Education Details  updates to HEP    Methods  Explanation;Demonstration;Verbal cues;Handout    Comprehension  Verbalized understanding;Returned demonstration          PT Long Term Goals - 12/28/17 0849      PT LONG TERM GOAL #1  Title  Pt will be ind with HEP    Time  6    Period  Weeks    Status  Achieved      PT LONG TERM GOAL #2   Title  Pt will have 60% reduction in cervical pain when driving and walking    Time  6    Period  Weeks    Status  New      PT LONG TERM GOAL #3   Title  Pt will have Rt cervical rotation 60 degrees or more for improved ability to drive    Time  6    Period  Weeks    Status  New      PT LONG TERM GOAL #4   Title  Pt will be able to return to hitting golf balls without increased neck pain as part of healthy active lifestyle    Time  6    Period  Weeks    Status  New      PT LONG TERM GOAL #5   Title  ...    Baseline  ...            Plan - 12/28/17 0846    Clinical Impression Statement  Pt presents today without pain, reporting minimal issues following her dry needling last session. She does have palpable trigger points in the Rt sub occipitals and Rt levator scap which the therapist addressed with manual release to the area. Also updates pt's HEP  to further improve thoracic mobility and scapular strength. Will plan to complete dry needling next session to further address soft tissue restrictions.     Rehab Potential  Excellent    PT Frequency  2x / week    PT Duration  6 weeks    PT Treatment/Interventions  ADLs/Self Care Home Management;Cryotherapy;Electrical Stimulation;Moist Heat;Traction;Therapeutic activities;Therapeutic exercise;Neuromuscular re-education;Patient/family education;Manual techniques;Dry needling;Taping;Passive range of motion    PT Next Visit Plan  DN to cervical multifidi/levator scap/upper traps, thoracic mobility, cervical ROM, posture    PT Home Exercise Plan   Access Code: [email or web address]     Consulted and Agree with Plan of Care  Patient       Patient will benefit from skilled therapeutic intervention in order to improve the following deficits and impairments:  Pain, Increased fascial restricitons, Increased muscle spasms, Postural dysfunction, Decreased strength, Decreased range of motion, Hypomobility  Visit Diagnosis: Other muscle spasm  Muscle weakness (generalized)  Abnormal posture  Acute bilateral low back pain with left-sided sciatica     Problem List Patient Active Problem List   Diagnosis Date Noted  . Hyperlipidemia 10/21/2014  . Chest pain with moderate risk for cardiac etiology 10/20/2014  . Cervical stenosis of spinal canal 03/10/2014  . TOBACCO DEPENDENCE 07/02/2006  . RESTLESS LEGS SYNDROME 07/02/2006  . MITRAL VALVE PROLAPSE 07/02/2006  . MENOPAUSAL SYNDROME 07/02/2006  . DJD, UNSPECIFIED 07/02/2006  . DISC WITH RADICULOPATHY 07/02/2006    8:51 AM,12/28/17 Sherol Dade PT, DPT [place] [facility] [address] [place], [place], [address] Phone: [phone]   Fax:  [phone]  Name: Marilyn Perez MRN: [id number] Date of Birth: 05/13/1950

## 2017-12-28 NOTE — Patient Instructions (Signed)
Access Code: [email or web address]  URL: [email or web address]  Date: 12/28/2017  Prepared by: Elly Modena   Exercises  Seated Cervical Rotation AROM - 10 reps - 1 sets - 1x daily - 7x weekly  Seated Cervical Flexion AROM - 10 reps - 1 sets - 1x daily - 7x weekly  Seated Cervical Sidebending Stretch - 10 reps - 1 sets - 1x daily - 7x weekly  Sidelying Thoracic Lumbar Rotation - 15 reps - 1 sets - 1x daily - 7x weekly  Supine Shoulder Horizontal Abduction with Resistance - 10-15 reps - 2 sets - 1x daily - 7x weekly  Patient Education  Office Posture   8:41 AM,12/28/17 Marilyn Perez PT, [place] [facility] [place]

## 2017-12-30 ENCOUNTER — Encounter: Payer: Self-pay | Admitting: Physical Therapy

## 2017-12-30 ENCOUNTER — Ambulatory Visit: Payer: Medicare Other | Admitting: Physical Therapy

## 2017-12-30 DIAGNOSIS — M6281 Muscle weakness (generalized): Secondary | ICD-10-CM

## 2017-12-30 DIAGNOSIS — R293 Abnormal posture: Secondary | ICD-10-CM | POA: Diagnosis not present

## 2017-12-30 DIAGNOSIS — M5442 Lumbago with sciatica, left side: Secondary | ICD-10-CM

## 2017-12-30 DIAGNOSIS — M62838 Other muscle spasm: Secondary | ICD-10-CM

## 2017-12-30 NOTE — Therapy (Signed)
[facility] [address], [place], [place], [address] Phone: [phone]   Fax:  [phone]  Physical Therapy Treatment  Patient Details  Name: Marilyn Perez MRN: [id number] Date of Birth: [birth year] Referring Provider: Traci Sermon, PA-C   Encounter Date: 12/30/2017  PT End of Session - 12/30/17 0804    Visit Number  3    Date for PT Re-Evaluation  02/03/18    Authorization Type  medicare    PT Start Time  0800    PT Stop Time  1324    PT Time Calculation (min)  44 min    Activity Tolerance  Patient tolerated treatment well;No increased pain    Behavior During Therapy  WFL for tasks assessed/performed       Past Medical History:  Diagnosis Date  . Anxiety   . Arthritis    ra  . Hyperlipidemia 10/21/2014  . Osteopenia     Past Surgical History:  Procedure Laterality Date  . ABDOMINAL HYSTERECTOMY    . ANTERIOR CERVICAL DECOMP/DISCECTOMY FUSION N/A 03/10/2014   Procedure: Cervical five-six Anterior Cervical Discectomy with  Fusion and Plating ;  Surgeon: Faythe Ghee, MD;  Location: [place] [facility];  Service: Neurosurgery;  Laterality: N/A;  . BACK SURGERY    . BREAST EXCISIONAL BIOPSY    . CARPAL TUNNEL RELEASE    . CESAREAN SECTION     x 3  . EYE SURGERY      There were no vitals filed for this visit.  Subjective Assessment - 12/30/17 0803    Subjective  Pt reports that she is doing ok. She has a UTI and didn't get to do her exercises. No neck pain currently.     Pertinent History  arthritis    Diagnostic tests  MRI, CT scan, x-rays    Patient Stated Goals  get rid of the tingling and be able to return to normal walking    Currently in Pain?  No/denies    Pain Onset  More than a month ago                       [facility] Adult PT Treatment/Exercise - 12/30/17 0001      Exercises   Exercises  Shoulder      Neck Exercises: Machines for Strengthening   UBE (Upper Arm  Bike)  L2 x4 min forward, PT present to discuss progress      Shoulder Exercises: Supine   Diagonals  Both;10 reps;Theraband    Theraband Level (Shoulder Diagonals)  Level 1 (Yellow)    Diagonals Limitations  verbal cues for proper technique       Manual Therapy   Manual therapy comments  thoracic rotation stretch x10 reps each direction     Soft tissue mobilization  STM Lt and Rt upper traps     Myofascial Release  Trigger point release Lt upper trap       Trigger Point Dry Needling - 12/30/17 0804    Consent Given?  Yes    Education Handout Provided  No    Muscles Treated Upper Body  Upper trapezius;Suboccipitals muscle group    Upper Trapezius Response  Twitch reponse elicited;Palpable increased muscle length   Lt and Rt          PT Education - 12/30/17 0805    Education Details  technique with therex     Person(s) Educated  Patient    Methods  Explanation;Verbal  cues    Comprehension  Verbalized understanding;Returned demonstration          PT Long Term Goals - 12/28/17 0849      PT LONG TERM GOAL #1   Title  Pt will be ind with HEP    Time  6    Period  Weeks    Status  Achieved      PT LONG TERM GOAL #2   Title  Pt will have 60% reduction in cervical pain when driving and walking    Time  6    Period  Weeks    Status  New      PT LONG TERM GOAL #3   Title  Pt will have Rt cervical rotation 60 degrees or more for improved ability to drive    Time  6    Period  Weeks    Status  New      PT LONG TERM GOAL #4   Title  Pt will be able to return to hitting golf balls without increased neck pain as part of healthy active lifestyle    Time  6    Period  Weeks    Status  New      PT LONG TERM GOAL #5   Title  ...    Baseline  ...            Plan - 12/30/17 0844    Clinical Impression Statement  Pt arrived without pain, however she was unable to complete her HEP over the past 2 days due to an infection. Session focused on therex to improve  scapular strength and thoracic mobility. Pt was agreeable to dry needling and therapist noted multiple twitch responses of the Lt and Rt upper traps this session. Completed soft tissue mobilization to the area and pt reported some muscle soreness end of session. Will continue with current POC.    Rehab Potential  Excellent    PT Frequency  2x / week    PT Duration  6 weeks    PT Treatment/Interventions  ADLs/Self Care Home Management;Cryotherapy;Electrical Stimulation;Moist Heat;Traction;Therapeutic activities;Therapeutic exercise;Neuromuscular re-education;Patient/family education;Manual techniques;Dry needling;Taping;Passive range of motion    PT Next Visit Plan  DN to cervical multifidi/levator scap/upper traps as needed, thoracic mobility, cervical ROM, posture    PT Home Exercise Plan   Access Code: [email or web address]     Consulted and Agree with Plan of Care  Patient       Patient will benefit from skilled therapeutic intervention in order to improve the following deficits and impairments:  Pain, Increased fascial restricitons, Increased muscle spasms, Postural dysfunction, Decreased strength, Decreased range of motion, Hypomobility  Visit Diagnosis: Other muscle spasm  Muscle weakness (generalized)  Abnormal posture  Acute bilateral low back pain with left-sided sciatica     Problem List Patient Active Problem List   Diagnosis Date Noted  . Hyperlipidemia 10/21/2014  . Chest pain with moderate risk for cardiac etiology 10/20/2014  . Cervical stenosis of spinal canal 03/10/2014  . TOBACCO DEPENDENCE 07/02/2006  . RESTLESS LEGS SYNDROME 07/02/2006  . MITRAL VALVE PROLAPSE 07/02/2006  . MENOPAUSAL SYNDROME 07/02/2006  . DJD, UNSPECIFIED 07/02/2006  . DISC WITH RADICULOPATHY 07/02/2006    8:47 AM,12/30/17 Sherol Dade PT, DPT [place] [facility] [address], [place], [place], [address] Phone: [phone]   Fax:  [phone]  Name: Marilyn Perez MRN: [id number] Date of Birth: [birth year]

## 2018-01-07 ENCOUNTER — Ambulatory Visit: Payer: Medicare Other | Attending: Physician Assistant | Admitting: Physical Therapy

## 2018-01-07 ENCOUNTER — Encounter: Payer: Self-pay | Admitting: Physical Therapy

## 2018-01-07 DIAGNOSIS — M62838 Other muscle spasm: Secondary | ICD-10-CM | POA: Insufficient documentation

## 2018-01-07 DIAGNOSIS — M6281 Muscle weakness (generalized): Secondary | ICD-10-CM

## 2018-01-07 DIAGNOSIS — R293 Abnormal posture: Secondary | ICD-10-CM | POA: Diagnosis not present

## 2018-01-07 NOTE — Therapy (Signed)
[facility] [address], [place], [place], [address] Phone: [phone]   Fax:  [phone]  Physical Therapy Treatment  Patient Details  Name: Marilyn Perez MRN: [id number] Date of Birth: Jan 26, 1951 Referring Provider: Traci Sermon, PA-C   Encounter Date: 01/07/2018  PT End of Session - 01/07/18 0850    Visit Number  4    Date for PT Re-Evaluation  02/03/18    Authorization Type  medicare    PT Start Time  0846    PT Stop Time  0928    PT Time Calculation (min)  42 min    Activity Tolerance  Patient tolerated treatment well;No increased pain    Behavior During Therapy  WFL for tasks assessed/performed       Past Medical History:  Diagnosis Date  . Anxiety   . Arthritis    ra  . Hyperlipidemia 10/21/2014  . Osteopenia     Past Surgical History:  Procedure Laterality Date  . ABDOMINAL HYSTERECTOMY    . ANTERIOR CERVICAL DECOMP/DISCECTOMY FUSION N/A 03/10/2014   Procedure: Cervical five-six Anterior Cervical Discectomy with  Fusion and Plating ;  Surgeon: Faythe Ghee, MD;  Location: [place] [facility];  Service: Neurosurgery;  Laterality: N/A;  . BACK SURGERY    . BREAST EXCISIONAL BIOPSY    . CARPAL TUNNEL RELEASE    . CESAREAN SECTION     x 3  . EYE SURGERY      There were no vitals filed for this visit.  Subjective Assessment - 01/07/18 0849    Subjective  Pt states she is not having pain today.  Reports she still has a lot of huge knots in my back and neck.    Currently in Pain?  No/denies                       [facility] Adult PT Treatment/Exercise - 01/07/18 0001      Exercises   Exercises  Shoulder;Neck      Neck Exercises: Seated   Other Seated Exercise  thoracic ext and rotation seated 10 x 5 sec hold      Neck Exercises: Supine   Neck Retraction  10 reps;5 secs    Capital Flexion  20 reps    Other Supine Exercise  shoulder external rotation - yellow band - 10x 5  sec hold      Moist Heat Therapy   Number Minutes Moist Heat  10 Minutes    Moist Heat Location  Cervical   during supine exercises     Manual Therapy   Soft tissue mobilization  upper traps, cervical and thoracic multifidi, upper traps, levators, suboccipitlas       Trigger Point Dry Needling - 01/07/18 0919    Consent Given?  Yes    Muscles Treated Upper Body  Upper trapezius;Suboccipitals muscle group    Upper Trapezius Response  Twitch reponse elicited;Palpable increased muscle length    SubOccipitals Response  Twitch response elicited;Palpable increased muscle length                PT Long Term Goals - 12/28/17 0849      PT LONG TERM GOAL #1   Title  Pt will be ind with HEP    Time  6    Period  Weeks    Status  Achieved      PT LONG TERM GOAL #2   Title  Pt will have 60%  reduction in cervical pain when driving and walking    Time  6    Period  Weeks    Status  New      PT LONG TERM GOAL #3   Title  Pt will have Rt cervical rotation 60 degrees or more for improved ability to drive    Time  6    Period  Weeks    Status  New      PT LONG TERM GOAL #4   Title  Pt will be able to return to hitting golf balls without increased neck pain as part of healthy active lifestyle    Time  6    Period  Weeks    Status  New      PT LONG TERM GOAL #5   Title  ...    Baseline  ...            Plan - 01/07/18 1010    Clinical Impression Statement  Pt has been able to complete her HEP.  Pt did well with exercises today using heat.  She had trigger points especially in upper traps that released with manual techniques and dry needling.  Pt experienced relief with thoracic mobility in sitting exercises and added to HEP.  Pt will benefit from skilled PT to continue working on posture and ROM.    PT Treatment/Interventions  ADLs/Self Care Home Management;Cryotherapy;Electrical Stimulation;Moist Heat;Traction;Therapeutic activities;Therapeutic exercise;Neuromuscular  re-education;Patient/family education;Manual techniques;Dry needling;Taping;Passive range of motion    PT Next Visit Plan  DN to cervical multifidi/levator scap/upper traps as needed, thoracic mobility, cervical ROM, posture    PT Home Exercise Plan   Access Code: [email or web address]     Consulted and Agree with Plan of Care  Patient       Patient will benefit from skilled therapeutic intervention in order to improve the following deficits and impairments:  Pain, Increased fascial restricitons, Increased muscle spasms, Postural dysfunction, Decreased strength, Decreased range of motion, Hypomobility  Visit Diagnosis: Other muscle spasm  Muscle weakness (generalized)  Abnormal posture     Problem List Patient Active Problem List   Diagnosis Date Noted  . Hyperlipidemia 10/21/2014  . Chest pain with moderate risk for cardiac etiology 10/20/2014  . Cervical stenosis of spinal canal 03/10/2014  . TOBACCO DEPENDENCE 07/02/2006  . RESTLESS LEGS SYNDROME 07/02/2006  . MITRAL VALVE PROLAPSE 07/02/2006  . MENOPAUSAL SYNDROME 07/02/2006  . DJD, UNSPECIFIED 07/02/2006  . DISC WITH RADICULOPATHY 07/02/2006    Zannie Cove, PT 01/07/2018, 11:59 AM  [facility] [address] [place], [place], [address] Phone: [phone]   Fax:  [phone]  Name: Marilyn Perez MRN: [id number] Date of Birth: 04/11/1951

## 2018-01-11 ENCOUNTER — Ambulatory Visit: Payer: Medicare Other | Admitting: Physical Therapy

## 2018-01-11 ENCOUNTER — Encounter: Payer: Self-pay | Admitting: Physical Therapy

## 2018-01-11 DIAGNOSIS — M6281 Muscle weakness (generalized): Secondary | ICD-10-CM | POA: Diagnosis not present

## 2018-01-11 DIAGNOSIS — R293 Abnormal posture: Secondary | ICD-10-CM

## 2018-01-11 DIAGNOSIS — M62838 Other muscle spasm: Secondary | ICD-10-CM | POA: Diagnosis not present

## 2018-01-11 NOTE — Therapy (Signed)
[facility] [address], [place], [place], [address] Phone: [phone]   Fax:  [phone]  Physical Therapy Treatment  Patient Details  Name: Marilyn Perez MRN: [id number] Date of Birth: Nov 07, 1950 Referring Provider: Traci Sermon, PA-C   Encounter Date: 01/11/2018  PT End of Session - 01/11/18 0927    Visit Number  5    Date for PT Re-Evaluation  02/03/18    Authorization Type  medicare    PT Start Time  0927    PT Stop Time  3810    PT Time Calculation (min)  48 min    Activity Tolerance  Patient tolerated treatment well;No increased pain    Behavior During Therapy  WFL for tasks assessed/performed       Past Medical History:  Diagnosis Date  . Anxiety   . Arthritis    ra  . Hyperlipidemia 10/21/2014  . Osteopenia     Past Surgical History:  Procedure Laterality Date  . ABDOMINAL HYSTERECTOMY    . ANTERIOR CERVICAL DECOMP/DISCECTOMY FUSION N/A 03/10/2014   Procedure: Cervical five-six Anterior Cervical Discectomy with  Fusion and Plating ;  Surgeon: Faythe Ghee, MD;  Location: [place] [facility];  Service: Neurosurgery;  Laterality: N/A;  . BACK SURGERY    . BREAST EXCISIONAL BIOPSY    . CARPAL TUNNEL RELEASE    . CESAREAN SECTION     x 3  . EYE SURGERY      There were no vitals filed for this visit.  Subjective Assessment - 01/11/18 0933    Subjective  Pt states she is a little sore today, but was doing some stretches and doesn't feel as bad now. States she feels it more on the right side.    Currently in Pain?  No/denies                       [facility] Adult PT Treatment/Exercise - 01/11/18 0001      Exercises   Exercises  Shoulder;Neck      Neck Exercises: Machines for Strengthening   UBE (Upper Arm Bike)  L2 x4 min forward, PT present to discuss progress      Shoulder Exercises: Seated   Extension  Strengthening;Both;20 reps;Theraband    Theraband Level  (Shoulder Extension)  Level 1 (Yellow)    Row  Strengthening;Both;20 reps;Theraband    Theraband Level (Shoulder Row)  Level 1 (Yellow)    Horizontal ABduction  Strengthening;Both;20 reps;Theraband    Theraband Level (Shoulder Horizontal ABduction)  Level 1 (Yellow)    External Rotation  Strengthening;Both;20 reps;Theraband    Theraband Level (Shoulder External Rotation)  Level 1 (Yellow)      Moist Heat Therapy   Number Minutes Moist Heat  14 Minutes    Moist Heat Location  Cervical      Manual Therapy   Soft tissue mobilization  cervical paraspinal, suboccipitals       Trigger Point Dry Needling - 01/11/18 1006    Consent Given?  Yes    Muscles Treated Upper Body  Suboccipitals muscle group   cervical C3-4 multifidi   SubOccipitals Response  Twitch response elicited;Palpable increased muscle length           PT Education - 01/11/18 1009    Education Details   Access Code: [email or web address]     Person(s) Educated  Patient    Methods  Explanation;Demonstration;Handout    Comprehension  Verbalized understanding;Returned demonstration  PT Long Term Goals - 01/11/18 0949      PT LONG TERM GOAL #1   Title  Pt will be ind with HEP    Status  Achieved      PT LONG TERM GOAL #2   Title  Pt will have 60% reduction in cervical pain when driving and walking    Baseline  can walk without neck pain    Status  Partially Met      PT LONG TERM GOAL #3   Title  Pt will have Rt cervical rotation 60 degrees or more for improved ability to drive    Baseline  70 to Rt; 50 to Lt            Plan - 01/11/18 1020    Clinical Impression Statement  Pt demonstrates improved ROM and can walk now without neck pain showing progress towards her goals.  Pt responded well to manual and dry needling with reduced soreness and muscle spasm.  Pt was able to progress strengthening exercises.  She needs cues for decreasing forward head posutre.  She will continue to benefit from skilled PT to  progress towards functional goals.    Rehab Potential  Excellent    PT Treatment/Interventions  ADLs/Self Care Home Management;Cryotherapy;Electrical Stimulation;Moist Heat;Traction;Therapeutic activities;Therapeutic exercise;Neuromuscular re-education;Patient/family education;Manual techniques;Dry needling;Taping;Passive range of motion    PT Next Visit Plan  DN as needed to cervical multifidi/levator scap/upper traps for improved ROM, thoracic mobility, cervical ROM, posture, progress strength as tolerated    PT Home Exercise Plan   Access Code: [email or web address]     Consulted and Agree with Plan of Care  Patient       Patient will benefit from skilled therapeutic intervention in order to improve the following deficits and impairments:  Pain, Increased fascial restricitons, Increased muscle spasms, Postural dysfunction, Decreased strength, Decreased range of motion, Hypomobility  Visit Diagnosis: Other muscle spasm  Muscle weakness (generalized)  Abnormal posture     Problem List Patient Active Problem List   Diagnosis Date Noted  . Hyperlipidemia 10/21/2014  . Chest pain with moderate risk for cardiac etiology 10/20/2014  . Cervical stenosis of spinal canal 03/10/2014  . TOBACCO DEPENDENCE 07/02/2006  . RESTLESS LEGS SYNDROME 07/02/2006  . MITRAL VALVE PROLAPSE 07/02/2006  . MENOPAUSAL SYNDROME 07/02/2006  . DJD, UNSPECIFIED 07/02/2006  . DISC WITH RADICULOPATHY 07/02/2006    Zannie Cove, PT 01/11/2018, 10:24 AM  [facility] [address] [place], [place], [address] Phone: [phone]   Fax:  [phone]  Name: Marilyn Perez MRN: [id number] Date of Birth: 10-Nov-1950

## 2018-01-11 NOTE — Patient Instructions (Signed)
Access Code: [email or web address]  URL: [email or web address]  Date: 01/11/2018  Prepared by: Lovett Calender   Exercises  Seated Cervical Rotation AROM - 10 reps - 1 sets - 1x daily - 7x weekly  Seated Cervical Flexion AROM - 10 reps - 1 sets - 1x daily - 7x weekly  Seated Cervical Sidebending Stretch - 10 reps - 1 sets - 1x daily - 7x weekly  Sidelying Thoracic Lumbar Rotation - 15 reps - 1 sets - 1x daily - 7x weekly  Supine Shoulder Horizontal Abduction with Resistance - 10-15 reps - 2 sets - 1x daily - 7x weekly  Seated Thoracic Lumbar Extension - 10 reps - 1 sets - 5 sec hold - 1x daily - 7x weekly  Shoulder Extension with Resistance - 10 reps - 3 sets - 1x daily - 7x weekly  Standing Bilateral Low Shoulder Row with Anchored Resistance - 10 reps - 3 sets - 1x daily - 7x weekly  Patient Education  Office Posture

## 2018-01-14 ENCOUNTER — Encounter: Payer: Self-pay | Admitting: Physical Therapy

## 2018-01-14 ENCOUNTER — Ambulatory Visit: Payer: Medicare Other | Admitting: Physical Therapy

## 2018-01-14 DIAGNOSIS — M62838 Other muscle spasm: Secondary | ICD-10-CM

## 2018-01-14 DIAGNOSIS — M6281 Muscle weakness (generalized): Secondary | ICD-10-CM | POA: Diagnosis not present

## 2018-01-14 DIAGNOSIS — R293 Abnormal posture: Secondary | ICD-10-CM | POA: Diagnosis not present

## 2018-01-14 NOTE — Therapy (Signed)
[facility] [address], [place], [place], [address] Phone: [phone]   Fax:  [phone]  Physical Therapy Treatment  Patient Details  Name: Marilyn Perez MRN: [id number] Date of Birth: [birth year] Referring Provider: Traci Sermon, PA-C   Encounter Date: 01/14/2018  PT End of Session - 01/14/18 0812    Visit Number  6    Date for PT Re-Evaluation  02/03/18    Authorization Type  medicare    PT Start Time  0801    PT Stop Time  0841    PT Time Calculation (min)  40 min    Activity Tolerance  Patient tolerated treatment well;No increased pain    Behavior During Therapy  WFL for tasks assessed/performed       Past Medical History:  Diagnosis Date  . Anxiety   . Arthritis    ra  . Hyperlipidemia 10/21/2014  . Osteopenia     Past Surgical History:  Procedure Laterality Date  . ABDOMINAL HYSTERECTOMY    . ANTERIOR CERVICAL DECOMP/DISCECTOMY FUSION N/A 03/10/2014   Procedure: Cervical five-six Anterior Cervical Discectomy with  Fusion and Plating ;  Surgeon: Faythe Ghee, MD;  Location: [place] [facility];  Service: Neurosurgery;  Laterality: N/A;  . BACK SURGERY    . BREAST EXCISIONAL BIOPSY    . CARPAL TUNNEL RELEASE    . CESAREAN SECTION     x 3  . EYE SURGERY      There were no vitals filed for this visit.  Subjective Assessment - 01/14/18 0803    Subjective  Pt states that she is doing great today. She has no neck pain currently. She feels atleast 90% improved overall but still thinks she has knots in her upper back.     Currently in Pain?  No/denies         [facility] PT Assessment - 01/14/18 0001      AROM   Cervical - Right Side Bend  40    Cervical - Left Side Bend  40    Cervical - Right Rotation  70    Cervical - Left Rotation  65                   [facility] Adult PT Treatment/Exercise - 01/14/18 0001      Exercises   Exercises  Shoulder      Neck Exercises: Machines  for Strengthening   UBE (Upper Arm Bike)  L3 x6 min forward, Pt present to discuss progress       Neck Exercises: Supine   Neck Retraction  10 reps;5 secs;Limitations    Neck Retraction Limitations  press into green physioball       Shoulder Exercises: Seated   Diagonals  Both;Strengthening;10 reps    Theraband Level (Shoulder Diagonals)  Level 1 (Yellow)      Shoulder Exercises: Sidelying   External Rotation  Both;Strengthening;10 reps    External Rotation Weight (lbs)  2    Flexion  Both;Strengthening;10 reps    Flexion Weight (lbs)  2    Other Sidelying Exercises  horizontal abduction Lt and Rt with 2# dubbell x10 reps each       Manual Therapy   Soft tissue mobilization  STM upper thoracic paraspinals and rhomboids, upper traps        Trigger Point Dry Needling - 01/14/18 0851    Consent Given?  Yes    Upper Trapezius Response  Twitch reponse elicited;Palpable increased muscle  length   Lt          PT Education - 01/14/18 0828    Education Details  technique with therex    Person(s) Educated  Patient    Methods  Explanation;Verbal cues;Tactile cues    Comprehension  Verbalized understanding;Returned demonstration          PT Long Term Goals - 01/14/18 0845      PT LONG TERM GOAL #1   Title  Pt will be ind with HEP    Status  Achieved      PT LONG TERM GOAL #2   Title  Pt will have 60% reduction in cervical pain when driving and walking    Baseline  90% improved    Status  Achieved      PT LONG TERM GOAL #3   Title  Pt will have Rt cervical rotation 60 degrees or more for improved ability to drive    Baseline  70 to Rt; 65 to Lt    Status  Achieved            Plan - 01/14/18 0844    Clinical Impression Statement  Pt is making steady progress towards her goals. She feels atleast 90% improved overall with her primary complaint being knots in her upper back. Today focused on therex to further progress posterior shoulder strength. Pt did have fatigue  with this but was able to demonstrate proper technique. Completed dry needling and soft tissue massage to the upper traps and rhomboids which pt reports decreased tightness end of session. Will continue with current POC.     Rehab Potential  Excellent    PT Treatment/Interventions  ADLs/Self Care Home Management;Cryotherapy;Electrical Stimulation;Moist Heat;Traction;Therapeutic activities;Therapeutic exercise;Neuromuscular re-education;Patient/family education;Manual techniques;Dry needling;Taping;Passive range of motion    PT Next Visit Plan  thoracic mobility, cervical ROM, posture, progress strength as tolerated    PT Home Exercise Plan   Access Code: [email or web address]     Consulted and Agree with Plan of Care  Patient       Patient will benefit from skilled therapeutic intervention in order to improve the following deficits and impairments:  Pain, Increased fascial restricitons, Increased muscle spasms, Postural dysfunction, Decreased strength, Decreased range of motion, Hypomobility  Visit Diagnosis: Other muscle spasm  Muscle weakness (generalized)  Abnormal posture     Problem List Patient Active Problem List   Diagnosis Date Noted  . Hyperlipidemia 10/21/2014  . Chest pain with moderate risk for cardiac etiology 10/20/2014  . Cervical stenosis of spinal canal 03/10/2014  . TOBACCO DEPENDENCE 07/02/2006  . RESTLESS LEGS SYNDROME 07/02/2006  . MITRAL VALVE PROLAPSE 07/02/2006  . MENOPAUSAL SYNDROME 07/02/2006  . DJD, UNSPECIFIED 07/02/2006  . DISC WITH RADICULOPATHY 07/02/2006   8:52 AM,01/14/18 Sherol Dade PT, DPT [facility] [address] [place], [place], [address] Phone: [phone]   Fax:  [phone]  Name: Marilyn Perez MRN: [id number] Date of Birth: Mar 18, 1951

## 2018-01-20 ENCOUNTER — Ambulatory Visit: Payer: Medicare Other

## 2018-01-20 DIAGNOSIS — M62838 Other muscle spasm: Secondary | ICD-10-CM

## 2018-01-20 DIAGNOSIS — M6281 Muscle weakness (generalized): Secondary | ICD-10-CM

## 2018-01-20 DIAGNOSIS — R293 Abnormal posture: Secondary | ICD-10-CM | POA: Diagnosis not present

## 2018-01-20 NOTE — Therapy (Addendum)
[facility] [address], [place], [place], [address] Phone: [phone]   Fax:  [phone]  Physical Therapy Treatment  Patient Details  Name: Marilyn Perez MRN: [id number] Date of Birth: 1950-12-20 Referring Provider: Traci Sermon, PA-C   Encounter Date: 01/20/2018  PT End of Session - 01/20/18 0929    Visit Number  7    PT Start Time  0846    PT Stop Time  0928    PT Time Calculation (min)  42 min    Activity Tolerance  Patient tolerated treatment well;No increased pain    Behavior During Therapy  WFL for tasks assessed/performed       Past Medical History:  Diagnosis Date  . Anxiety   . Arthritis    ra  . Hyperlipidemia 10/21/2014  . Osteopenia     Past Surgical History:  Procedure Laterality Date  . ABDOMINAL HYSTERECTOMY    . ANTERIOR CERVICAL DECOMP/DISCECTOMY FUSION N/A 03/10/2014   Procedure: Cervical five-six Anterior Cervical Discectomy with  Fusion and Plating ;  Surgeon: Faythe Ghee, MD;  Location: [place] [facility];  Service: Neurosurgery;  Laterality: N/A;  . BACK SURGERY    . BREAST EXCISIONAL BIOPSY    . CARPAL TUNNEL RELEASE    . CESAREAN SECTION     x 3  . EYE SURGERY      There were no vitals filed for this visit.  Subjective Assessment - 01/20/18 0846    Subjective  I am doing so much better.  My RA is flared up right now so everything hurts.      Patient Stated Goals  get rid of the tingling and be able to return to normal walking    Currently in Pain?  No/denies         [facility] PT Assessment - 01/20/18 0001      Assessment   Medical Diagnosis  M54.2 (ICD-10-CM) - Cervicalgia      Prior Function   Leisure  walking, golf      Cognition   Overall Cognitive Status  Within Functional Limits for tasks assessed      Observation/Other Assessments   Focus on Therapeutic Outcomes (FOTO)   30% limitation      AROM   Cervical - Right Side Bend  50    Cervical -  Left Side Bend  50    Cervical - Right Rotation  75    Cervical - Left Rotation  70                   [facility] Adult PT Treatment/Exercise - 01/20/18 0001      Neck Exercises: Machines for Strengthening   UBE (Upper Arm Bike)  L3 x6 min forward, Pt present to discuss progress       Shoulder Exercises: Seated   Horizontal ABduction  Strengthening;Both;20 reps;Theraband    Theraband Level (Shoulder Horizontal ABduction)  Level 3 (Green)    Diagonals  Both;Strengthening;10 reps    Theraband Level (Shoulder Diagonals)  Level 1 (Yellow)      Manual Therapy   Soft tissue mobilization  soft tissue elongation and trigger point release to bil cervical paraspinals and upper traps with passive stretch       Trigger Point Dry Needling - 01/20/18 0929    Consent Given?  Yes    Muscles Treated Upper Body  Upper trapezius   bil cervical multifidi   Upper Trapezius Response  Twitch reponse elicited;Palpable increased  muscle length                PT Long Term Goals - 01/20/18 0902      PT LONG TERM GOAL #1   Title  Pt will be ind with HEP    Status  Achieved      PT LONG TERM GOAL #2   Title  Pt will have 60% reduction in cervical pain when driving and walking    Baseline  90% improved    Status  Achieved      PT LONG TERM GOAL #3   Title  Pt will have Rt cervical rotation 60 degrees or more for improved ability to drive    Baseline  75 to Rt and Lt    Status  Achieved      PT LONG TERM GOAL #4   Title  Pt will be able to return to hitting golf balls without increased neck pain as part of healthy active lifestyle    Baseline  RA flare up so hasn't tried    Status  Partially Met            Plan - 01/20/18 0933    Clinical Impression Statement  Pt with overall pain today due to RA flare up.  Pt will receive injection for RA tomorrow. Pt with pain overall improved by 90%  since the start of care.  Pt with tension and reduced mobility in the cervical spine and  upper thoracic spine and demonstrated improved tissue mobility and improved cervical A/ROM s/p dry needling today. FOTO is improved from 40% limitation to 30% limitation. PT provided tactile and verbal cues for seated exercise today to reduce scapular elevation.  Pt is ready for D/C and will discharge to HEP today.      PT Next Visit Plan  D/C PT to HEP today    PT Home Exercise Plan   Access Code: [email or web address]     Recommended Other Services  initial cert is signed    Consulted and Agree with Plan of Care  Patient       Patient will benefit from skilled therapeutic intervention in order to improve the following deficits and impairments:     Visit Diagnosis: Other muscle spasm  Muscle weakness (generalized)  Abnormal posture     Problem List Patient Active Problem List   Diagnosis Date Noted  . Hyperlipidemia 10/21/2014  . Chest pain with moderate risk for cardiac etiology 10/20/2014  . Cervical stenosis of spinal canal 03/10/2014  . TOBACCO DEPENDENCE 07/02/2006  . RESTLESS LEGS SYNDROME 07/02/2006  . MITRAL VALVE PROLAPSE 07/02/2006  . MENOPAUSAL SYNDROME 07/02/2006  . DJD, UNSPECIFIED 07/02/2006  . [place] WITH RADICULOPATHY 07/02/2006   PHYSICAL THERAPY DISCHARGE SUMMARY  Visits from Start of Care: 7  Current functional level related to goals / functional outcomes: See above for current status   Remaining deficits: No functional deficits remain related to neck pain   Education / Equipment: HEP, Posture Plan: Patient agrees to discharge.  Patient goals were met. Patient is being discharged due to meeting the stated rehab goals.  ?????         Marilyn Perez, PT 01/20/18 9:34 AM  [facility] [address] [place], [place], [address] Phone: [phone]   Fax:  [phone]  Name: Marilyn Perez MRN: [id number] Date of Birth: 01/14/1951

## 2018-01-21 DIAGNOSIS — M0609 Rheumatoid arthritis without rheumatoid factor, multiple sites: Secondary | ICD-10-CM | POA: Diagnosis not present

## 2018-02-18 DIAGNOSIS — M0609 Rheumatoid arthritis without rheumatoid factor, multiple sites: Secondary | ICD-10-CM | POA: Diagnosis not present

## 2018-03-25 DIAGNOSIS — M0609 Rheumatoid arthritis without rheumatoid factor, multiple sites: Secondary | ICD-10-CM | POA: Diagnosis not present

## 2018-03-25 DIAGNOSIS — M159 Polyosteoarthritis, unspecified: Secondary | ICD-10-CM | POA: Diagnosis not present

## 2018-03-25 DIAGNOSIS — Z79899 Other long term (current) drug therapy: Secondary | ICD-10-CM | POA: Diagnosis not present

## 2018-03-25 DIAGNOSIS — E663 Overweight: Secondary | ICD-10-CM | POA: Diagnosis not present

## 2018-03-25 DIAGNOSIS — Z6828 Body mass index (BMI) 28.0-28.9, adult: Secondary | ICD-10-CM | POA: Diagnosis not present

## 2018-03-25 DIAGNOSIS — M353 Polymyalgia rheumatica: Secondary | ICD-10-CM | POA: Diagnosis not present

## 2018-03-25 DIAGNOSIS — M2550 Pain in unspecified joint: Secondary | ICD-10-CM | POA: Diagnosis not present

## 2018-04-09 ENCOUNTER — Encounter: Payer: Self-pay | Admitting: Podiatry

## 2018-04-09 ENCOUNTER — Ambulatory Visit (INDEPENDENT_AMBULATORY_CARE_PROVIDER_SITE_OTHER): Payer: Medicare Other

## 2018-04-09 ENCOUNTER — Ambulatory Visit (INDEPENDENT_AMBULATORY_CARE_PROVIDER_SITE_OTHER): Payer: Medicare Other | Admitting: Podiatry

## 2018-04-09 DIAGNOSIS — M7741 Metatarsalgia, right foot: Secondary | ICD-10-CM | POA: Diagnosis not present

## 2018-04-09 DIAGNOSIS — M7742 Metatarsalgia, left foot: Secondary | ICD-10-CM | POA: Diagnosis not present

## 2018-04-09 DIAGNOSIS — M779 Enthesopathy, unspecified: Secondary | ICD-10-CM

## 2018-04-09 DIAGNOSIS — M1990 Unspecified osteoarthritis, unspecified site: Secondary | ICD-10-CM | POA: Diagnosis not present

## 2018-04-09 NOTE — Progress Notes (Signed)
Subjective:    Patient ID: Marilyn Perez, female    DOB: 01/30/1951, 67 y.o.   MRN: [id number]  HPI   67 year old female presents the office today for concerns of pain to both of her feet the right side worse than the left.  She points along submetatarsal 2 and the second interspace area which is the majority of tenderness.  She does have a history of rheumatoid arthritis and she is referred by her rheumatologist, Dr. Berna Bue.  She states is been on the last 6 weeks or more.  Is been a chronic issue is been worse over this timeframe.  She states the pain is worse with walking.  She has been seen by Dr. Paulla Dolly previously for her foot pain.  She has no other concerns today.  After discussion with her we discussed treatment options we talked about different inserts for her shoes.  She has been seen by our practice several times before and she was given orthotics which made her foot pain worse.  She does not want to proceed with orthotics or any insert.    Review of Systems  All other systems reviewed and are negative.  Past Medical History:  Diagnosis Date  . Anxiety   . Arthritis    ra  . Hyperlipidemia 10/21/2014  . Osteopenia     Past Surgical History:  Procedure Laterality Date  . ABDOMINAL HYSTERECTOMY    . ANTERIOR CERVICAL DECOMP/DISCECTOMY FUSION N/A 03/10/2014   Procedure: Cervical five-six Anterior Cervical Discectomy with  Fusion and Plating ;  Surgeon: Faythe Ghee, MD;  Location: [facility];  Service: Neurosurgery;  Laterality: N/A;  . BACK SURGERY    . BREAST EXCISIONAL BIOPSY    . CARPAL TUNNEL RELEASE    . CESAREAN SECTION     x 3  . EYE SURGERY       Current Outpatient Medications:  .  Cyanocobalamin (VITAMIN B-12 IJ), Inject as directed every 30 (thirty) days., Disp: , Rfl:  .  diazepam (VALIUM) 10 MG tablet, Take 1 tablet (10 mg total) by mouth every 8 (eight) hours as needed for muscle spasms., Disp: 30 tablet, Rfl: 0 .  DULoxetine (CYMBALTA)  60 MG capsule, Take 60 mg by mouth daily., Disp: , Rfl:  .  leflunomide (ARAVA) 20 MG tablet, Take 20 mg by mouth daily., Disp: , Rfl:  .  Misc Natural Products (MIDNITE PO), Take 2 tablets by mouth at bedtime., Disp: , Rfl:  .  oxyCODONE-acetaminophen (PERCOCET/ROXICET) 5-325 MG tablet, Take 1 tablet by mouth every 4 (four) hours as needed for severe pain., Disp: 20 tablet, Rfl: 0 .  predniSONE (DELTASONE) 10 MG tablet, Take q day 6,5,4,3,2,1, Disp: 21 tablet, Rfl: 0 .  rOPINIRole (REQUIP) 0.5 MG tablet, Take 1 mg by mouth at bedtime., Disp: , Rfl:   Allergies  Allergen Reactions  . Penicillins Swelling and Rash    "facial swelling"  . Remicade [Infliximab] Swelling and Rash  . Sulfa Antibiotics Rash        Objective:   Physical Exam  General: AAO x3, NAD- presents wearing a "Bob" style shoe.   Dermatological: Skin is warm, dry and supple bilateral. Nails x 10 are well manicured; remaining integument appears unremarkable at this time. There are no open sores, no preulcerative lesions, no rash or signs of infection present.  Vascular: Dorsalis Pedis artery and Posterior Tibial artery pedal pulses are 2/4 bilateral with immedate capillary fill time. There is no pain with calf  compression, swelling, warmth, erythema.   Neruologic: Grossly intact via light touch bilateral. . Protective threshold with Semmes Wienstein monofilament intact to all pedal sites bilateral.   Musculoskeletal: There is tenderness to bilateral foot with the right side worse than the left.  There is tenderness most on the second interspace in the right foot as well as the second MPJ.  There is localized edema but there is no erythema or warmth.  There is no specific area pinpoint bony tenderness.  Dorsal spurring present the left midfoot.  No other areas of tenderness but unable to elicit today.  Prominence of the metatarsal heads plantarly.  Muscular strength 5/5 in all groups tested bilateral.  Gait: Unassisted,  Nonantalgic.     Assessment & Plan:  67 year old female with capsulitis, tendinitis bilaterally with rheumatoid arthritis -Treatment options discussed including all alternatives, risks, and complications -Etiology of symptoms were discussed -X-rays were obtained and reviewed with the patient.  There is no definitive evidence of acute fracture or stress fracture identified today.  Status post first metatarsal cuneiform joint fusion on the left foot.  Arthritic changes present in the midfoot as well. -Steroid injections performed bilaterally.  See procedure note below. -I dispensed a gel metatarsal offloading pads. -She had a difficult time finding shoes that are comfortable.  Given the arthritis ideally a stiffer soled shoe might be more beneficial for her we can also may be a graphite insert inside the shoe if needed.  Given her history of rheumatoid arthritis is very difficult to treat the generalized foot pain but will continue to work on this.  Hopefully the acute pain and swelling will be improved with the injections.  Procedure: Injection Tendon/Ligament Discussed alternatives, risks, complications and verbal consent was obtained.  Location: Bilateral second interspace Skin Prep: Alcohol. Injectate: 0.5cc 0.5% marcaine plain, 0.5 cc 2% lidocaine plain and, 1 cc kenalog 10. Disposition: Patient tolerated procedure well. Injection site dressed with a band-aid.  Post-injection care was discussed and return precautions discussed.    Trula Slade DPM

## 2018-04-30 ENCOUNTER — Ambulatory Visit (INDEPENDENT_AMBULATORY_CARE_PROVIDER_SITE_OTHER): Payer: Medicare Other | Admitting: Podiatry

## 2018-04-30 ENCOUNTER — Encounter: Payer: Self-pay | Admitting: Podiatry

## 2018-04-30 ENCOUNTER — Telehealth: Payer: Self-pay | Admitting: *Deleted

## 2018-04-30 DIAGNOSIS — D3610 Benign neoplasm of peripheral nerves and autonomic nervous system, unspecified: Secondary | ICD-10-CM | POA: Diagnosis not present

## 2018-04-30 DIAGNOSIS — M779 Enthesopathy, unspecified: Secondary | ICD-10-CM

## 2018-04-30 DIAGNOSIS — M1990 Unspecified osteoarthritis, unspecified site: Secondary | ICD-10-CM

## 2018-04-30 NOTE — Telephone Encounter (Signed)
Faxed orders to [facility]. 

## 2018-04-30 NOTE — Telephone Encounter (Signed)
-----   Message from Trula Slade, DPM sent at 04/30/2018 10:15 AM EST ----- Can you please order diagnostic ultrasounds of bilateral feet to look for a neuroma in the 2nd interspace? If we have to choose sides because of insurance the right side hurts more than the left. Thanks.

## 2018-05-04 NOTE — Progress Notes (Signed)
Subjective: 67 year old female presents the office today for follow-up evaluation of bilateral foot pain with the right side worse than left.  She states the injections helped for about a day.  She states that she still gets pain on almost a daily basis but is intermittent.  She states the gel cushions helps some but she cannot wear them in certain shoes.  She denies any recent injury or trauma any changes since I last saw her. Denies any systemic complaints such as fevers, chills, nausea, vomiting. No acute changes since last appointment, and no other complaints at this time.   Objective: AAO x3, NAD DP/PT pulses palpable bilaterally, CRT less than 3 seconds There is continuation of tenderness palpation on the second interspaces bilaterally with the right side worse than left.  Minimal swelling to the area.  Not able to palpate any neuroma however she does get burning pain to the area as well.  There is no area pinpoint tenderness identified today.  Overall exam is unchanged.  No open lesions or pre-ulcerative lesions.  No pain with calf compression, swelling, warmth, erythema  Assessment: Bilateral second interspace pain, rule out neuroma.   Plan: -All treatment options discussed with the patient including all alternatives, risks, complications.  -Although the injections did provide very short-term relief she did not get long-term improvement from this.  She is also attempted numerous conservative treatments but any significant improvement.  Will order ultrasounds bilaterally to evaluate for possible neuroma.  Also asking to mark areas that are more painful prior to the ultrasound. -Patient encouraged to call the office with any questions, concerns, change in symptoms.   Trula Slade DPM

## 2018-05-07 DIAGNOSIS — Z79899 Other long term (current) drug therapy: Secondary | ICD-10-CM | POA: Diagnosis not present

## 2018-05-07 DIAGNOSIS — M0609 Rheumatoid arthritis without rheumatoid factor, multiple sites: Secondary | ICD-10-CM | POA: Diagnosis not present

## 2018-05-14 DIAGNOSIS — E538 Deficiency of other specified B group vitamins: Secondary | ICD-10-CM | POA: Diagnosis not present

## 2018-05-18 ENCOUNTER — Encounter: Payer: Self-pay | Admitting: Podiatry

## 2018-05-18 ENCOUNTER — Ambulatory Visit
Admission: RE | Admit: 2018-05-18 | Discharge: 2018-05-18 | Disposition: A | Discharge status: Home / Self Care | Payer: Medicare Other | Source: Ambulatory Visit | Attending: Podiatry | Admitting: Podiatry

## 2018-05-18 ENCOUNTER — Other Ambulatory Visit: Payer: Self-pay | Admitting: Podiatry

## 2018-05-18 DIAGNOSIS — M779 Enthesopathy, unspecified: Secondary | ICD-10-CM

## 2018-05-18 DIAGNOSIS — D3610 Benign neoplasm of peripheral nerves and autonomic nervous system, unspecified: Secondary | ICD-10-CM

## 2018-05-18 DIAGNOSIS — M1990 Unspecified osteoarthritis, unspecified site: Secondary | ICD-10-CM

## 2018-05-18 DIAGNOSIS — M79671 Pain in right foot: Secondary | ICD-10-CM | POA: Diagnosis not present

## 2018-05-18 IMAGING — US US EXTREM LOW BILAT COMP
1 series · 13 of 22 positions shown · non-contrast
Comparison: None.

CLINICAL DATA: Bilateral plantar foot pain.

EXAM:
BILATERAL LOWER EXTREMITY SOFT TISSUE ULTRASOUND COMPLETE
TECHNIQUE: Ultrasound examination was performed including evaluation of the
muscles, tendons, joint, and adjacent soft tissues.

[Series 1: us extrem low bilat comp · 0.07mm/px · 22 acquisitions, 13 frames shown]
[im 1/22]
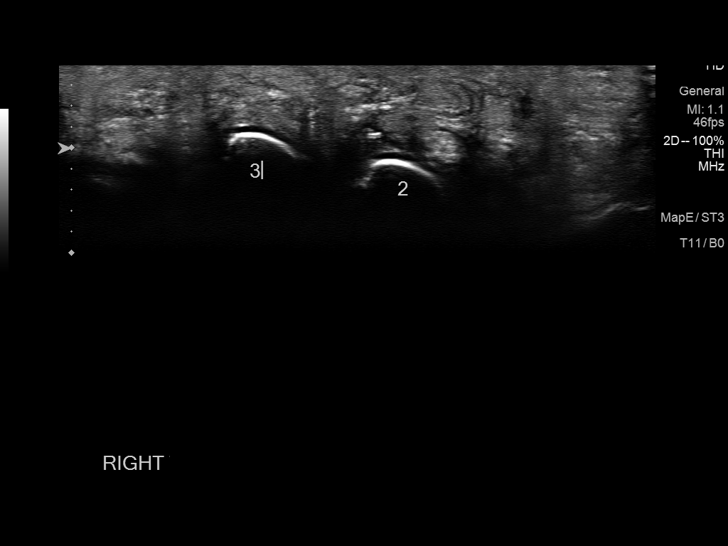
[im 3/22]
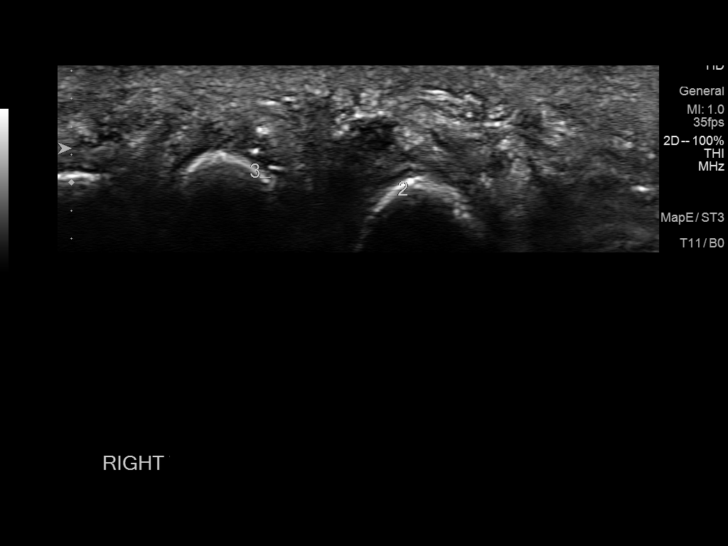
[im 5/22]
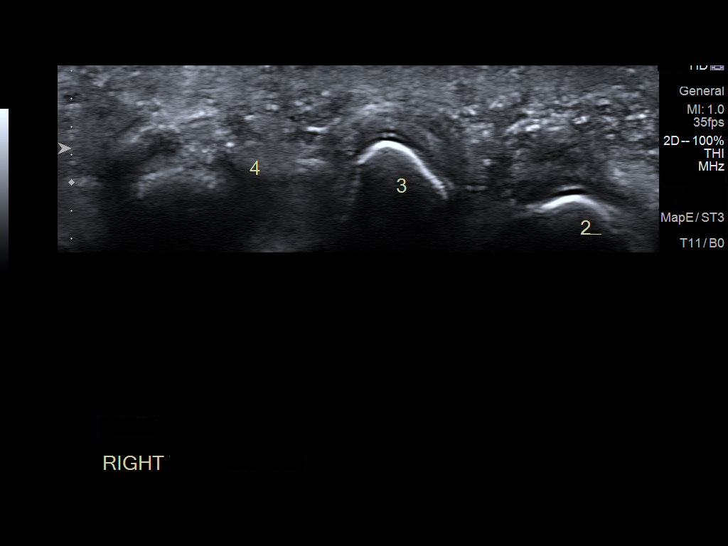
[im 6/22]
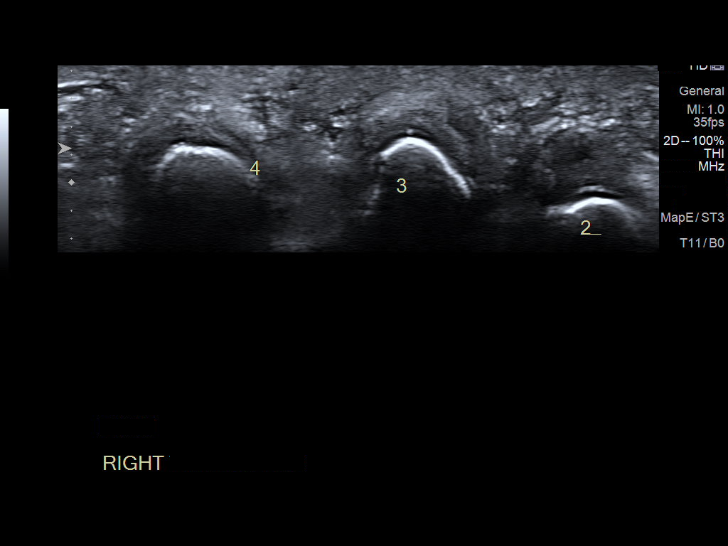
[im 8/22]
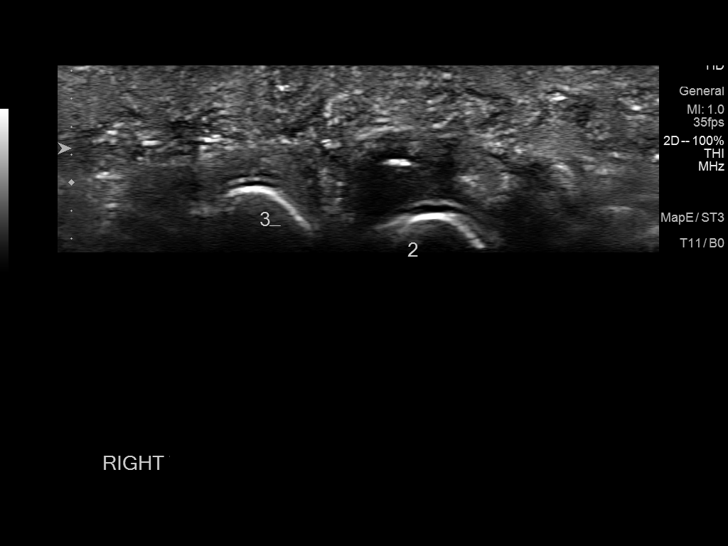
[im 10/22]
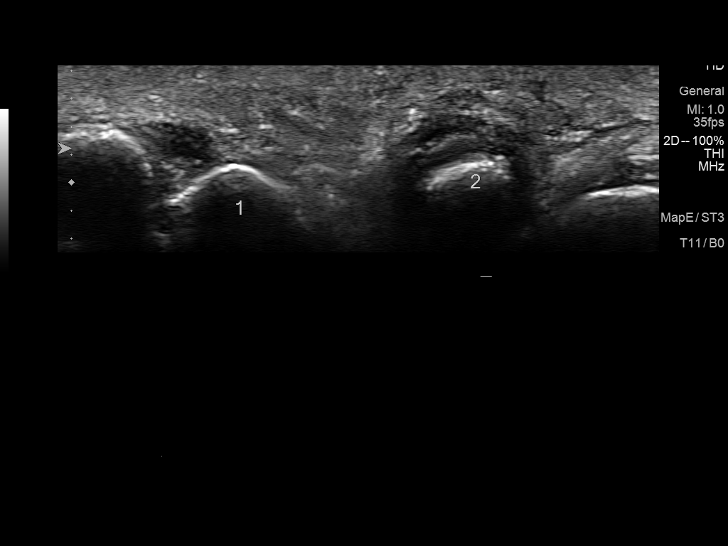
[im 12/22]
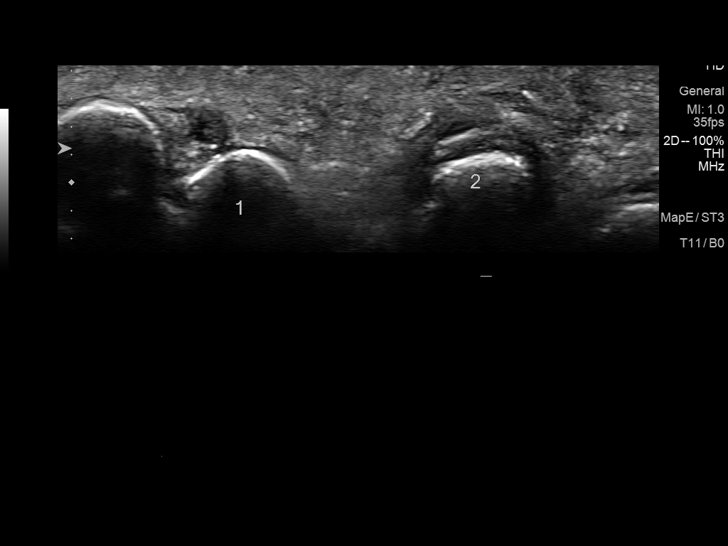
[im 13/22]
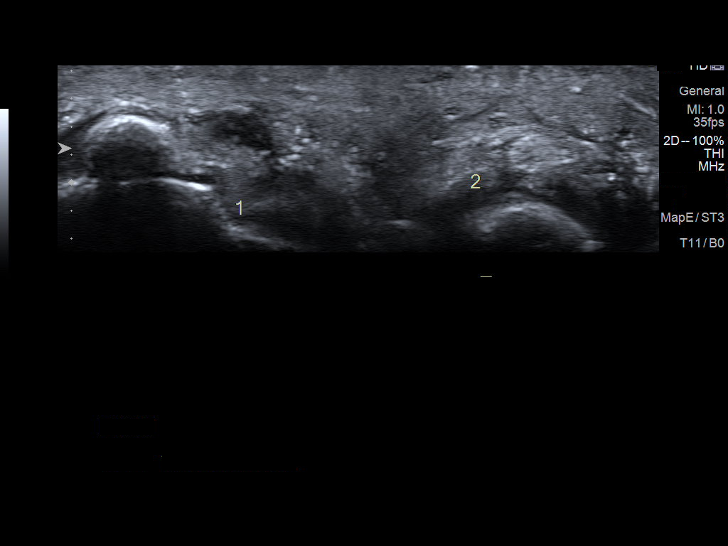
[im 15/22]
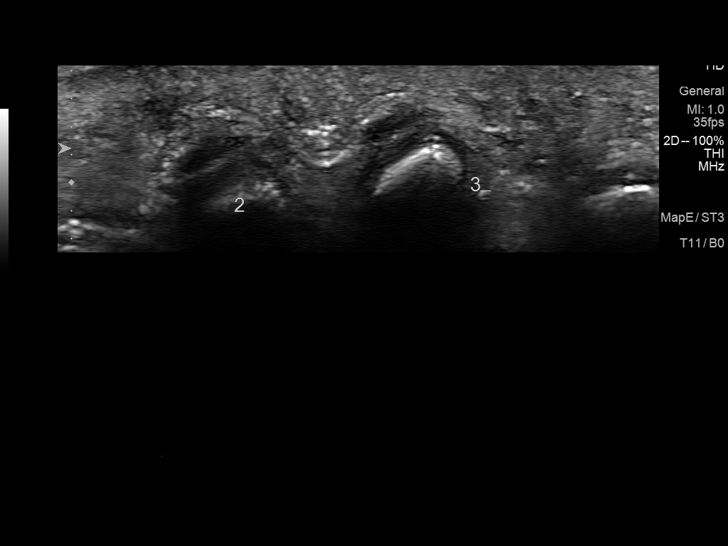
[im 17/22]
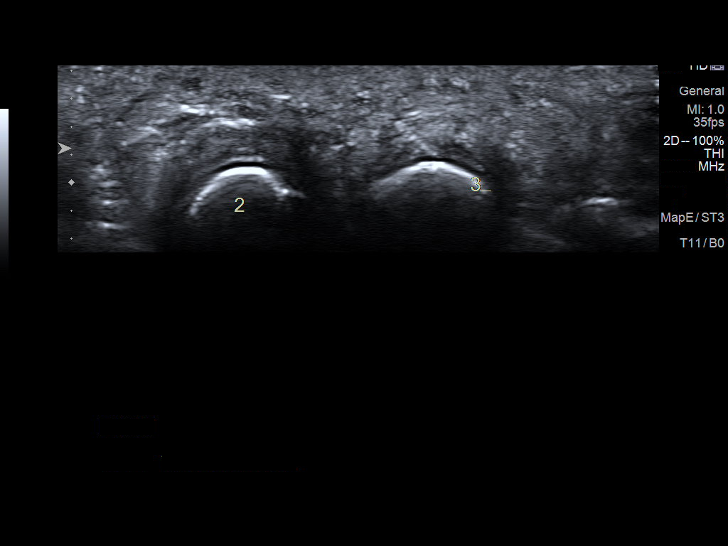
[im 18/22]
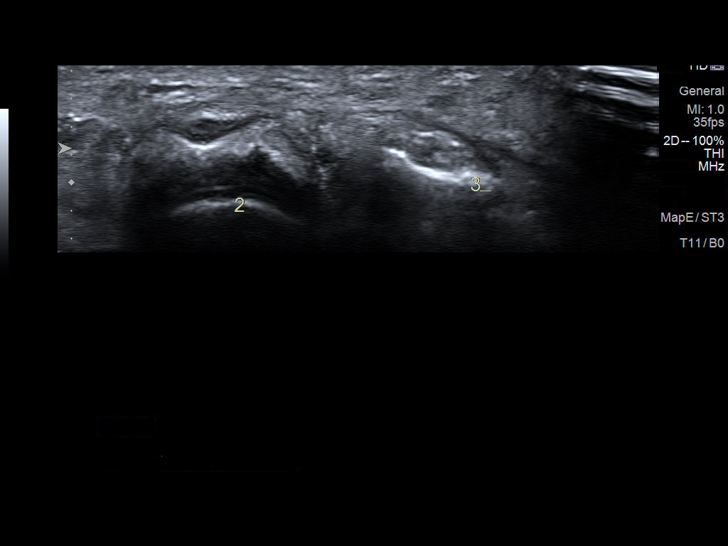
[im 20/22]
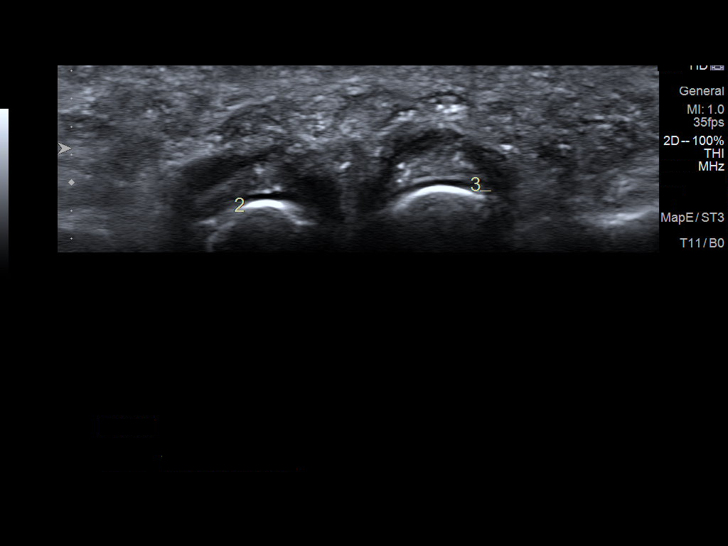
[im 22/22]
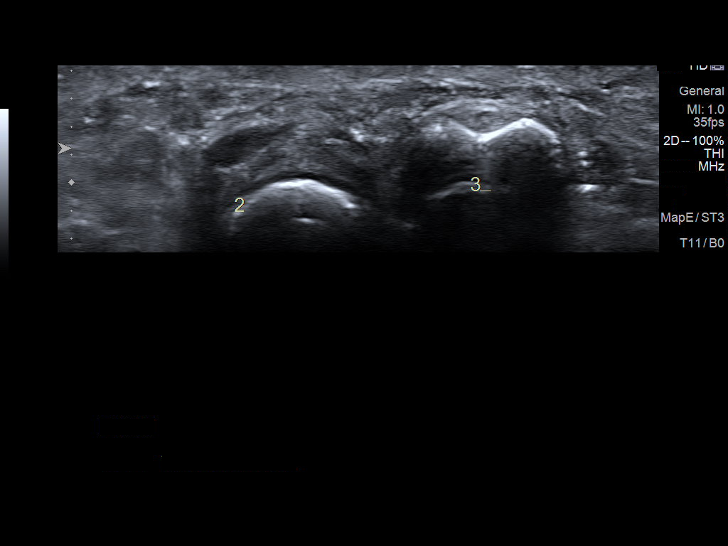

[13 of 22 positions shown; findings below may reference images not displayed]

FINDINGS: I performed realtime ultrasound with the 18 megahertz transducer. I
examined the ball of the foot with and without transverse
compression at the level of the head of the metatarsals. I also
performed imaging with dorsal compression between the heads of the
second and third metatarsals while imaging the plantar aspect of the
foot.

Right foot: There is evidence of abnormal hypoechoic soft tissue
measuring approximately 4 x 6 mm between the heads of the second and
third metatarsals. There is no similar abnormality between the third
and fourth and first and second metatarsal heads.

Left foot: There is a 4 x 6 mm area of hypo echogenic soft tissue
between the heads of the second and third metatarsals similar to
that seen on the right. This is not as well visualized as on the
right side.
IMPRESSION: Ultrasound findings consistent with bilateral JOHN INGAR neuromas
between the heads of the second and third metatarsals.

## 2018-05-18 NOTE — Progress Notes (Signed)
I called the patient to go over the ultrasound results.  Ultrasound did confirm neuromas on both feet.  After discussion she wants to have surgery to remove the neuroma and she was to start with the right side.  Paperwork was filled out today and given to [facility] and she will contact the patient to get her scheduled I will see her back prior to surgery for consent.  She had no further questions or concerns.  Trula Slade

## 2018-05-21 ENCOUNTER — Telehealth: Payer: Self-pay | Admitting: *Deleted

## 2018-05-21 NOTE — Telephone Encounter (Signed)
"  Dr. Jacqualyn Posey said you would call me to set up my surgery.  He said I need a Morton's Neuroma surgery on my foot."

## 2018-05-25 NOTE — Telephone Encounter (Signed)
I am returning your call.  You want to schedule your surgery?  "Yes, I would.  I am scheduled to have a Rheumatoid injection in two weeks.  I can't take any antibiotics prior to it.  So it needs to be after that date or before it."  Dr. Jacqualyn Posey can do your surgery on February 12.  "He doesn't have anything before that?"  He does not.  He's not doing surgery the week of February 5.  "Okay, I guess go ahead and put me down for January 12.  I have no other choice."  You need to come in for an appointment to see Dr. Jacqualyn Posey to sign consent forms.  Would you like me to schedule you an appointment?"  Yes, let's go ahead and schedule it."  Do you have a preference in days, he can see you any day except Wednesdays.  "Let's schedule it for a Thursday or Friday."  Dr. Jacqualyn Posey can see you on this Thursday, January 23 at 9:30 am.  "That time will be fine."  I scheduled her an appointment for 05/27/18 at 9:30 am.

## 2018-05-26 DIAGNOSIS — H903 Sensorineural hearing loss, bilateral: Secondary | ICD-10-CM | POA: Diagnosis not present

## 2018-05-27 ENCOUNTER — Encounter: Payer: Self-pay | Admitting: Podiatry

## 2018-05-27 ENCOUNTER — Ambulatory Visit (INDEPENDENT_AMBULATORY_CARE_PROVIDER_SITE_OTHER): Payer: Medicare Other | Admitting: Podiatry

## 2018-05-27 DIAGNOSIS — D3610 Benign neoplasm of peripheral nerves and autonomic nervous system, unspecified: Secondary | ICD-10-CM

## 2018-05-27 DIAGNOSIS — M2041 Other hammer toe(s) (acquired), right foot: Secondary | ICD-10-CM | POA: Diagnosis not present

## 2018-05-27 NOTE — Patient Instructions (Signed)

## 2018-05-27 NOTE — Progress Notes (Signed)
Subjective: 68 year old female presents today for surgical consultation.  She states that she wants to start with surgery on the right side first.  I called her and we had discussed neuromas on both feet.  She was to have surgery in the right side before the left and also the hammertoe on the right second toe has been painful and causing irritation on top of her shoes and she will have this fixed at the same time.  She is tried offloading padding with a significant improvement. Denies any systemic complaints such as fevers, chills, nausea, vomiting. No acute changes since last appointment, and no other complaints at this time.   Objective: AAO x3, NAD DP/PT pulses palpable bilaterally, CRT less than 3 seconds There is continuation of tenderness palpation of the second interspaces bilaterally.  There is no edema, erythema to this area.  Semirigid hammertoe contracture present of the right second toe.  There is erythema to the dorsal aspect of the PIPJ from where it rubs inside of her shoes but there is no skin breakdown or drainage or any other signs of infection.  No open lesions or pre-ulcerative lesions.  No pain with calf compression, swelling, warmth, erythema  Assessment: Bilateral neuromas with right side worse than left, right second digit hammertoe  Plan: -All treatment options discussed with the patient including all alternatives, risks, complications.  -I again reviewed the x-rays with her as well as the ultrasound results.  We had discussed both conservative as well as surgical treatment options.  At this point she wished to proceed with surgical intervention. -We will plan for right foot neuroma excision, hammertoe repair.  She would like to do a screw for fixation as opposed to wire but she understands that if I cannot get a screw to hold then she will have a wire. -The incision placement as well as the postoperative course was discussed with the patient. I discussed risks of the surgery  which include, but not limited to, infection, bleeding, pain, swelling, need for further surgery, delayed or nonhealing, painful or ugly scar, numbness or sensation changes, over/under correction, recurrence, transfer lesions, further deformity, hardware failure, DVT/PE, loss of toe/foot. Patient understands these risks and wishes to proceed with surgery. The surgical consent was reviewed with the patient all 3 pages were signed. No promises or guarantees were given to the outcome of the procedure. All questions were answered to the best of my ability. Before the surgery the patient was encouraged to call the office if there is any further questions. The surgery will be performed at the [facility] on an outpatient basis. -Surgical shoe dispensed for postoperative use. -Patient encouraged to call the office with any questions, concerns, change in symptoms.   Trula Slade DPM

## 2018-06-04 DIAGNOSIS — M0609 Rheumatoid arthritis without rheumatoid factor, multiple sites: Secondary | ICD-10-CM | POA: Diagnosis not present

## 2018-06-15 DIAGNOSIS — E538 Deficiency of other specified B group vitamins: Secondary | ICD-10-CM | POA: Diagnosis not present

## 2018-06-16 ENCOUNTER — Other Ambulatory Visit: Payer: Self-pay | Admitting: Podiatry

## 2018-06-16 ENCOUNTER — Encounter: Payer: Self-pay | Admitting: Podiatry

## 2018-06-16 DIAGNOSIS — Z01818 Encounter for other preprocedural examination: Secondary | ICD-10-CM | POA: Diagnosis not present

## 2018-06-16 DIAGNOSIS — M7751 Other enthesopathy of right foot: Secondary | ICD-10-CM | POA: Diagnosis not present

## 2018-06-16 DIAGNOSIS — G5761 Lesion of plantar nerve, right lower limb: Secondary | ICD-10-CM | POA: Diagnosis not present

## 2018-06-16 DIAGNOSIS — M2041 Other hammer toe(s) (acquired), right foot: Secondary | ICD-10-CM | POA: Diagnosis not present

## 2018-06-16 MED ORDER — ONDANSETRON HCL 4 MG PO TABS: 4.0000 mg | ORAL_TABLET | Freq: Three times a day (TID) | ORAL | 0 refills | Status: DC | PRN

## 2018-06-16 MED ORDER — CLINDAMYCIN HCL 150 MG PO CAPS: 150.0000 mg | ORAL_CAPSULE | Freq: Three times a day (TID) | ORAL | 0 refills | Status: DC

## 2018-06-16 NOTE — Progress Notes (Signed)
Post-operative medications sent to the pharmacy.  She is on Vicodin from Dr. Lenna Gilford that she just had filled. She is going to continue with this but discussed if we need to change to percocet or refill the Vicodin we can.

## 2018-06-18 ENCOUNTER — Telehealth: Payer: Self-pay | Admitting: *Deleted

## 2018-06-18 NOTE — Telephone Encounter (Signed)
Called and spoke with the patient and the patient is doing good and there is no fever or chills and no nausea and took pain medicine at 11:00 am and has been painful some and does burn but patient stated that was to be expected and I stated to call the [place] office at [phone] if any concerns or questions. Lattie Haw

## 2018-06-22 ENCOUNTER — Ambulatory Visit (INDEPENDENT_AMBULATORY_CARE_PROVIDER_SITE_OTHER): Payer: Self-pay | Admitting: Podiatry

## 2018-06-22 ENCOUNTER — Ambulatory Visit (INDEPENDENT_AMBULATORY_CARE_PROVIDER_SITE_OTHER): Payer: Medicare Other

## 2018-06-22 DIAGNOSIS — D3610 Benign neoplasm of peripheral nerves and autonomic nervous system, unspecified: Secondary | ICD-10-CM

## 2018-06-22 DIAGNOSIS — M2041 Other hammer toe(s) (acquired), right foot: Secondary | ICD-10-CM | POA: Diagnosis not present

## 2018-06-22 DIAGNOSIS — Z09 Encounter for follow-up examination after completed treatment for conditions other than malignant neoplasm: Secondary | ICD-10-CM

## 2018-06-23 NOTE — Progress Notes (Signed)
Subjective: Marilyn Perez is a 68 y.o. is seen today in office s/p right 2nd digit hammertoe repair, neurectomy preformed on 06/16/2018.  She states that she has been experiencing burning pain.  She has not taken the Vicodin which helps.  She does not need a refill.  Overall symptoms are improving.. Denies any systemic complaints such as fevers, chills, nausea, vomiting. No calf pain, chest pain, shortness of breath.   Objective: General: No acute distress, AAOx3  DP/PT pulses palpable 2/4, CRT < 3 sec to all digits.  Protective sensation intact. Motor function intact.  RIGHT foot: Incision is well coapted without any evidence of dehiscence with sutures intact. There is no surrounding erythema, ascending cellulitis, fluctuance, crepitus, malodor, drainage/purulence. There is minimal edema around the surgical site. There is mild to moderate pain along the surgical site.  The second toe still in rectus position.  There is ecchymosis to the forefoot. No other areas of tenderness to bilateral lower extremities.  No other open lesions or pre-ulcerative lesions.  No pain with calf compression, swelling, warmth, erythema.   Assessment and Plan:  Status post right foot surgery, doing well with no complications   -Treatment options discussed including all alternatives, risks, and complications -X-rays were obtained and reviewed.  Hardware intact the second PIPJ.  No evidence of acute fracture. -Incisions appear to be healing well.  Antibiotic ointment and a bandage was applied.  Keep the dressing clean, dry, intact. -Remain in surgical shoe. -Ice/elevation -Pain medication as needed. -Monitor for any clinical signs or symptoms of infection and DVT/PE and directed to call the office immediately should any occur or go to the ER. -Follow-up in 10 days for possible suture removal or sooner if any problems arise. In the meantime, encouraged to call the office with any questions, concerns, change in  symptoms.   Celesta Gentile, DPM

## 2018-07-02 ENCOUNTER — Ambulatory Visit (INDEPENDENT_AMBULATORY_CARE_PROVIDER_SITE_OTHER): Payer: Self-pay | Admitting: Podiatry

## 2018-07-02 DIAGNOSIS — Z09 Encounter for follow-up examination after completed treatment for conditions other than malignant neoplasm: Secondary | ICD-10-CM

## 2018-07-02 DIAGNOSIS — D3610 Benign neoplasm of peripheral nerves and autonomic nervous system, unspecified: Secondary | ICD-10-CM

## 2018-07-02 DIAGNOSIS — M2041 Other hammer toe(s) (acquired), right foot: Secondary | ICD-10-CM

## 2018-07-03 NOTE — Progress Notes (Signed)
Subjective: Marilyn Perez is a 68 y.o. is seen today in office s/p right 2nd digit hammertoe repair, neurectomy preformed on 06/16/2018.  Overall she states that she is doing well.  Pain is improved and she is no longer taking her increased pain medicine, Vicodin.  She is not done this for several days.  She still had some tenderness on the interspace and the burning still but overall is improving.  She is still in a surgical shoe which is eager to get out of this.  She states that she may have overdone it last week as she was in the yard working but she was wearing her surgical shoe. Denies any systemic complaints such as fevers, chills, nausea, vomiting. No calf pain, chest pain, shortness of breath.   Objective: General: No acute distress, AAOx3  DP/PT pulses palpable 2/4, CRT < 3 sec to all digits.  Protective sensation intact. Motor function intact.  RIGHT foot: Incision is well coapted without any evidence of dehiscence with sutures intact.  There is a faint rim of erythema along the incision but this is more from inflammation as there is no other signs of infection.  There is no ascending cellulitis there is no drainage or pus.  Incision appears to be healing well.  There is no fluctuation or crepitation.  Mild tenderness palpation still at the surgical sites but overall improving.  The second toe does sit in a slightly dorsiflexed position. No other open lesions or pre-ulcerative lesions.  No pain with calf compression, swelling, warmth, erythema.   Assessment and Plan:  Status post right foot surgery, doing well with no complications   -Treatment options discussed including all alternatives, risks, and complications -Every other suture was removed.  Incision appears to be healing well but given her skin is very thin and has multiple comorbidities I will leave the rest intact.  She can start to shower tomorrow however.  I want her to keep antibiotic ointment and a bandage on the  area. -Splint was dispensed today to help pull the toe down in a rectus position.  I want her to remain in the surgical shoe until next activity. -She is not taking any pain medication at this time.  Continue to ice and elevate. -Follow-up in 1 week to remove the remainder of the sutures or sooner if needed.  Trula Slade DPM

## 2018-07-09 ENCOUNTER — Ambulatory Visit (INDEPENDENT_AMBULATORY_CARE_PROVIDER_SITE_OTHER): Payer: Medicare Other

## 2018-07-09 ENCOUNTER — Ambulatory Visit (INDEPENDENT_AMBULATORY_CARE_PROVIDER_SITE_OTHER): Payer: Self-pay | Admitting: Podiatry

## 2018-07-09 DIAGNOSIS — D3610 Benign neoplasm of peripheral nerves and autonomic nervous system, unspecified: Secondary | ICD-10-CM

## 2018-07-09 DIAGNOSIS — M2041 Other hammer toe(s) (acquired), right foot: Secondary | ICD-10-CM | POA: Diagnosis not present

## 2018-07-09 DIAGNOSIS — Z09 Encounter for follow-up examination after completed treatment for conditions other than malignant neoplasm: Secondary | ICD-10-CM

## 2018-07-12 DIAGNOSIS — M0609 Rheumatoid arthritis without rheumatoid factor, multiple sites: Secondary | ICD-10-CM | POA: Diagnosis not present

## 2018-07-12 NOTE — Progress Notes (Signed)
Subjective: Marilyn Perez is a 68 y.o. is seen today in office s/p right 2nd digit hammertoe repair, neurectomy preformed on 06/16/2018.  Overall she states that she is getting better and her pain is improving.  She is not taking any pain medication at this time.  She still in a surgical shoe.  She presents here for suture removal.  She denies any fevers, chills, nausea, vomiting.  No calf pain, chest pain, shortness of breath.  Objective: General: No acute distress, AAOx3  DP/PT pulses palpable 2/4, CRT < 3 sec to all digits.  Protective sensation intact. Motor function intact.  RIGHT foot: Incision is well coapted without any evidence of dehiscence with the remainder of the sutures intact.  Minimal swelling to the areas.  There is no surrounding erythema, ascending cellulitis.  No fluctuation crepitation any malodor.  Clinically the incisions appear to be doing well.  The toe is much more rectus position since wearing this point.  Decreased tenderness palpation of the surgical sites. No pain with calf compression, swelling, warmth, erythema.   Assessment and Plan:  Status post right foot surgery, doing well with no complications , improving  -Treatment options discussed including all alternatives, risks, and complications -Remainder of the sutures were removed today without complications incisions appear to be doing well.  Steri-Strips were applied for reinforcement followed by antibiotic ointment and a bandage.  She can start to shower in 2 days as long as the incision is healing well.  I want her to keep antibiotic ointment on that as well.  Continue with a Darco splint to help hold the toe in a rectus position.  Continue with surgical shoe for now.  As she starts to improve she can start to transition to regular shoe as she is able to but not at this point of the week.  Monitoring signs or symptoms of infection. -She was asked about surgery for the contralateral extremity.  I want to get her  back into a shoe make sure she is comfortable with good results on the right foot before proceeding with the left foot.  Trula Slade DPM

## 2018-07-14 DIAGNOSIS — M8580 Other specified disorders of bone density and structure, unspecified site: Secondary | ICD-10-CM | POA: Diagnosis not present

## 2018-07-14 DIAGNOSIS — M069 Rheumatoid arthritis, unspecified: Secondary | ICD-10-CM | POA: Diagnosis not present

## 2018-07-14 DIAGNOSIS — Z1389 Encounter for screening for other disorder: Secondary | ICD-10-CM | POA: Diagnosis not present

## 2018-07-14 DIAGNOSIS — E7800 Pure hypercholesterolemia, unspecified: Secondary | ICD-10-CM | POA: Diagnosis not present

## 2018-07-14 DIAGNOSIS — Z0000 Encounter for general adult medical examination without abnormal findings: Secondary | ICD-10-CM | POA: Diagnosis not present

## 2018-07-14 DIAGNOSIS — F324 Major depressive disorder, single episode, in partial remission: Secondary | ICD-10-CM | POA: Diagnosis not present

## 2018-07-14 DIAGNOSIS — Z1211 Encounter for screening for malignant neoplasm of colon: Secondary | ICD-10-CM | POA: Diagnosis not present

## 2018-07-14 DIAGNOSIS — E2839 Other primary ovarian failure: Secondary | ICD-10-CM | POA: Diagnosis not present

## 2018-07-14 DIAGNOSIS — Z23 Encounter for immunization: Secondary | ICD-10-CM | POA: Diagnosis not present

## 2018-07-21 ENCOUNTER — Other Ambulatory Visit: Payer: Self-pay | Admitting: Internal Medicine

## 2018-07-21 DIAGNOSIS — E2839 Other primary ovarian failure: Secondary | ICD-10-CM

## 2018-07-21 DIAGNOSIS — Z1231 Encounter for screening mammogram for malignant neoplasm of breast: Secondary | ICD-10-CM

## 2018-07-22 ENCOUNTER — Other Ambulatory Visit: Payer: Medicare Other

## 2018-08-09 DIAGNOSIS — M0609 Rheumatoid arthritis without rheumatoid factor, multiple sites: Secondary | ICD-10-CM | POA: Diagnosis not present

## 2018-08-09 DIAGNOSIS — Z79899 Other long term (current) drug therapy: Secondary | ICD-10-CM | POA: Diagnosis not present

## 2018-08-19 DIAGNOSIS — R946 Abnormal results of thyroid function studies: Secondary | ICD-10-CM | POA: Diagnosis not present

## 2018-09-01 DIAGNOSIS — R030 Elevated blood-pressure reading, without diagnosis of hypertension: Secondary | ICD-10-CM | POA: Diagnosis not present

## 2018-09-01 DIAGNOSIS — F419 Anxiety disorder, unspecified: Secondary | ICD-10-CM | POA: Diagnosis not present

## 2018-09-01 DIAGNOSIS — M791 Myalgia, unspecified site: Secondary | ICD-10-CM | POA: Diagnosis not present

## 2018-09-01 DIAGNOSIS — R51 Headache: Secondary | ICD-10-CM | POA: Diagnosis not present

## 2018-09-01 DIAGNOSIS — N390 Urinary tract infection, site not specified: Secondary | ICD-10-CM | POA: Diagnosis not present

## 2018-09-07 DIAGNOSIS — R300 Dysuria: Secondary | ICD-10-CM | POA: Diagnosis not present

## 2018-09-13 ENCOUNTER — Other Ambulatory Visit: Payer: Self-pay | Admitting: Internal Medicine

## 2018-09-13 ENCOUNTER — Ambulatory Visit
Admission: RE | Admit: 2018-09-13 | Discharge: 2018-09-13 | Disposition: A | Discharge status: Home / Self Care | Payer: Medicare Other | Source: Ambulatory Visit | Attending: Internal Medicine | Admitting: Internal Medicine

## 2018-09-13 DIAGNOSIS — R1032 Left lower quadrant pain: Secondary | ICD-10-CM

## 2018-09-13 DIAGNOSIS — R946 Abnormal results of thyroid function studies: Secondary | ICD-10-CM | POA: Diagnosis not present

## 2018-09-13 IMAGING — CT CT ABDOMEN AND PELVIS WITH CONTRAST
2 of 5 series · 16 of 46 positions shown, 18 images · IV contrast (iopamidol)
Comparison: CT of the pelvis dated [DATE]

CLINICAL DATA: Left lower quadrant pain.  Nausea.  No fever.

EXAM:
CT ABDOMEN AND PELVIS WITH CONTRAST
TECHNIQUE: Multidetector CT imaging of the abdomen and pelvis was performed
using the standard protocol following bolus administration of
intravenous contrast.
CONTRAST:  100mL [7Q] IOPAMIDOL ([7Q]) INJECTION 61%

[Series 2: abd pelvis 5.00 br40 s3 ax · axial · 0.78mm/px · z∈[+1171,+1526]mm · 13 of 81 slices shown, 15 images]
[im 5/81  soft-tissue]
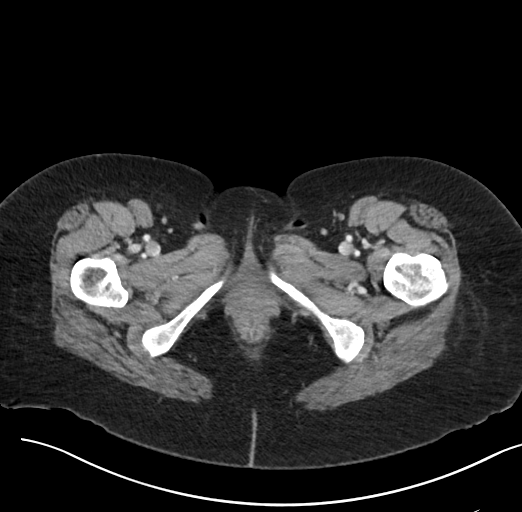
[im 5/81  bone]
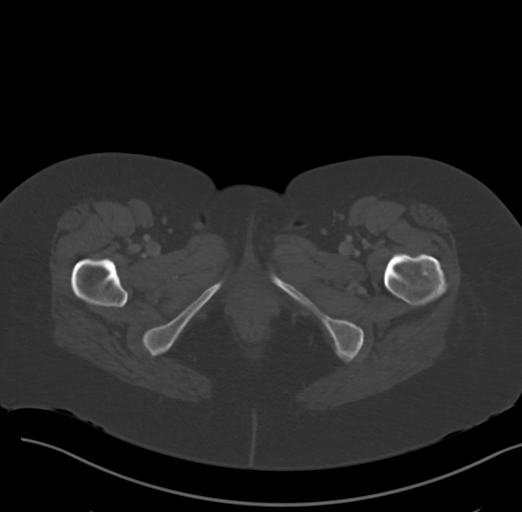
[im 13/81  soft-tissue]
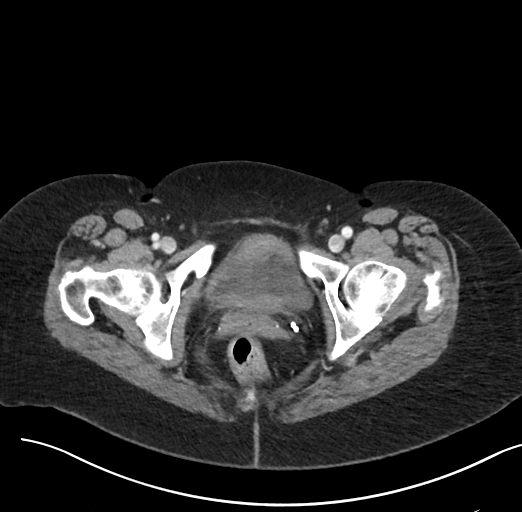
[im 17/81  soft-tissue]
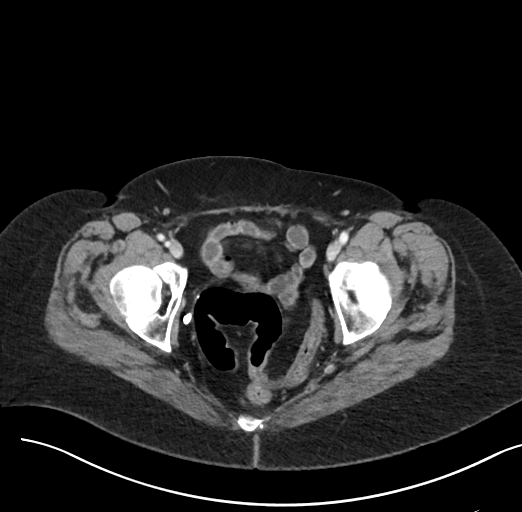
[im 22/81  soft-tissue]
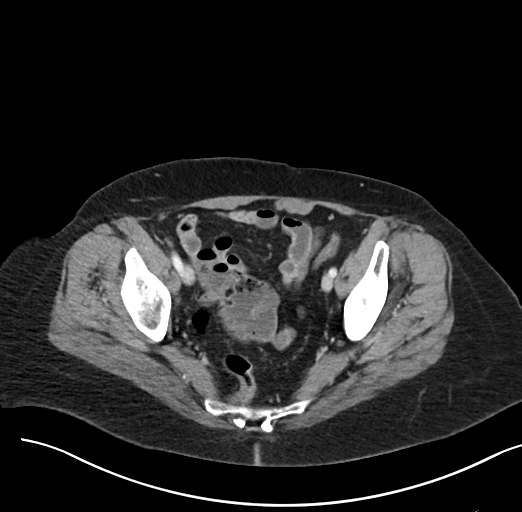
[im 30/81  soft-tissue]
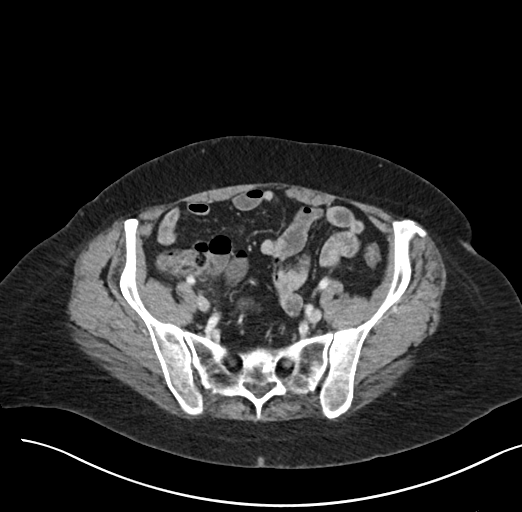
[im 34/81  soft-tissue]
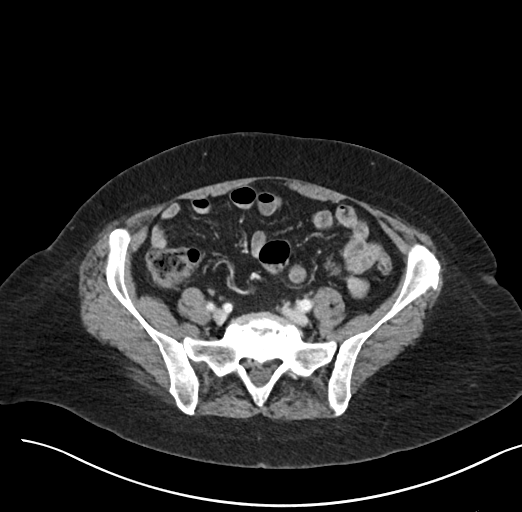
[im 43/81  soft-tissue]
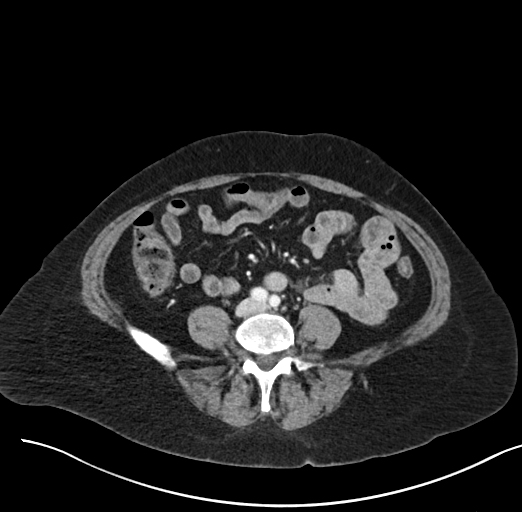
[im 47/81  soft-tissue]
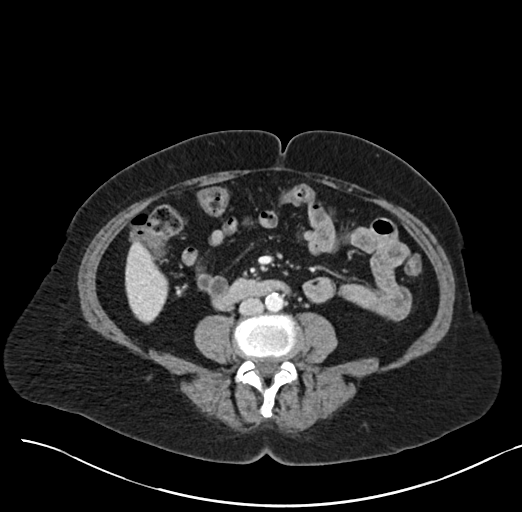
[im 51/81  soft-tissue]
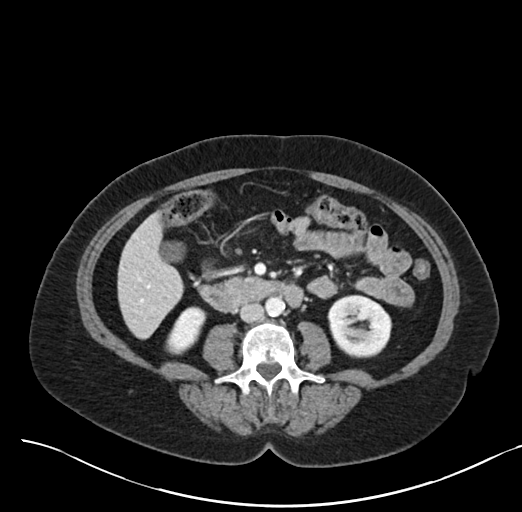
[im 51/81  bone]
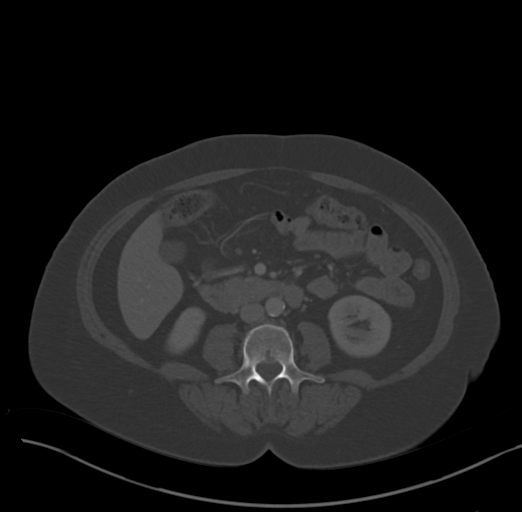
[im 59/81  soft-tissue]
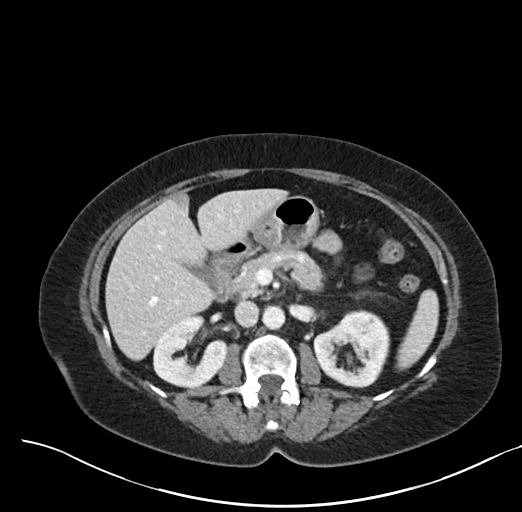
[im 64/81  soft-tissue]
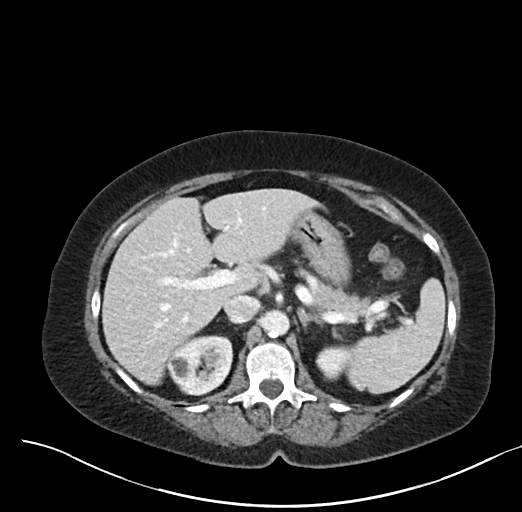
[im 68/81  soft-tissue]
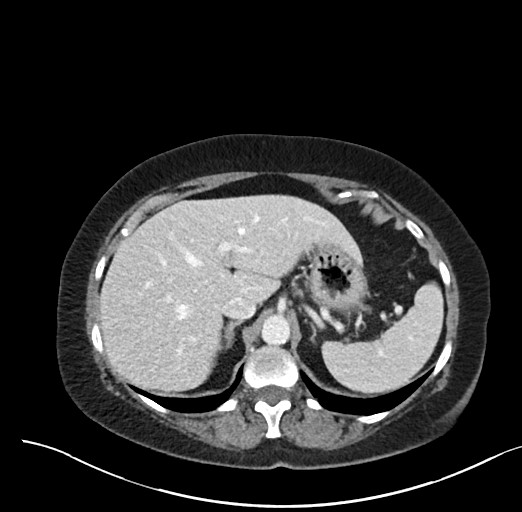
[im 76/81  soft-tissue]
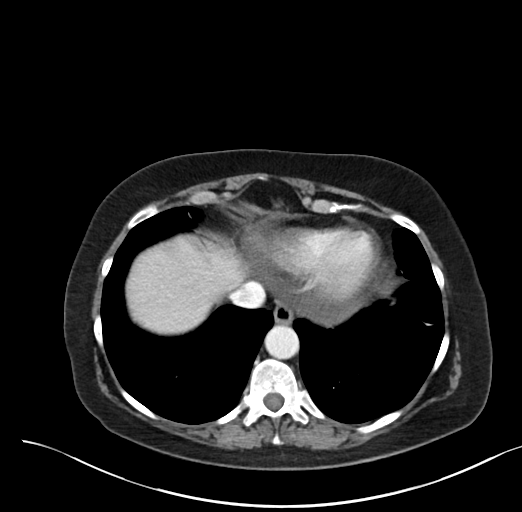

[Series 6: abd pelvis 2.00 br40 s3 cor · coronal · 0.80mm/px · 3 of 133 slices shown]
[im 45/133  soft-tissue]
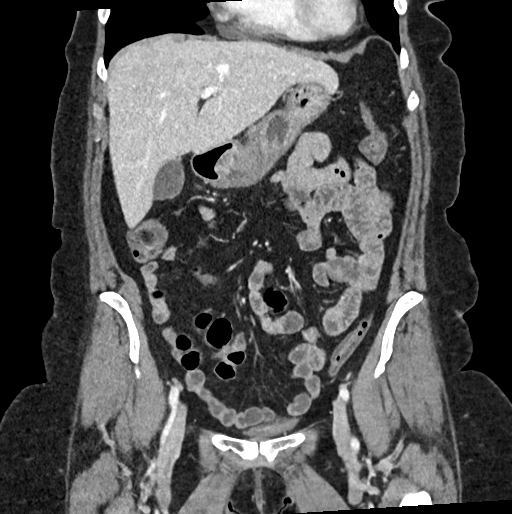
[im 59/133  soft-tissue]
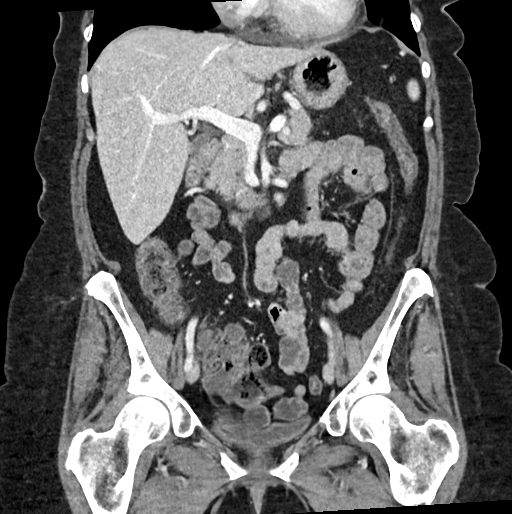
[im 74/133  soft-tissue]
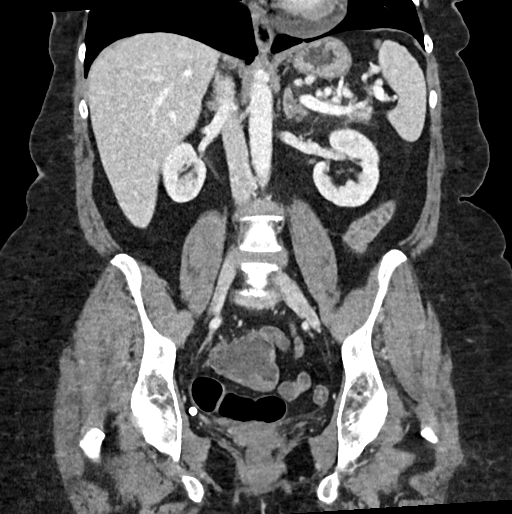

[16 of 46 positions shown; findings below may reference images not displayed]

FINDINGS: Lower chest: There is a small pericardial effusion. There is
scarring at the left lung base.

Hepatobiliary: There is focal fatty infiltration adjacent to the
falciform ligament. There are additional small hypoattenuating areas
in the liver which are too small to characterize but are favored to
represent benign cysts. The gallbladder is unremarkable. There is no
significant biliary ductal dilatation.

Pancreas: There is a small amount of fat stranding about the tail
the pancreas (axial series 2, image 23 and coronal series 6, image
63). There is no distinct pancreatic lesion.

Spleen: Normal in size without focal abnormality.

Adrenals/Urinary Tract: There is a cyst in the upper pole of the
right kidney measuring approximately 1.5 cm. There is no
hydronephrosis. There are no obstructing nephroliths. The bladder is
decompressed which limits evaluation. There is some mild nodularity
of the left adrenal gland. This appears stable since [7Q].

Stomach/Bowel: There is no evidence of diverticulitis. The
descending colon and sigmoid colon are relatively decompressed which
limits evaluation. The appendix is not reliably identified, however
there are no inflammatory changes in the right lower quadrant. There
is some mild wall thickening near the gastric antrum/pylorus status
favored to be secondary to underdistention.

Vascular/Lymphatic: Aortic atherosclerosis. No enlarged abdominal or
pelvic lymph nodes.

Reproductive: Status post hysterectomy. No adnexal masses.

Other: No abdominal wall hernia or abnormality. No abdominopelvic
ascites.

Musculoskeletal: No acute or significant osseous findings.
IMPRESSION: 1. Subtle mild fat stranding about the tail the pancreas could
represent developing pancreatitis. Correlation with physical exam
and lipase is recommended.
2. No hydronephrosis.  No radiopaque kidney stones.
3. No CT evidence of appendicitis or acute cholecystitis.
4. Trace to small pericardial effusion. This is grossly stable since

## 2018-09-13 MED ORDER — IOPAMIDOL (ISOVUE-300) INJECTION 61%
100.0000 mL | Freq: Once | INTRAVENOUS | Status: AC | PRN
  Administered 2018-09-13: 100 mL via INTRAVENOUS

## 2018-09-21 ENCOUNTER — Other Ambulatory Visit: Payer: Self-pay | Admitting: Gastroenterology

## 2018-09-21 DIAGNOSIS — K859 Acute pancreatitis without necrosis or infection, unspecified: Secondary | ICD-10-CM

## 2018-09-21 DIAGNOSIS — R933 Abnormal findings on diagnostic imaging of other parts of digestive tract: Secondary | ICD-10-CM | POA: Diagnosis not present

## 2018-09-21 DIAGNOSIS — M5430 Sciatica, unspecified side: Secondary | ICD-10-CM | POA: Diagnosis not present

## 2018-09-21 DIAGNOSIS — M069 Rheumatoid arthritis, unspecified: Secondary | ICD-10-CM | POA: Diagnosis not present

## 2018-09-21 DIAGNOSIS — E538 Deficiency of other specified B group vitamins: Secondary | ICD-10-CM | POA: Diagnosis not present

## 2018-09-22 DIAGNOSIS — R946 Abnormal results of thyroid function studies: Secondary | ICD-10-CM | POA: Diagnosis not present

## 2018-09-22 DIAGNOSIS — Z79899 Other long term (current) drug therapy: Secondary | ICD-10-CM | POA: Diagnosis not present

## 2018-09-22 DIAGNOSIS — M0609 Rheumatoid arthritis without rheumatoid factor, multiple sites: Secondary | ICD-10-CM | POA: Diagnosis not present

## 2018-09-22 DIAGNOSIS — K859 Acute pancreatitis without necrosis or infection, unspecified: Secondary | ICD-10-CM | POA: Diagnosis not present

## 2018-09-22 DIAGNOSIS — M159 Polyosteoarthritis, unspecified: Secondary | ICD-10-CM | POA: Diagnosis not present

## 2018-09-22 DIAGNOSIS — M2550 Pain in unspecified joint: Secondary | ICD-10-CM | POA: Diagnosis not present

## 2018-10-04 ENCOUNTER — Ambulatory Visit
Admission: RE | Admit: 2018-10-04 | Discharge: 2018-10-04 | Disposition: A | Discharge status: Home / Self Care | Payer: Medicare Other | Source: Ambulatory Visit | Attending: Gastroenterology | Admitting: Gastroenterology

## 2018-10-04 DIAGNOSIS — K824 Cholesterolosis of gallbladder: Secondary | ICD-10-CM | POA: Diagnosis not present

## 2018-10-04 DIAGNOSIS — K859 Acute pancreatitis without necrosis or infection, unspecified: Secondary | ICD-10-CM | POA: Diagnosis not present

## 2018-10-05 ENCOUNTER — Other Ambulatory Visit (HOSPITAL_COMMUNITY): Payer: Self-pay | Admitting: Gastroenterology

## 2018-10-05 ENCOUNTER — Other Ambulatory Visit: Payer: Self-pay | Admitting: Gastroenterology

## 2018-10-05 DIAGNOSIS — M069 Rheumatoid arthritis, unspecified: Secondary | ICD-10-CM | POA: Diagnosis not present

## 2018-10-05 DIAGNOSIS — R1013 Epigastric pain: Secondary | ICD-10-CM | POA: Diagnosis not present

## 2018-10-05 DIAGNOSIS — K859 Acute pancreatitis without necrosis or infection, unspecified: Secondary | ICD-10-CM

## 2018-10-08 ENCOUNTER — Ambulatory Visit (HOSPITAL_COMMUNITY): Payer: Medicare Other

## 2018-10-11 DIAGNOSIS — K859 Acute pancreatitis without necrosis or infection, unspecified: Secondary | ICD-10-CM | POA: Diagnosis not present

## 2018-10-12 ENCOUNTER — Other Ambulatory Visit (HOSPITAL_COMMUNITY): Payer: Self-pay | Admitting: Gastroenterology

## 2018-10-12 DIAGNOSIS — K859 Acute pancreatitis without necrosis or infection, unspecified: Secondary | ICD-10-CM

## 2018-10-13 ENCOUNTER — Ambulatory Visit (HOSPITAL_COMMUNITY)
Admission: RE | Admit: 2018-10-13 | Discharge: 2018-10-13 | Disposition: A | Discharge status: Home / Self Care | Payer: Medicare Other | Source: Ambulatory Visit | Attending: Gastroenterology | Admitting: Gastroenterology

## 2018-10-13 ENCOUNTER — Other Ambulatory Visit: Payer: Self-pay

## 2018-10-13 DIAGNOSIS — K8689 Other specified diseases of pancreas: Secondary | ICD-10-CM | POA: Diagnosis not present

## 2018-10-13 DIAGNOSIS — K859 Acute pancreatitis without necrosis or infection, unspecified: Secondary | ICD-10-CM

## 2018-10-13 DIAGNOSIS — R932 Abnormal findings on diagnostic imaging of liver and biliary tract: Secondary | ICD-10-CM | POA: Diagnosis not present

## 2018-10-13 DIAGNOSIS — N281 Cyst of kidney, acquired: Secondary | ICD-10-CM | POA: Diagnosis not present

## 2018-10-13 LAB — POCT I-STAT CREATININE: Creatinine, Ser: 0.6 mg/dL (ref 0.44–1.00)

## 2018-10-13 IMAGING — MR MR 3D RECON AT [facility]
20 of 22 series · 20 of 22 positions shown · IV contrast (gadavist)
Comparison: Ultrasound [DATE].  CT [DATE].

CLINICAL DATA: Acute pancreatitis.

EXAM:
MRI ABDOMEN WITHOUT AND WITH CONTRAST (INCLUDING MRCP)
TECHNIQUE: Multiplanar multisequence MR imaging of the abdomen was performed
both before and after the administration of intravenous contrast.
Heavily T2-weighted images of the biliary and pancreatic ducts were
obtained, and three-dimensional MRCP images were rendered by post
processing.
CONTRAST:  8 cc Gadavist.

[Series 3: T2 fat-sat · axial · 5.0mm · 0.86mm/px · 1 of 54 slices shown]
[im 1/54]
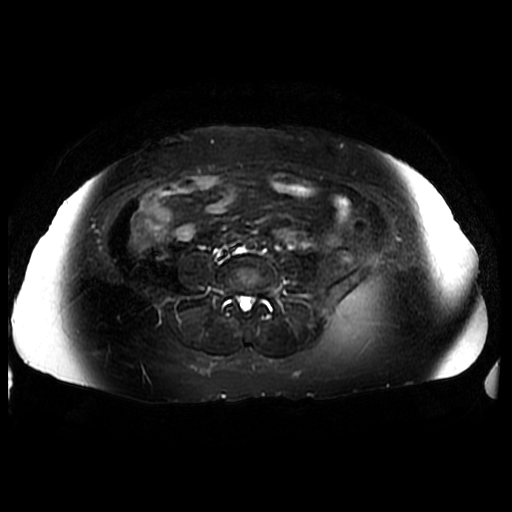

[Series 4: DWI b500 · axial · 6.0mm · 1.76mm/px · 1 of 77 slices shown]
[im 1/77]
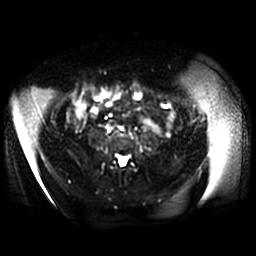

[Series 5: MRCP · coronal · 1.8mm · 0.62mm/px · 1 of 141 slices shown (1 of 3)]
[im 1/141]
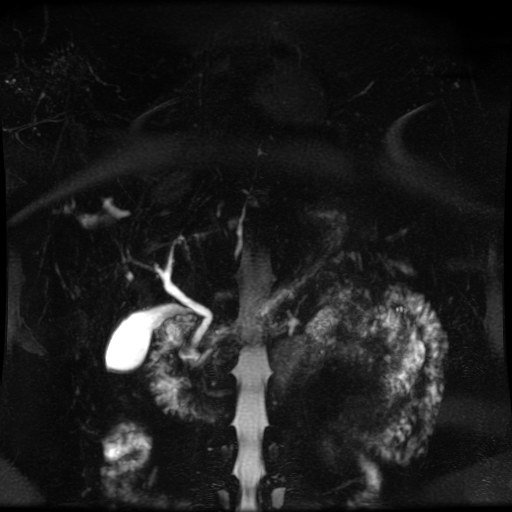

[Series 6: MRCP · coronal · 2.0mm · 0.70mm/px · 1 of 63 slices shown (2 of 3)]
[im 1/63]
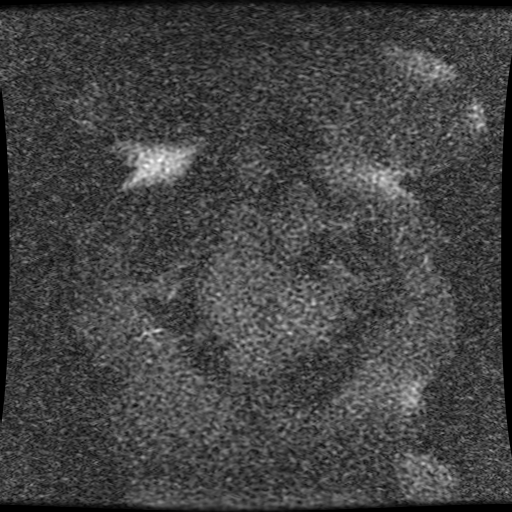

[Series 7: bSSFP fat-sat · coronal · 5.0mm · 0.78mm/px · 1 of 45 slices shown]
[im 1/45]
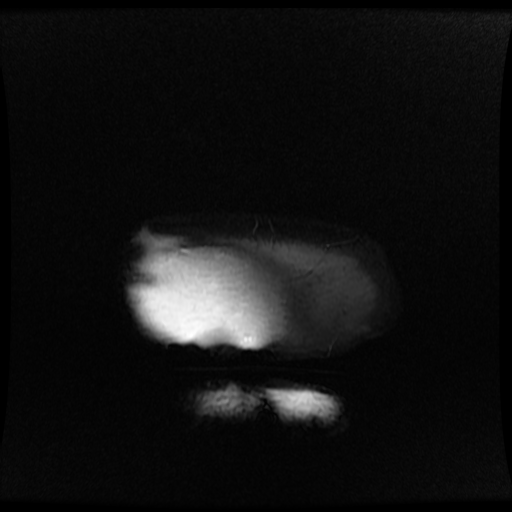

[Series 9: ax dualecho bh · axial · 5.0mm · 0.86mm/px · 1 of 108 slices shown]
[im 1/108]
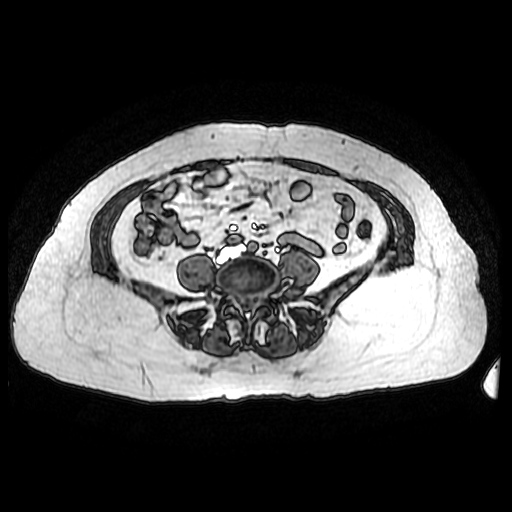

[Series 10: MRCP · coronal · 40.0mm · 0.70mm/px · 1 of 9 slices shown (3 of 3)]
[im 1/9]
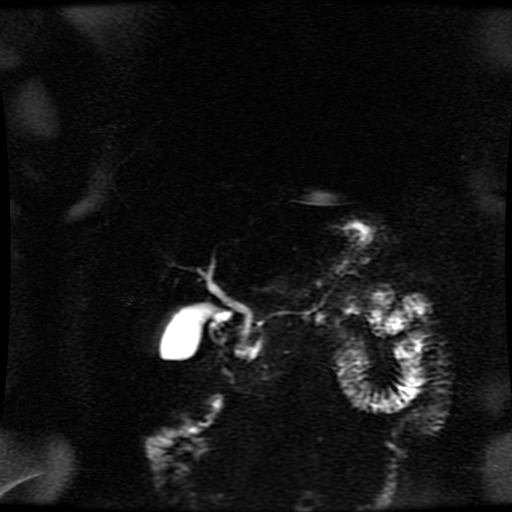

[Series 12: T1 dynamic · coronal · delayed · 6.0mm · 0.78mm/px · 1 of 80 slices shown (1 of 6)]
[im 1/80]
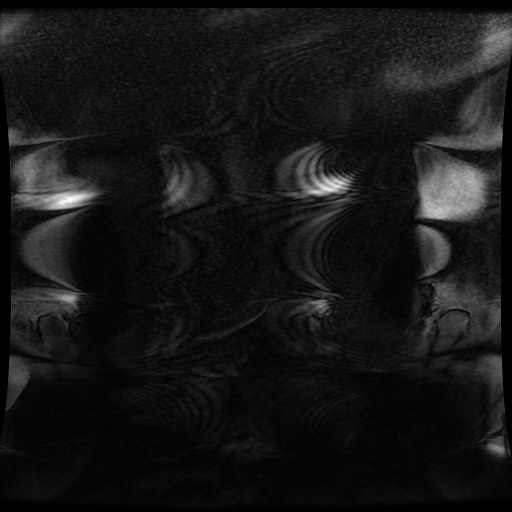

[Series 400: DWI · axial · 6.0mm · 1.76mm/px · 1 of 39 slices shown]
[im 1/39]
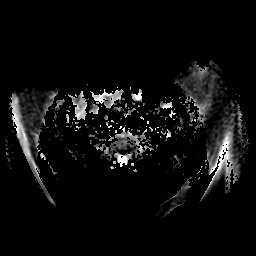

[Series 500: reformatted · axial · 1.8mm · 0.62mm/px · 1 of 117 slices shown]
[im 1/117]
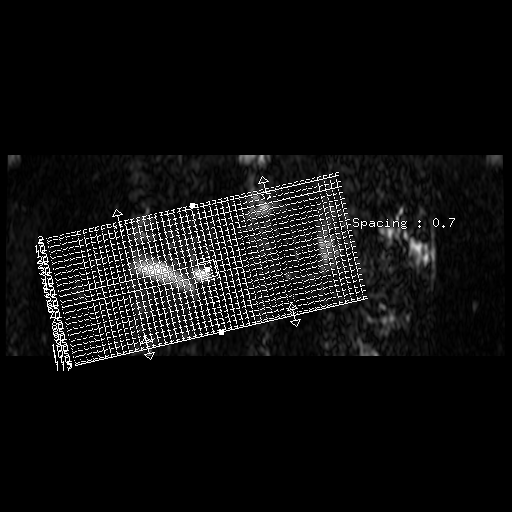

[Series 1100: T1 dynamic · axial · 5.4mm · 0.78mm/px · 1 of 88 slices shown (2 of 6)]
[im 1/88]
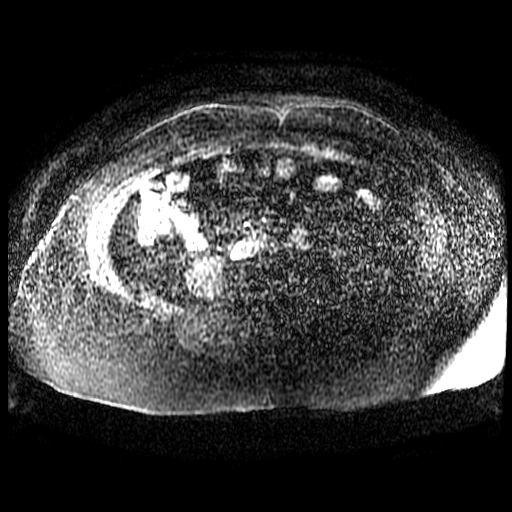

[Series 1101: T1 dynamic · axial · 5.4mm · 0.78mm/px · 1 of 88 slices shown (3 of 6)]
[im 1/88]
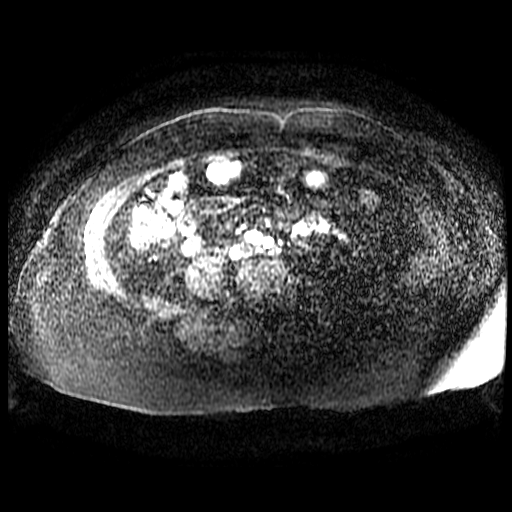

[Series 1102: T1 dynamic · axial · 5.4mm · 0.78mm/px · 1 of 88 slices shown (4 of 6)]
[im 1/88]
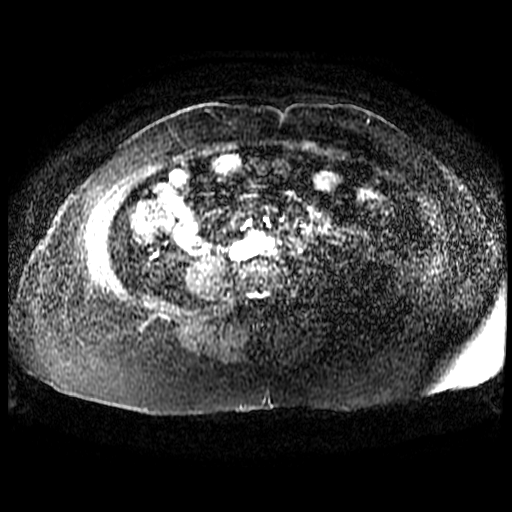

[Series 1103: T1 dynamic · axial · 5.4mm · 0.78mm/px · 1 of 88 slices shown (5 of 6)]
[im 1/88]
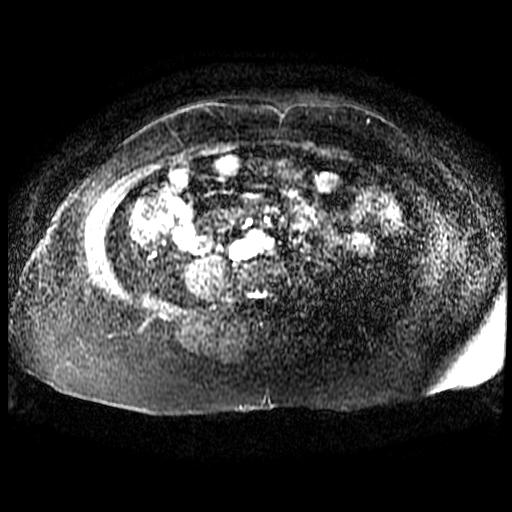

[Series 1104: T1 dynamic · axial · 5.4mm · 0.78mm/px · 1 of 88 slices shown (6 of 6)]
[im 1/88]
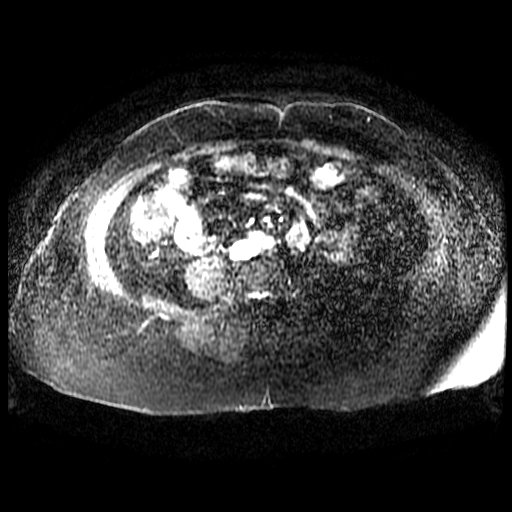

[((id)/(id)/1)-((id)/(id)/1) · axial · 5.4mm · 0.78mm/px · 1 of 88 slices shown (1 of 4)]
[im 1/88]
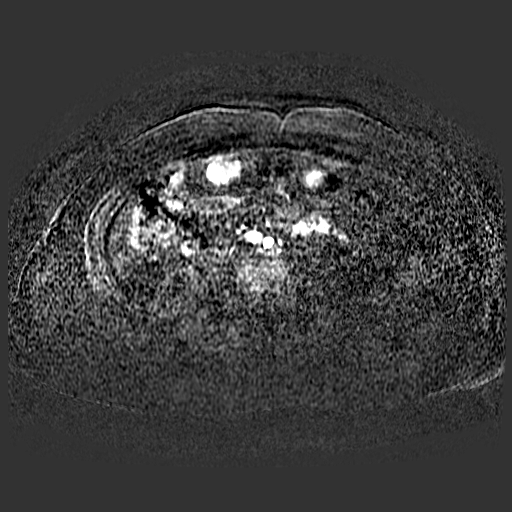

[((id)/(id)/1)-((id)/(id)/1) · axial · 5.4mm · 0.78mm/px · 1 of 88 slices shown (2 of 4)]
[im 1/88]
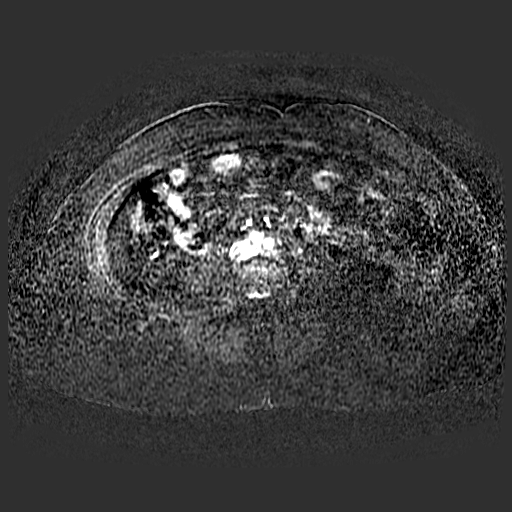

[((id)/(id)/1)-((id)/(id)/1) · axial · 5.4mm · 0.78mm/px · 1 of 88 slices shown (3 of 4)]
[im 1/88]
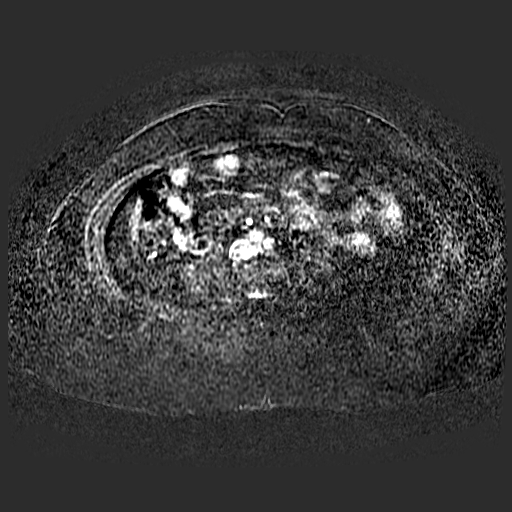

[((id)/(id)/88)-((id)/(id)/88) · axial · 5.4mm · 0.78mm/px · 1 of 88 slices shown]
[im 1/88]
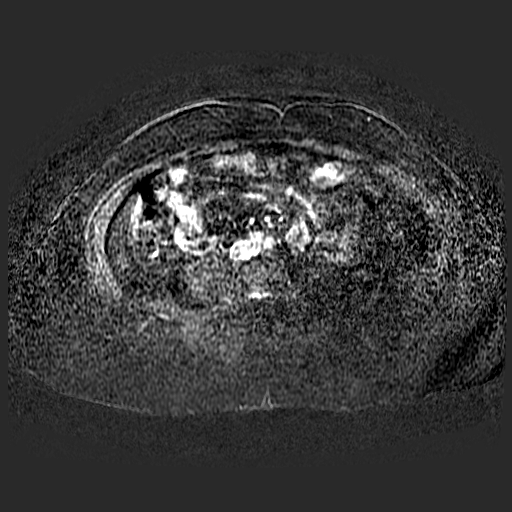

[((id)/(id)/1)-((id)/(id)/1) · axial · 5.4mm · 0.78mm/px · 1 of 88 slices shown (4 of 4)]
[im 1/88]
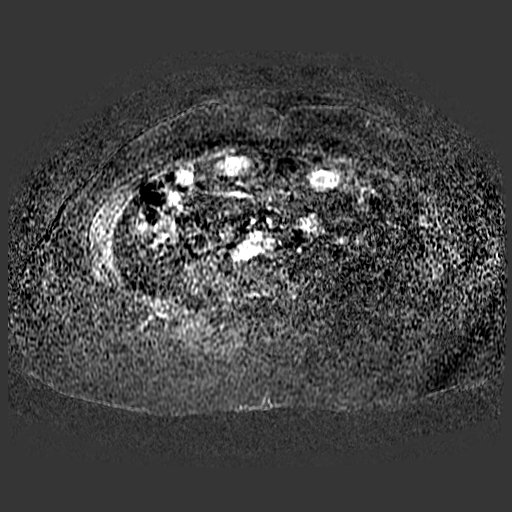

[20 of 22 positions shown; findings below may reference images not displayed]

FINDINGS: Lower chest: Unremarkable.

Hepatobiliary: Liver borderline to mildly enlarged at 17.5 cm
craniocaudal length. Tiny cyst noted medial segment left liver.
Otherwise no focal abnormality within the hepatic parenchyma. No
substantial fat deposition within the liver parenchyma. No
gallstones. No gallbladder wall thickening or pericholecystic fluid.
No intrahepatic biliary duct dilatation. Extrahepatic common duct
measures 5 mm diameter. Common bile duct in the head of the pancreas
measures 4 mm diameter. No evidence for choledocholithiasis.

Pancreas: Mild diffuse atrophy of pancreatic parenchyma noted no
dilatation of the main pancreatic duct. No findings to suggest
pancreas divisum. No appreciable peripancreatic edema or
inflammation.

Spleen:  Unremarkable.

Adrenals/Urinary Tract: Mild thickening noted both adrenal glands
without discrete nodule or mass. 16 mm cyst in the upper pole right
kidney demonstrates a thin septation laterally. No suspicious or
enhancing lesion identified in either kidney. No hydronephrosis.

Stomach/Bowel: Stomach is unremarkable. No gastric wall thickening.
No evidence of outlet obstruction. Duodenum is normally positioned
as is the ligament of Treitz. Duodenal diverticulum evident. No
small bowel or colonic dilatation within the visualized abdomen.

Vascular/Lymphatic: No abdominal aortic aneurysm. Portal vein,
superior mesenteric vein, and splenic vein are patent. There is no
gastrohepatic or hepatoduodenal ligament lymphadenopathy. No
intraperitoneal or retroperitoneal lymphadenopathy.

Other:  No intraperitoneal free fluid.

Musculoskeletal: No abnormal marrow enhancement within the
visualized bony anatomy.
IMPRESSION: 1. No MR findings of pancreatitis. No features to suggest pancreas
divisum.
2. No cholelithiasis. No biliary dilatation. No choledocholithiasis.
3. Bosniak II cyst upper pole right kidney.

## 2018-10-13 MED ORDER — GADOBUTROL 1 MMOL/ML IV SOLN
8.0000 mL | Freq: Once | INTRAVENOUS | Status: AC | PRN
  Administered 2018-10-13: 8 mL via INTRAVENOUS

## 2018-10-14 DIAGNOSIS — M069 Rheumatoid arthritis, unspecified: Secondary | ICD-10-CM | POA: Diagnosis not present

## 2018-10-14 DIAGNOSIS — R748 Abnormal levels of other serum enzymes: Secondary | ICD-10-CM | POA: Diagnosis not present

## 2018-10-14 DIAGNOSIS — K859 Acute pancreatitis without necrosis or infection, unspecified: Secondary | ICD-10-CM | POA: Diagnosis not present

## 2018-10-20 ENCOUNTER — Other Ambulatory Visit: Payer: Self-pay

## 2018-10-20 ENCOUNTER — Ambulatory Visit
Admission: RE | Admit: 2018-10-20 | Discharge: 2018-10-20 | Disposition: A | Discharge status: Home / Self Care | Payer: Medicare Other | Source: Ambulatory Visit | Attending: Internal Medicine | Admitting: Internal Medicine

## 2018-10-20 DIAGNOSIS — E2839 Other primary ovarian failure: Secondary | ICD-10-CM

## 2018-10-20 DIAGNOSIS — Z1231 Encounter for screening mammogram for malignant neoplasm of breast: Secondary | ICD-10-CM

## 2018-10-20 DIAGNOSIS — M8589 Other specified disorders of bone density and structure, multiple sites: Secondary | ICD-10-CM | POA: Diagnosis not present

## 2018-10-20 DIAGNOSIS — Z780 Asymptomatic menopausal state: Secondary | ICD-10-CM | POA: Diagnosis not present

## 2018-10-20 IMAGING — MG DIGITAL SCREENING BILATERAL MAMMOGRAM WITH TOMO AND CAD
8 series · 8 of 24 positions shown · non-contrast
Comparison: Previous exam(s).

CLINICAL DATA: Screening.

EXAM:
DIGITAL SCREENING BILATERAL MAMMOGRAM WITH TOMO AND CAD

[R CC synth-2D]
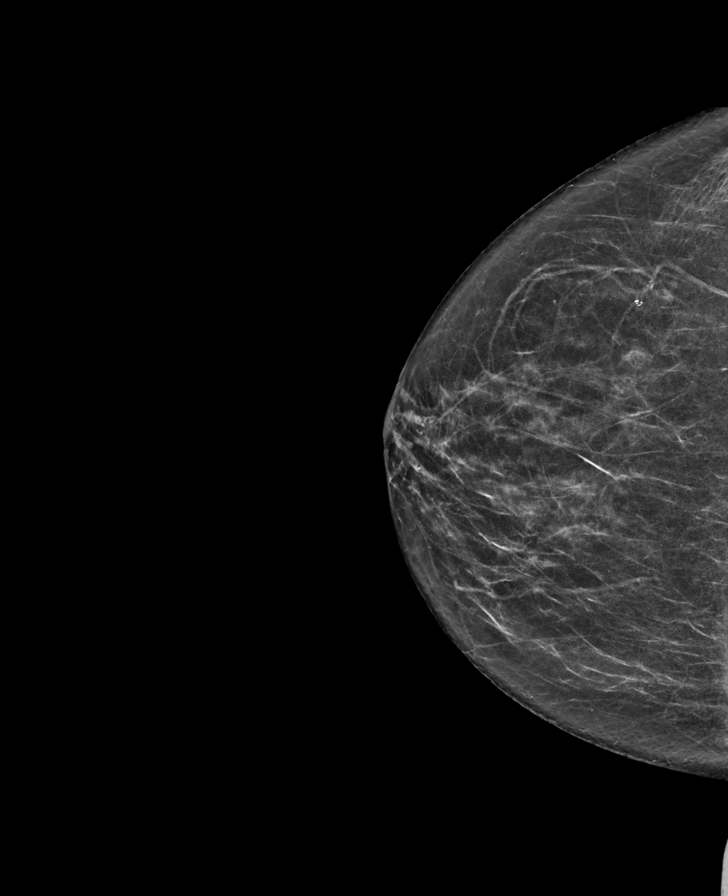

[L MLO synth-2D]
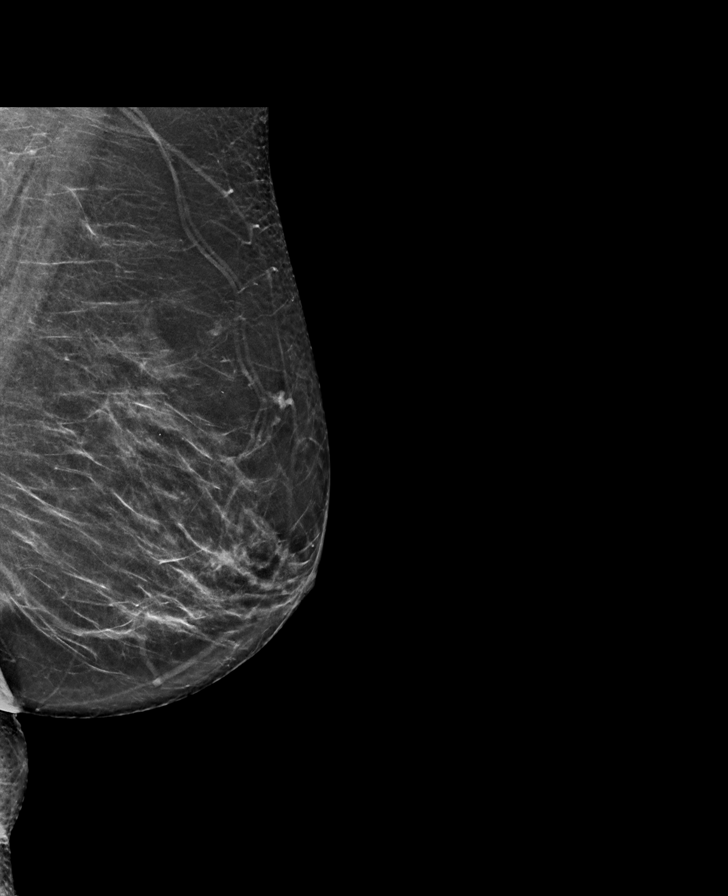

[L CC synth-2D]
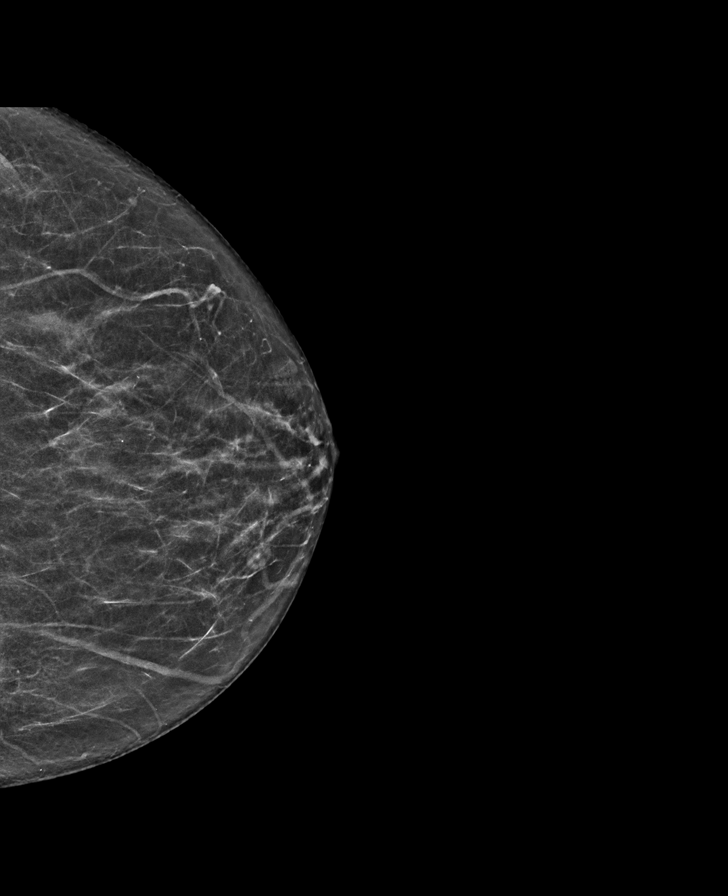

[R MLO synth-2D]
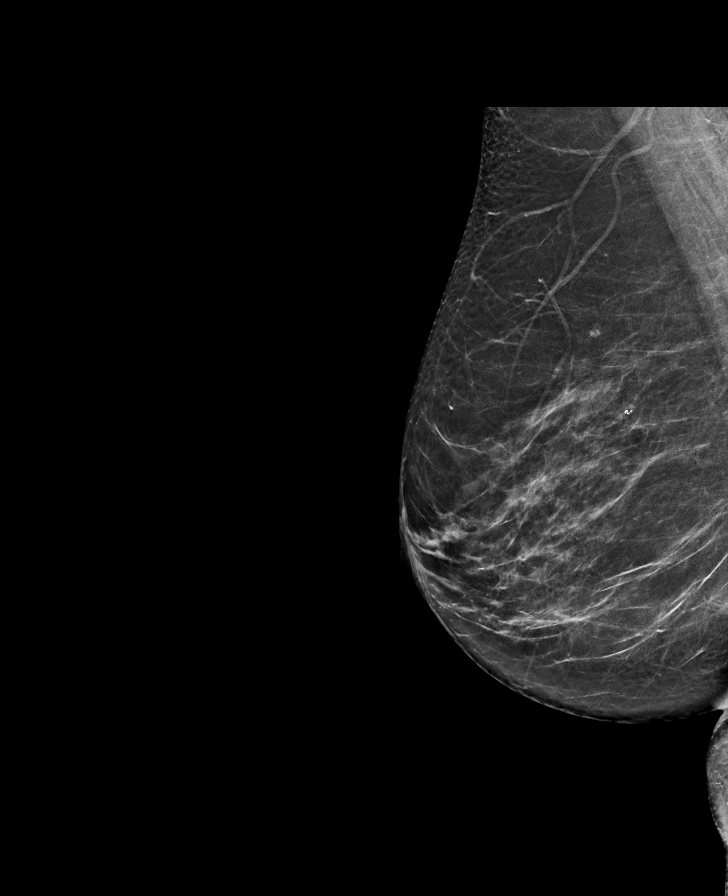

[R CC tomo · tomo slice 31/62.0]
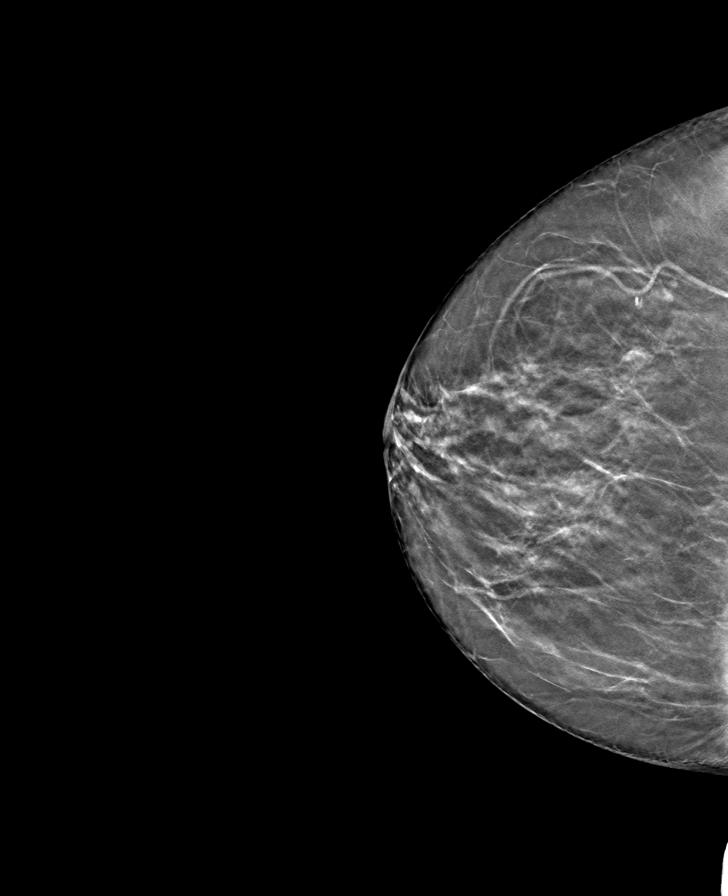

[R MLO tomo · tomo slice 37/73.0]
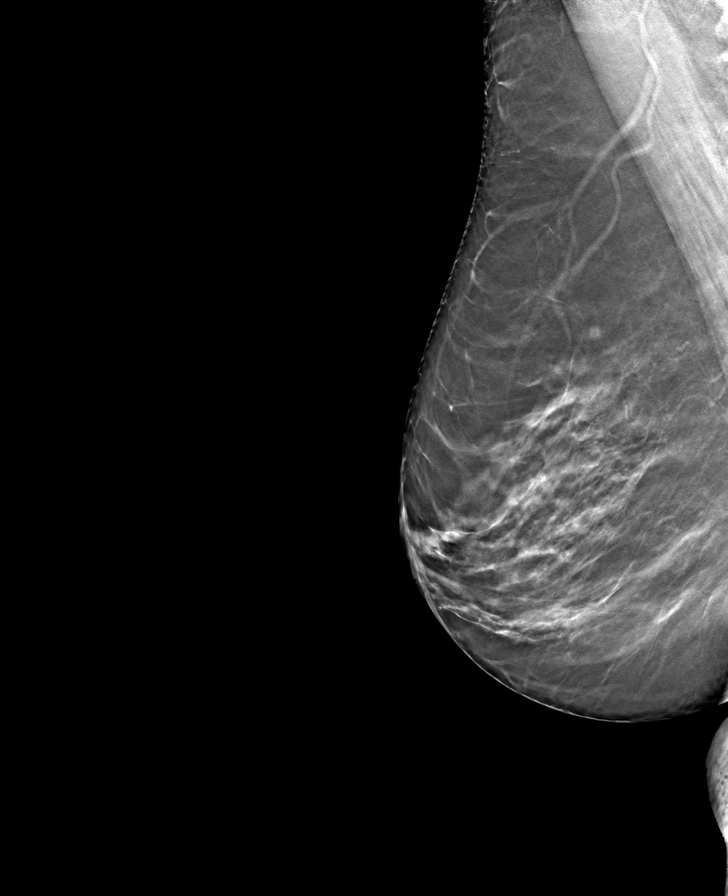

[L CC tomo · tomo slice 30/59.0]
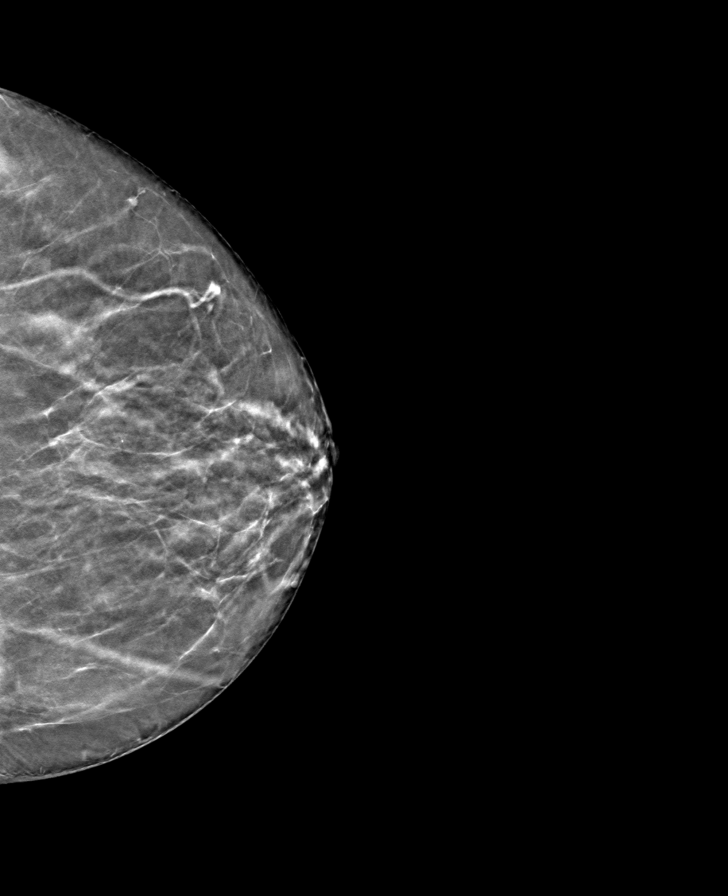

[L MLO tomo · tomo slice 36/71.0]
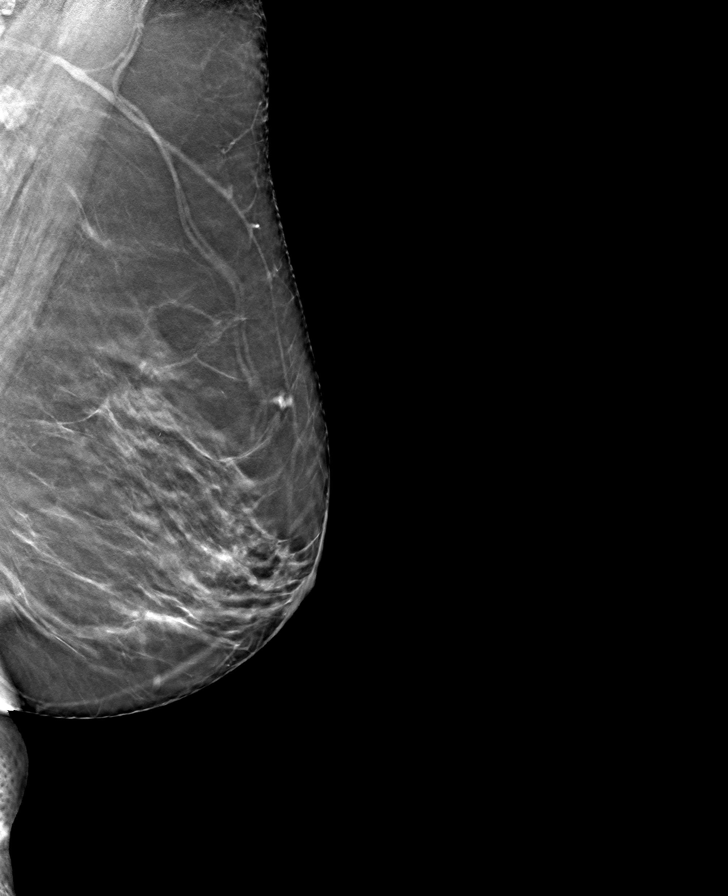

[8 of 24 positions shown; findings below may reference images not displayed]

ACR Breast Density Category b: There are scattered areas of
fibroglandular density.
FINDINGS: There are no findings suspicious for malignancy. Images were
processed with CAD.
IMPRESSION: No mammographic evidence of malignancy. A result letter of this
screening mammogram will be mailed directly to the patient.

RECOMMENDATION:
Screening mammogram in one year. (Code:[TQ])

BI-RADS CATEGORY  1: Negative.

## 2018-10-27 DIAGNOSIS — M0609 Rheumatoid arthritis without rheumatoid factor, multiple sites: Secondary | ICD-10-CM | POA: Diagnosis not present

## 2018-11-10 DIAGNOSIS — E538 Deficiency of other specified B group vitamins: Secondary | ICD-10-CM | POA: Diagnosis not present

## 2018-11-11 DIAGNOSIS — R748 Abnormal levels of other serum enzymes: Secondary | ICD-10-CM | POA: Diagnosis not present

## 2018-11-24 DIAGNOSIS — Z79899 Other long term (current) drug therapy: Secondary | ICD-10-CM | POA: Diagnosis not present

## 2018-11-24 DIAGNOSIS — M0609 Rheumatoid arthritis without rheumatoid factor, multiple sites: Secondary | ICD-10-CM | POA: Diagnosis not present

## 2018-12-13 DIAGNOSIS — E538 Deficiency of other specified B group vitamins: Secondary | ICD-10-CM | POA: Diagnosis not present

## 2018-12-22 DIAGNOSIS — M0609 Rheumatoid arthritis without rheumatoid factor, multiple sites: Secondary | ICD-10-CM | POA: Diagnosis not present

## 2018-12-23 DIAGNOSIS — M353 Polymyalgia rheumatica: Secondary | ICD-10-CM | POA: Diagnosis not present

## 2018-12-23 DIAGNOSIS — Z79899 Other long term (current) drug therapy: Secondary | ICD-10-CM | POA: Diagnosis not present

## 2018-12-23 DIAGNOSIS — M0609 Rheumatoid arthritis without rheumatoid factor, multiple sites: Secondary | ICD-10-CM | POA: Diagnosis not present

## 2018-12-23 DIAGNOSIS — M159 Polyosteoarthritis, unspecified: Secondary | ICD-10-CM | POA: Diagnosis not present

## 2018-12-23 DIAGNOSIS — M2550 Pain in unspecified joint: Secondary | ICD-10-CM | POA: Diagnosis not present

## 2019-01-05 DIAGNOSIS — K859 Acute pancreatitis without necrosis or infection, unspecified: Secondary | ICD-10-CM | POA: Diagnosis not present

## 2019-01-05 DIAGNOSIS — R197 Diarrhea, unspecified: Secondary | ICD-10-CM | POA: Diagnosis not present

## 2019-01-07 DIAGNOSIS — K859 Acute pancreatitis without necrosis or infection, unspecified: Secondary | ICD-10-CM | POA: Diagnosis not present

## 2019-01-14 DIAGNOSIS — E538 Deficiency of other specified B group vitamins: Secondary | ICD-10-CM | POA: Diagnosis not present

## 2019-01-19 DIAGNOSIS — M0609 Rheumatoid arthritis without rheumatoid factor, multiple sites: Secondary | ICD-10-CM | POA: Diagnosis not present

## 2019-02-14 DIAGNOSIS — E538 Deficiency of other specified B group vitamins: Secondary | ICD-10-CM | POA: Diagnosis not present

## 2019-02-14 DIAGNOSIS — Z23 Encounter for immunization: Secondary | ICD-10-CM | POA: Diagnosis not present

## 2019-02-16 DIAGNOSIS — M0609 Rheumatoid arthritis without rheumatoid factor, multiple sites: Secondary | ICD-10-CM | POA: Diagnosis not present

## 2019-02-21 ENCOUNTER — Other Ambulatory Visit: Payer: Self-pay

## 2019-02-21 ENCOUNTER — Ambulatory Visit (INDEPENDENT_AMBULATORY_CARE_PROVIDER_SITE_OTHER): Payer: Medicare Other

## 2019-02-21 ENCOUNTER — Encounter: Payer: Self-pay | Admitting: Podiatry

## 2019-02-21 ENCOUNTER — Ambulatory Visit (INDEPENDENT_AMBULATORY_CARE_PROVIDER_SITE_OTHER): Payer: Medicare Other | Admitting: Podiatry

## 2019-02-21 DIAGNOSIS — M2042 Other hammer toe(s) (acquired), left foot: Secondary | ICD-10-CM | POA: Diagnosis not present

## 2019-02-21 DIAGNOSIS — M779 Enthesopathy, unspecified: Secondary | ICD-10-CM

## 2019-02-21 NOTE — Progress Notes (Signed)
Subjective: 68 year old female presents the office today for concerns of bilateral foot pain.  She said the left foot started more than the right.  On the left such as being pain to the ball of her foot pointing submetatarsal 2, 3 pressure from joint discomfort is also been burning pain.  She still gets pain on the right foot as well.  She states that she did get relief from the surgery but she still has pain in the ball of her right foot as well. Denies any systemic complaints such as fevers, chills, nausea, vomiting. No acute changes since last appointment, and no other complaints at this time.   Objective: AAO x3, NAD DP/PT pulses palpable bilaterally, CRT less than 3 seconds Submetatarsal heads plantarly submetatarsal 2 and 3 in the left foot compared to contra extremity there is some swelling on the plantar aspect.  There is also palpable neuroma identified in the second interspace on the left foot. There is tenderness on the second interspace of the right foot as well but there is no significant edema there is no erythema or warmth. No pain with calf compression, swelling, warmth, erythema  Assessment: Capsulitis left foot with elongated metatarsal, neuroma; right foot capsulitis  Plan: -All treatment options discussed with the patient including all alternatives, risks, complications.  -X-rays obtained reviewed.  No evidence of acute fracture.  Elongated second third metatarsals left foot. -Regards to the right foot steroid injection performed.  See procedure note below. -We discussed both conservative as well as surgical treatment options.  This time she wants to proceed with surgery.  Discussed with her second third metatarsal osteotomies with excision of the second interspace. -The incision placement as well as the postoperative course was discussed with the patient. I discussed risks of the surgery which include, but not limited to, infection, bleeding, pain, swelling, need for further  surgery, delayed or nonhealing, painful or ugly scar, numbness or sensation changes, over/under correction, recurrence, transfer lesions, further deformity, hardware failure, DVT/PE, loss of toe/foot. Patient understands these risks and wishes to proceed with surgery. The surgical consent was reviewed with the patient all 3 pages were signed. No promises or guarantees were given to the outcome of the procedure. All questions were answered to the best of my ability. Before the surgery the patient was encouraged to call the office if there is any further questions. The surgery will be performed at the [facility] on an outpatient basis. -Patient encouraged to call the office with any questions, concerns, change in symptoms.   Procedure: Injection Tendon/Ligament Discussed alternatives, risks, complications and verbal consent was obtained.  Location: Right 2nd interspace Skin Prep: Alcohol Injectate: 0.5cc 0.5% marcaine plain, 0.5 cc 2% lidocaine plain and, 1 cc kenalog 10. Disposition: Patient tolerated procedure well. Injection site dressed with a band-aid.  Post-injection care was discussed and return precautions discussed.    Trula Slade DPM

## 2019-02-21 NOTE — Patient Instructions (Signed)
Pre-Operative Instructions  Congratulations, you have decided to take an important step towards improving your quality of life.  You can be assured that the doctors and staff at [facility] will be with you every step of the way.  Here are some important things you should know:  1. Plan to be at the surgery center/hospital at least 1 (one) hour prior to your scheduled time, unless otherwise directed by the surgical center/hospital staff.  You must have a responsible adult accompany you, remain during the surgery and drive you home.  Make sure you have directions to the surgical center/hospital to ensure you arrive on time. 2. If you are having surgery at [facility] or [facility], you will need a copy of your medical history and physical form from your family physician within one month prior to the date of surgery. We will give you a form for your primary physician to complete.  3. We make every effort to accommodate the date you request for surgery.  However, there are times where surgery dates or times have to be moved.  We will contact you as soon as possible if a change in schedule is required.   4. No aspirin/ibuprofen for one week before surgery.  If you are on aspirin, any non-steroidal anti-inflammatory medications (Mobic, Aleve, Ibuprofen) should not be taken seven (7) days prior to your surgery.  You make take Tylenol for pain prior to surgery.  5. Medications - If you are taking daily heart and blood pressure medications, seizure, reflux, allergy, asthma, anxiety, pain or diabetes medications, make sure you notify the surgery center/hospital before the day of surgery so they can tell you which medications you should take or avoid the day of surgery. 6. No food or drink after midnight the night before surgery unless directed otherwise by surgical center/hospital staff. 7. No alcoholic beverages 24-hours prior to surgery.  No smoking 24-hours prior or 24-hours after  surgery. 8. Wear loose pants or shorts. They should be loose enough to fit over bandages, boots, and casts. 9. Don't wear slip-on shoes. Sneakers are preferred. 10. Bring your boot with you to the surgery center/hospital.  Also bring crutches or a walker if your physician has prescribed it for you.  If you do not have this equipment, it will be provided for you after surgery. 11. If you have not been contacted by the surgery center/hospital by the day before your surgery, call to confirm the date and time of your surgery. 12. Leave-time from work may vary depending on the type of surgery you have.  Appropriate arrangements should be made prior to surgery with your employer. 13. Prescriptions will be provided immediately following surgery by your doctor.  Fill these as soon as possible after surgery and take the medication as directed. Pain medications will not be refilled on weekends and must be approved by the doctor. 14. Remove nail polish on the operative foot and avoid getting pedicures prior to surgery. 15. Wash the night before surgery.  The night before surgery wash the foot and leg well with water and the antibacterial soap provided. Be sure to pay special attention to beneath the toenails and in between the toes.  Wash for at least three (3) minutes. Rinse thoroughly with water and dry well with a towel.  Perform this wash unless told not to do so by your physician.  Enclosed: 1 Ice pack (please put in freezer the night before surgery)   1 Hibiclens skin cleaner     Pre-op instructions  If you have any questions regarding the instructions, please do not hesitate to call our office.  [place]: [address], [place], [place] [address] -- [phone]  [place]: [address] [place], [place] [address] -- [phone]  [place]: [address]  [place], [place] [address] -- [phone]   Website: [email or web address] 

## 2019-02-25 DIAGNOSIS — E785 Hyperlipidemia, unspecified: Secondary | ICD-10-CM | POA: Diagnosis not present

## 2019-02-25 DIAGNOSIS — M069 Rheumatoid arthritis, unspecified: Secondary | ICD-10-CM | POA: Diagnosis not present

## 2019-02-25 DIAGNOSIS — E7800 Pure hypercholesterolemia, unspecified: Secondary | ICD-10-CM | POA: Diagnosis not present

## 2019-02-25 DIAGNOSIS — I10 Essential (primary) hypertension: Secondary | ICD-10-CM | POA: Diagnosis not present

## 2019-02-25 DIAGNOSIS — M8580 Other specified disorders of bone density and structure, unspecified site: Secondary | ICD-10-CM | POA: Diagnosis not present

## 2019-02-25 DIAGNOSIS — F324 Major depressive disorder, single episode, in partial remission: Secondary | ICD-10-CM | POA: Diagnosis not present

## 2019-03-16 ENCOUNTER — Encounter: Payer: Self-pay | Admitting: Podiatry

## 2019-03-16 ENCOUNTER — Other Ambulatory Visit: Payer: Self-pay | Admitting: Podiatry

## 2019-03-16 DIAGNOSIS — M7742 Metatarsalgia, left foot: Secondary | ICD-10-CM | POA: Diagnosis not present

## 2019-03-16 DIAGNOSIS — M21542 Acquired clubfoot, left foot: Secondary | ICD-10-CM | POA: Diagnosis not present

## 2019-03-16 DIAGNOSIS — G5762 Lesion of plantar nerve, left lower limb: Secondary | ICD-10-CM | POA: Diagnosis not present

## 2019-03-16 DIAGNOSIS — J45909 Unspecified asthma, uncomplicated: Secondary | ICD-10-CM | POA: Diagnosis not present

## 2019-03-16 DIAGNOSIS — M779 Enthesopathy, unspecified: Secondary | ICD-10-CM | POA: Diagnosis not present

## 2019-03-16 DIAGNOSIS — G5763 Lesion of plantar nerve, bilateral lower limbs: Secondary | ICD-10-CM | POA: Diagnosis not present

## 2019-03-16 MED ORDER — CLINDAMYCIN HCL 150 MG PO CAPS: 150.0000 mg | ORAL_CAPSULE | Freq: Three times a day (TID) | ORAL | 0 refills | Status: DC

## 2019-03-16 NOTE — Progress Notes (Signed)
Postop medications sent to the pharmacy. Only sent clindamycin. She just had vicodin refilled that she takes as needed. She normally takes 0.5-1 tablet at a time as needed but no regular. She is going to use this for postop pain medication. Can refill if needed.  Trula Slade

## 2019-03-18 ENCOUNTER — Telehealth: Payer: Self-pay | Admitting: *Deleted

## 2019-03-18 NOTE — Telephone Encounter (Signed)
Left message informing pt that she could weight bear on the surgery foot in the boot, and she had to be in the boot at all times and could not have the foot below her heart more than 15 minutes/hour.

## 2019-03-18 NOTE — Telephone Encounter (Signed)
Pt states she had surgey 03/16/2019 with Dr. Jacqualyn Posey and would like to know if she could weight bear in the boot.

## 2019-03-19 ENCOUNTER — Telehealth: Payer: Self-pay | Admitting: *Deleted

## 2019-03-19 NOTE — Telephone Encounter (Signed)
Called the patient and stated that I was following up after the patient had surgery on 03/16/2019 with Dr Jacqualyn Posey and the patient stated that she was doing quite well and was impressed with the surgery and there is not any fever or chills and not any nausea and the first 3 days were somewhat painful but today is manageable and I stated to call the office if any concerns or questions. Marilyn Perez

## 2019-03-22 ENCOUNTER — Encounter: Payer: Self-pay | Admitting: Podiatry

## 2019-03-22 ENCOUNTER — Other Ambulatory Visit: Payer: Self-pay

## 2019-03-22 ENCOUNTER — Ambulatory Visit (INDEPENDENT_AMBULATORY_CARE_PROVIDER_SITE_OTHER): Payer: Medicare Other | Admitting: Podiatry

## 2019-03-22 ENCOUNTER — Ambulatory Visit (INDEPENDENT_AMBULATORY_CARE_PROVIDER_SITE_OTHER): Payer: Medicare Other

## 2019-03-22 DIAGNOSIS — M21962 Unspecified acquired deformity of left lower leg: Secondary | ICD-10-CM

## 2019-03-22 DIAGNOSIS — D3610 Benign neoplasm of peripheral nerves and autonomic nervous system, unspecified: Secondary | ICD-10-CM

## 2019-03-22 DIAGNOSIS — M2042 Other hammer toe(s) (acquired), left foot: Secondary | ICD-10-CM

## 2019-03-27 NOTE — Progress Notes (Signed)
Subjective: Marilyn Perez is a 68 y.o. is seen today in office s/p left second and third metatarsal osteotomy with screw fixation, neuroma excision preformed on 03/16/2019.  She states that she is doing well and her pain is controlled.  She is been wearing the cam boot.  Denies any systemic complaints such as fevers, chills, nausea, vomiting. No calf pain, chest pain, shortness of breath.   Objective: General: No acute distress, AAOx3  DP/PT pulses palpable 2/4, CRT < 3 sec to all digits.  Protective sensation intact. Motor function intact.  LEFT foot: Incision is well coapted without any evidence of dehiscence and sutures are intact. There is no surrounding erythema, ascending cellulitis, fluctuance, crepitus, malodor, drainage/purulence. There is mild edema around the surgical site. There is minimal pain along the surgical site.  The second and third toes are splaying No other areas of tenderness to bilateral lower extremities.  No other open lesions or pre-ulcerative lesions.  No pain with calf compression, swelling, warmth, erythema.   Assessment and Plan:  Status post left foot surgery, doing well with no complications   -Treatment options discussed including all alternatives, risks, and complications -X-rays obtained and reviewed.  No evidence of acute fracture.  Status post second third metatarsal osteotomy. -Antibiotic ointment and a bandage was applied.  Pin was applied in order to help splint the toes in a rectus position. -Recommend surgical boot -Ice/elevation  -Pain medication as needed. -Monitor for any clinical signs or symptoms of infection and DVT/PE and directed to call the office immediately should any occur or go to the ER. -Follow-up as scheduled or sooner if any problems arise. In the meantime, encouraged to call the office with any questions, concerns, change in symptoms.   Celesta Gentile, DPM

## 2019-03-29 ENCOUNTER — Encounter: Payer: Medicare Other | Admitting: Podiatry

## 2019-04-04 ENCOUNTER — Other Ambulatory Visit: Payer: Self-pay

## 2019-04-04 ENCOUNTER — Ambulatory Visit (INDEPENDENT_AMBULATORY_CARE_PROVIDER_SITE_OTHER): Payer: Medicare Other | Admitting: Podiatry

## 2019-04-04 DIAGNOSIS — Z09 Encounter for follow-up examination after completed treatment for conditions other than malignant neoplasm: Secondary | ICD-10-CM

## 2019-04-04 DIAGNOSIS — M21962 Unspecified acquired deformity of left lower leg: Secondary | ICD-10-CM

## 2019-04-04 DIAGNOSIS — D3610 Benign neoplasm of peripheral nerves and autonomic nervous system, unspecified: Secondary | ICD-10-CM

## 2019-04-10 NOTE — Progress Notes (Signed)
Subjective: Marilyn Perez is a 68 y.o. is seen today in office s/p left second and third metatarsal osteotomy with screw fixation, neuroma excision preformed on 03/16/2019.  She presents today for possible suture removal.  She states that she is doing well is having no pain.  She states that recovery of the surgery has been "very easy" and she has no concerns.  Denies any systemic complaints such as fevers, chills, nausea, vomiting. No calf pain, chest pain, shortness of breath.   Objective: General: No acute distress, AAOx3  DP/PT pulses palpable 2/4, CRT < 3 sec to all digits.  Protective sensation intact. Motor function intact.  LEFT foot: Incision is well coapted without any evidence of dehiscence and sutures are intact. There is no surrounding erythema, ascending cellulitis, fluctuance, crepitus, malodor, drainage/purulence. There is mild however improved edema around the surgical site. There is minimal pain along the surgical site.  The second and third toes are splaying and foot more rectus position today particularly the third digit. No other areas of tenderness to bilateral lower extremities.  No other open lesions or pre-ulcerative lesions.  No pain with calf compression, swelling, warmth, erythema.   Assessment and Plan:  Status post left foot surgery, doing well with no complications   -Treatment options discussed including all alternatives, risks, and complications -I removed The sutures today.  There is still some mild motion across the incision so I left the remainder intact.  Antibiotic ointment and a dressing applied.  Keep dressing clean, dry, intact.  I did splint the toes to help hold it in a rectus position. -Remain in surgical shoe. -Ice/elevation  -Pain medication as needed-she has not been needing this. -Monitor for any clinical signs or symptoms of infection and DVT/PE and directed to call the office immediately should any occur or go to the ER. -Follow-up as  scheduled or sooner if any problems arise. In the meantime, encouraged to call the office with any questions, concerns, change in symptoms.   Return in about 1 week (around 04/11/2019). Repeat x-ray  Celesta Gentile, DPM

## 2019-04-11 DIAGNOSIS — M0609 Rheumatoid arthritis without rheumatoid factor, multiple sites: Secondary | ICD-10-CM | POA: Diagnosis not present

## 2019-04-11 DIAGNOSIS — Z79899 Other long term (current) drug therapy: Secondary | ICD-10-CM | POA: Diagnosis not present

## 2019-04-12 ENCOUNTER — Ambulatory Visit (INDEPENDENT_AMBULATORY_CARE_PROVIDER_SITE_OTHER): Payer: Self-pay | Admitting: Podiatry

## 2019-04-12 ENCOUNTER — Ambulatory Visit (INDEPENDENT_AMBULATORY_CARE_PROVIDER_SITE_OTHER): Payer: Medicare Other

## 2019-04-12 ENCOUNTER — Other Ambulatory Visit: Payer: Self-pay

## 2019-04-12 DIAGNOSIS — M21962 Unspecified acquired deformity of left lower leg: Secondary | ICD-10-CM

## 2019-04-12 DIAGNOSIS — Z9889 Other specified postprocedural states: Secondary | ICD-10-CM

## 2019-04-12 DIAGNOSIS — Z09 Encounter for follow-up examination after completed treatment for conditions other than malignant neoplasm: Secondary | ICD-10-CM

## 2019-04-12 DIAGNOSIS — D3610 Benign neoplasm of peripheral nerves and autonomic nervous system, unspecified: Secondary | ICD-10-CM

## 2019-04-12 DIAGNOSIS — S99912A Unspecified injury of left ankle, initial encounter: Secondary | ICD-10-CM

## 2019-04-13 DIAGNOSIS — Z6826 Body mass index (BMI) 26.0-26.9, adult: Secondary | ICD-10-CM | POA: Diagnosis not present

## 2019-04-13 DIAGNOSIS — R030 Elevated blood-pressure reading, without diagnosis of hypertension: Secondary | ICD-10-CM | POA: Diagnosis not present

## 2019-04-13 DIAGNOSIS — M542 Cervicalgia: Secondary | ICD-10-CM | POA: Diagnosis not present

## 2019-04-13 DIAGNOSIS — M47812 Spondylosis without myelopathy or radiculopathy, cervical region: Secondary | ICD-10-CM | POA: Diagnosis not present

## 2019-04-13 DIAGNOSIS — M5412 Radiculopathy, cervical region: Secondary | ICD-10-CM | POA: Diagnosis not present

## 2019-04-18 NOTE — Progress Notes (Signed)
Subjective: Marilyn Perez is a 68 y.o. is seen today in office s/p left second and third metatarsal osteotomy with screw fixation, neuroma excision preformed on 03/16/2019.  She presents here for suture removal and states that she is doing well.  She has an occasional discomfort but overall improving.  She still in the offloading shoe.  She does state that she almost fell on Sunday morning twisting her ankle but she was in the boot when she fell.  No other injury.  Denies any systemic complaints such as fevers, chills, nausea, vomiting. No calf pain, chest pain, shortness of breath.   Objective: General: No acute distress, AAOx3  DP/PT pulses palpable 2/4, CRT < 3 sec to all digits.  Protective sensation intact. Motor function intact.  LEFT foot: Incision is well coapted without any evidence of dehiscence and sutures are intact. There is no surrounding erythema, ascending cellulitis, fluctuance, crepitus, malodor, drainage/purulence.  There is minimal edema.  Mild tenderness palpation surgical site but no other areas of tenderness.  The incision appears to be healing well there are any signs of infection or dehiscence.  No significant discomfort of the ankle.  No area pinpoint tenderness.  Ankle, subtalar joint range of motion intact. No other open lesions or pre-ulcerative lesions.  No pain with calf compression, swelling, warmth, erythema.   Assessment and Plan:  Status post left foot surgery, doing well with no complications   -Treatment options discussed including all alternatives, risks, and complications -X-rays obtained and reviewed.  No evidence of acute fracture.  Hardware intact. -Remainder the sutures were removed today. -Remain in surgical boot.  Continue ice elevation. -Pain medication as needed-she has not been needing this. -Monitor for any clinical signs or symptoms of infection and DVT/PE and directed to call the office immediately should any occur or go to the  ER. -Follow-up as scheduled or sooner if any problems arise. In the meantime, encouraged to call the office with any questions, concerns, change in symptoms.   Return in about 2 weeks (around 04/26/2019). Repeat x-ray  Celesta Gentile, DPM

## 2019-04-26 ENCOUNTER — Ambulatory Visit (INDEPENDENT_AMBULATORY_CARE_PROVIDER_SITE_OTHER): Payer: Medicare Other

## 2019-04-26 ENCOUNTER — Ambulatory Visit (INDEPENDENT_AMBULATORY_CARE_PROVIDER_SITE_OTHER): Payer: Medicare Other | Admitting: Podiatry

## 2019-04-26 ENCOUNTER — Other Ambulatory Visit: Payer: Self-pay

## 2019-04-26 DIAGNOSIS — M21962 Unspecified acquired deformity of left lower leg: Secondary | ICD-10-CM | POA: Diagnosis not present

## 2019-04-26 DIAGNOSIS — Z9889 Other specified postprocedural states: Secondary | ICD-10-CM

## 2019-05-05 NOTE — Progress Notes (Signed)
Subjective: Marilyn Perez is a 68 y.o. is seen today in office s/p left second and third metatarsal osteotomy with screw fixation, neuroma excision preformed on 03/16/2019.  Overall she states that she is doing well.  She still gets some discomfort.  Mostly discomfort with walking.  She also states that she been walking more than she should and should be admitted for the holidays.  She has no recent injury that she reports no other concerns.  Denies any fevers, chills, nausea, vomiting.  No calf pain, chest pain, shortness of breath.   Objective: General: No acute distress, AAOx3  DP/PT pulses palpable 2/4, CRT < 3 sec to all digits.  Protective sensation intact. Motor function intact.  LEFT foot: Incision is well coapted without any evidence of dehiscence and the incisions healing well any signs of infection or dehiscence.  Mild tenderness palpation of the surgical site minimal swelling.  There is no erythema or warmth.  No other areas of discomfort identified today.   No other open lesions or pre-ulcerative lesions.  No pain with calf compression, swelling, warmth, erythema.   Assessment and Plan:  Status post left foot surgery, doing well with no complications   -Treatment options discussed including all alternatives, risks, and complications -X-rays obtained and reviewed.  No evidence of acute fracture.  Hardware intact.  Is a loosening of the hardware. -Discussed wearing a stiffer soled shoe.  Continue ice elevate. -Monitor for any clinical signs or symptoms of infection and DVT/PE and directed to call the office immediately should any occur or go to the ER. -Follow-up as scheduled or sooner if any problems arise. In the meantime, encouraged to call the office with any questions, concerns, change in symptoms.   Return in about 2 weeks  Repeat x-ray  Celesta Gentile, DPM

## 2019-05-16 DIAGNOSIS — E538 Deficiency of other specified B group vitamins: Secondary | ICD-10-CM | POA: Diagnosis not present

## 2019-05-17 ENCOUNTER — Encounter: Payer: Self-pay | Admitting: Podiatry

## 2019-05-17 ENCOUNTER — Other Ambulatory Visit: Payer: Self-pay

## 2019-05-17 ENCOUNTER — Ambulatory Visit (INDEPENDENT_AMBULATORY_CARE_PROVIDER_SITE_OTHER): Payer: Medicare Other | Admitting: Podiatry

## 2019-05-17 ENCOUNTER — Ambulatory Visit (INDEPENDENT_AMBULATORY_CARE_PROVIDER_SITE_OTHER): Payer: Medicare Other

## 2019-05-17 DIAGNOSIS — M21962 Unspecified acquired deformity of left lower leg: Secondary | ICD-10-CM | POA: Diagnosis not present

## 2019-05-17 DIAGNOSIS — Z9889 Other specified postprocedural states: Secondary | ICD-10-CM

## 2019-05-18 DIAGNOSIS — R030 Elevated blood-pressure reading, without diagnosis of hypertension: Secondary | ICD-10-CM | POA: Diagnosis not present

## 2019-05-18 DIAGNOSIS — M5412 Radiculopathy, cervical region: Secondary | ICD-10-CM | POA: Diagnosis not present

## 2019-05-18 DIAGNOSIS — M47812 Spondylosis without myelopathy or radiculopathy, cervical region: Secondary | ICD-10-CM | POA: Diagnosis not present

## 2019-05-25 NOTE — Progress Notes (Signed)
Subjective: Marilyn Perez is a 69 y.o. is seen today in office s/p left second and third metatarsal osteotomy with screw fixation, neuroma excision preformed on 03/16/2019.  She states that she is doing well and should not have any significant discomfort.  She is back to regular shoe.  She has no new concerns.  Denies any fevers, chills, nausea, vomiting.  No calf pain, chest pain, shortness of breath.   Objective: General: No acute distress, AAOx3  DP/PT pulses palpable 2/4, CRT < 3 sec to all digits.  Protective sensation intact. Motor function intact.  LEFT foot: Incision is well coapted without any evidence of dehiscence and the incisions healing well any signs of infection or dehiscence.  The tenderness resolved along the surgical site.  She has more stiffness to her ankle since going back into regular shoe but no pain on the surgery.  No edema, erythema. No other open lesions or pre-ulcerative lesions.  No pain with calf compression, swelling, warmth, erythema.   Assessment and Plan:  Status post left foot surgery, doing well with no complications   -Treatment options discussed including all alternatives, risks, and complications -X-rays obtained reviewed.  Hardware intact without any evidence of acute fracture.  Osteotomy sites appear to be stable -Continue with regular shoes peer discussed her no stretching, rehab exercises for her ankle.  Ice and compression as needed.  Ice, discharge given postoperative course and she agrees with this plan.  Should there be any issues or concerns she will give [place] a call.  Return if symptoms worsen or fail to improve.  Trula Slade DPM

## 2019-05-26 ENCOUNTER — Ambulatory Visit: Payer: Medicare Other | Attending: Internal Medicine

## 2019-05-26 DIAGNOSIS — Z23 Encounter for immunization: Secondary | ICD-10-CM | POA: Insufficient documentation

## 2019-05-26 NOTE — Progress Notes (Signed)
   Covid-19 Vaccination Clinic  Name:  VAUNE MCFARLAN    MRN: [id number] DOB: 1950/05/12  05/26/2019  Ms. Reine was observed post Covid-19 immunization for 15 minutes without incidence. She was provided with Vaccine Information Sheet and instruction to access the V-Safe system.   Ms. Ketcham was instructed to call 911 with any severe reactions post vaccine: Marland Kitchen Difficulty breathing  . Swelling of your face and throat  . A fast heartbeat  . A bad rash all over your body  . Dizziness and weakness    Immunizations Administered    Name Date Dose VIS Date Route   Pfizer COVID-19 Vaccine 05/26/2019  6:13 PM 0.3 mL 04/15/2019 Intramuscular   Manufacturer: [place]   Lot: [id number]   [place]: [id number]

## 2019-06-06 DIAGNOSIS — M0609 Rheumatoid arthritis without rheumatoid factor, multiple sites: Secondary | ICD-10-CM | POA: Diagnosis not present

## 2019-06-16 ENCOUNTER — Ambulatory Visit: Payer: Medicare Other | Attending: Internal Medicine

## 2019-06-16 DIAGNOSIS — Z23 Encounter for immunization: Secondary | ICD-10-CM | POA: Insufficient documentation

## 2019-06-16 NOTE — Progress Notes (Signed)
   Covid-19 Vaccination Clinic  Name:  Marilyn Perez    MRN: [id number] DOB: [birth year]  06/16/2019  [name] was observed post Covid-19 immunization for 15 minutes without incidence. She was provided with Vaccine Information Sheet and instruction to access the V-Safe system.   [name] was instructed to call 911 with any severe reactions post vaccine: Marland Kitchen Difficulty breathing  . Swelling of your face and throat  . A fast heartbeat  . A bad rash all over your body  . Dizziness and weakness    Immunizations Administered    Name Date Dose VIS Date Route   Pfizer COVID-19 Vaccine 06/16/2019 12:05 PM 0.3 mL 04/15/2019 Intramuscular   Manufacturer: [place]   Lot: [id number]   [place]: [id number]

## 2019-06-27 DIAGNOSIS — M353 Polymyalgia rheumatica: Secondary | ICD-10-CM | POA: Diagnosis not present

## 2019-06-27 DIAGNOSIS — E663 Overweight: Secondary | ICD-10-CM | POA: Diagnosis not present

## 2019-06-27 DIAGNOSIS — Z6827 Body mass index (BMI) 27.0-27.9, adult: Secondary | ICD-10-CM | POA: Diagnosis not present

## 2019-06-27 DIAGNOSIS — Z79899 Other long term (current) drug therapy: Secondary | ICD-10-CM | POA: Diagnosis not present

## 2019-06-27 DIAGNOSIS — M2550 Pain in unspecified joint: Secondary | ICD-10-CM | POA: Diagnosis not present

## 2019-06-27 DIAGNOSIS — M0609 Rheumatoid arthritis without rheumatoid factor, multiple sites: Secondary | ICD-10-CM | POA: Diagnosis not present

## 2019-06-27 DIAGNOSIS — M159 Polyosteoarthritis, unspecified: Secondary | ICD-10-CM | POA: Diagnosis not present

## 2019-07-04 DIAGNOSIS — M0609 Rheumatoid arthritis without rheumatoid factor, multiple sites: Secondary | ICD-10-CM | POA: Diagnosis not present

## 2019-07-12 DIAGNOSIS — E785 Hyperlipidemia, unspecified: Secondary | ICD-10-CM | POA: Diagnosis not present

## 2019-07-12 DIAGNOSIS — E7800 Pure hypercholesterolemia, unspecified: Secondary | ICD-10-CM | POA: Diagnosis not present

## 2019-07-12 DIAGNOSIS — I10 Essential (primary) hypertension: Secondary | ICD-10-CM | POA: Diagnosis not present

## 2019-07-12 DIAGNOSIS — M8580 Other specified disorders of bone density and structure, unspecified site: Secondary | ICD-10-CM | POA: Diagnosis not present

## 2019-07-12 DIAGNOSIS — F324 Major depressive disorder, single episode, in partial remission: Secondary | ICD-10-CM | POA: Diagnosis not present

## 2019-07-12 DIAGNOSIS — M069 Rheumatoid arthritis, unspecified: Secondary | ICD-10-CM | POA: Diagnosis not present

## 2019-08-01 DIAGNOSIS — M0609 Rheumatoid arthritis without rheumatoid factor, multiple sites: Secondary | ICD-10-CM | POA: Diagnosis not present

## 2019-08-02 ENCOUNTER — Encounter: Payer: Self-pay | Admitting: Podiatry

## 2019-08-02 ENCOUNTER — Other Ambulatory Visit: Payer: Self-pay

## 2019-08-02 ENCOUNTER — Ambulatory Visit (INDEPENDENT_AMBULATORY_CARE_PROVIDER_SITE_OTHER): Payer: Medicare Other | Admitting: Podiatry

## 2019-08-02 VITALS — Temp 97.6°F

## 2019-08-02 DIAGNOSIS — G8929 Other chronic pain: Secondary | ICD-10-CM

## 2019-08-02 DIAGNOSIS — M779 Enthesopathy, unspecified: Secondary | ICD-10-CM | POA: Diagnosis not present

## 2019-08-02 DIAGNOSIS — M79673 Pain in unspecified foot: Secondary | ICD-10-CM

## 2019-08-02 DIAGNOSIS — M1990 Unspecified osteoarthritis, unspecified site: Secondary | ICD-10-CM | POA: Diagnosis not present

## 2019-08-08 NOTE — Progress Notes (Signed)
Subjective: 69 year old female presents the office today for generalized foot pain and she wants to discuss orthopedic/therapeutic shoes. She points the top of her feet where she gets a lot of pain.  Also so there is some burning to her feet.  She states that she tries to walk but she is having difficulty walking for long periods of time.  No recent injury or trauma.  No swelling.  No radiating pain or weakness.Denies any systemic complaints such as fevers, chills, nausea, vomiting. No acute changes since last appointment, and no other complaints at this time.   Objective: AAO x3, NAD DP/PT pulses palpable bilaterally, CRT less than 3 seconds There is generalized discomfort to both feet mostly along the dorsal aspect along Lisfranc, midfoot joints.  There is no specific area pinpoint tenderness.  Flexor, extensor tendons appear to be intact.  No edema, erythema.  The surgery sites are well-healed.   No pain with calf compression, swelling, warmth, erythema  Assessment: Bilateral foot arthritis; chronic foot pain  Plan: -All treatment options discussed with the patient including all alternatives, risks, complications.  -We did discuss changing shoes.  Will check with insurance to see about getting therapeutic shoes given her pain as well as the arthritis.  She does not want custom orthotics we discussed almost a diabetic type insert with shoes to help cushion with offload. -Patient encouraged to call the office with any questions, concerns, change in symptoms.   Trula Slade DPM

## 2019-08-10 DIAGNOSIS — M8580 Other specified disorders of bone density and structure, unspecified site: Secondary | ICD-10-CM | POA: Diagnosis not present

## 2019-08-10 DIAGNOSIS — E538 Deficiency of other specified B group vitamins: Secondary | ICD-10-CM | POA: Diagnosis not present

## 2019-08-10 DIAGNOSIS — Z720 Tobacco use: Secondary | ICD-10-CM | POA: Diagnosis not present

## 2019-08-10 DIAGNOSIS — Z1211 Encounter for screening for malignant neoplasm of colon: Secondary | ICD-10-CM | POA: Diagnosis not present

## 2019-08-10 DIAGNOSIS — E7800 Pure hypercholesterolemia, unspecified: Secondary | ICD-10-CM | POA: Diagnosis not present

## 2019-08-10 DIAGNOSIS — M069 Rheumatoid arthritis, unspecified: Secondary | ICD-10-CM | POA: Diagnosis not present

## 2019-08-10 DIAGNOSIS — Z1389 Encounter for screening for other disorder: Secondary | ICD-10-CM | POA: Diagnosis not present

## 2019-08-10 DIAGNOSIS — F17210 Nicotine dependence, cigarettes, uncomplicated: Secondary | ICD-10-CM | POA: Diagnosis not present

## 2019-08-10 DIAGNOSIS — F324 Major depressive disorder, single episode, in partial remission: Secondary | ICD-10-CM | POA: Diagnosis not present

## 2019-08-10 DIAGNOSIS — Z0000 Encounter for general adult medical examination without abnormal findings: Secondary | ICD-10-CM | POA: Diagnosis not present

## 2019-08-18 ENCOUNTER — Other Ambulatory Visit: Payer: Self-pay

## 2019-08-18 ENCOUNTER — Ambulatory Visit: Payer: Medicare Other | Admitting: Orthotics

## 2019-08-18 DIAGNOSIS — G8929 Other chronic pain: Secondary | ICD-10-CM

## 2019-08-18 DIAGNOSIS — M779 Enthesopathy, unspecified: Secondary | ICD-10-CM

## 2019-08-18 DIAGNOSIS — M1990 Unspecified osteoarthritis, unspecified site: Secondary | ICD-10-CM

## 2019-08-18 NOTE — Progress Notes (Signed)
Gave patient pair of DB inserts we had left over.

## 2019-08-29 DIAGNOSIS — M0609 Rheumatoid arthritis without rheumatoid factor, multiple sites: Secondary | ICD-10-CM | POA: Diagnosis not present

## 2019-09-15 DIAGNOSIS — E538 Deficiency of other specified B group vitamins: Secondary | ICD-10-CM | POA: Diagnosis not present

## 2019-09-26 DIAGNOSIS — M0609 Rheumatoid arthritis without rheumatoid factor, multiple sites: Secondary | ICD-10-CM | POA: Diagnosis not present

## 2019-10-24 DIAGNOSIS — M0609 Rheumatoid arthritis without rheumatoid factor, multiple sites: Secondary | ICD-10-CM | POA: Diagnosis not present

## 2019-11-16 DIAGNOSIS — L821 Other seborrheic keratosis: Secondary | ICD-10-CM | POA: Diagnosis not present

## 2019-11-16 DIAGNOSIS — M170 Bilateral primary osteoarthritis of knee: Secondary | ICD-10-CM | POA: Diagnosis not present

## 2019-11-16 DIAGNOSIS — D485 Neoplasm of uncertain behavior of skin: Secondary | ICD-10-CM | POA: Diagnosis not present

## 2019-11-21 DIAGNOSIS — M0609 Rheumatoid arthritis without rheumatoid factor, multiple sites: Secondary | ICD-10-CM | POA: Diagnosis not present

## 2019-12-11 DIAGNOSIS — M25512 Pain in left shoulder: Secondary | ICD-10-CM | POA: Diagnosis not present

## 2019-12-19 DIAGNOSIS — M0609 Rheumatoid arthritis without rheumatoid factor, multiple sites: Secondary | ICD-10-CM | POA: Diagnosis not present

## 2019-12-26 DIAGNOSIS — M159 Polyosteoarthritis, unspecified: Secondary | ICD-10-CM | POA: Diagnosis not present

## 2019-12-26 DIAGNOSIS — M0609 Rheumatoid arthritis without rheumatoid factor, multiple sites: Secondary | ICD-10-CM | POA: Diagnosis not present

## 2019-12-26 DIAGNOSIS — M353 Polymyalgia rheumatica: Secondary | ICD-10-CM | POA: Diagnosis not present

## 2019-12-26 DIAGNOSIS — R197 Diarrhea, unspecified: Secondary | ICD-10-CM | POA: Diagnosis not present

## 2019-12-26 DIAGNOSIS — Z79899 Other long term (current) drug therapy: Secondary | ICD-10-CM | POA: Diagnosis not present

## 2019-12-26 DIAGNOSIS — M2550 Pain in unspecified joint: Secondary | ICD-10-CM | POA: Diagnosis not present

## 2019-12-26 DIAGNOSIS — Z6825 Body mass index (BMI) 25.0-25.9, adult: Secondary | ICD-10-CM | POA: Diagnosis not present

## 2019-12-26 DIAGNOSIS — E663 Overweight: Secondary | ICD-10-CM | POA: Diagnosis not present

## 2020-01-02 DIAGNOSIS — E538 Deficiency of other specified B group vitamins: Secondary | ICD-10-CM | POA: Diagnosis not present

## 2020-01-16 DIAGNOSIS — M0609 Rheumatoid arthritis without rheumatoid factor, multiple sites: Secondary | ICD-10-CM | POA: Diagnosis not present

## 2020-01-17 ENCOUNTER — Other Ambulatory Visit: Payer: Self-pay

## 2020-01-17 ENCOUNTER — Ambulatory Visit (INDEPENDENT_AMBULATORY_CARE_PROVIDER_SITE_OTHER): Payer: Medicare Other | Admitting: Podiatry

## 2020-01-17 DIAGNOSIS — M1990 Unspecified osteoarthritis, unspecified site: Secondary | ICD-10-CM | POA: Diagnosis not present

## 2020-01-17 DIAGNOSIS — M7752 Other enthesopathy of left foot: Secondary | ICD-10-CM

## 2020-01-17 DIAGNOSIS — M19072 Primary osteoarthritis, left ankle and foot: Secondary | ICD-10-CM

## 2020-01-17 DIAGNOSIS — G8929 Other chronic pain: Secondary | ICD-10-CM | POA: Diagnosis not present

## 2020-01-17 DIAGNOSIS — M25572 Pain in left ankle and joints of left foot: Secondary | ICD-10-CM | POA: Diagnosis not present

## 2020-01-17 DIAGNOSIS — M779 Enthesopathy, unspecified: Secondary | ICD-10-CM

## 2020-01-17 NOTE — Patient Instructions (Signed)
For instructions on how to put on your Tri-Lock Ankle Brace, please visit [email or web address] 

## 2020-01-20 ENCOUNTER — Telehealth: Payer: Self-pay | Admitting: Podiatry

## 2020-01-20 DIAGNOSIS — R197 Diarrhea, unspecified: Secondary | ICD-10-CM | POA: Diagnosis not present

## 2020-01-20 NOTE — Telephone Encounter (Signed)
Pt said she has not heard from [facility] about scheduling an MRI. We did not see an order in the chart.

## 2020-01-20 NOTE — Telephone Encounter (Signed)
Lattie Haw- can you please check on this. The order was placed on 9/14 and is in the chart

## 2020-01-23 ENCOUNTER — Telehealth: Payer: Self-pay | Admitting: *Deleted

## 2020-01-23 NOTE — Progress Notes (Signed)
Subjective: 69 year old female presents the office today for concerns of left foot, ankle pain.  She states the pain is becoming more consistent hurting all the time and she was able to rest slightly worse.  She denies any recent injury or trauma.  No recent swelling or redness.  No recent treatment.  No other concerns today. Denies any systemic complaints such as fevers, chills, nausea, vomiting. No acute changes since last appointment, and no other complaints at this time.   Objective: AAO x3, NAD DP/PT pulses palpable bilaterally, CRT less than 3 seconds There is tenderness palpation along the anterior lateral ankle joint as well as the sinus tarsi.  Discomfort diffusely on the dorsal Lisfranc joint.  There is mild edema present in the joints with no erythema or warmth.  MMT 5/5.  Flexor, extensor tendons appear to be intact. No pain with calf compression, swelling, warmth, erythema  Assessment: 69 year old female with capsulitis, rheumatoid arthritis  Plan: -All treatment options discussed with the patient including all alternatives, risks, complications.  -X-rays obtained reviewed.  Significant arthritic changes present Lisfranc joint.  Ankle joint appears to be intact.  No evidence of acute fracture. -We discussed with conservative versus surgical treatment options.  I recommend MRI for surgical planning to further evaluate the ankle, subtalar joint arthritis. -Steroid injection performed to the ankle joint.  See procedure note below. -Trilock ankle brace for now.  -Patient encouraged to call the office with any questions, concerns, change in symptoms.   Procedure: Injection intermediate joint Discussed alternatives, risks, complications and verbal consent was obtained.  Location: Left ankle Skin Prep: Betadine  Injectate: 0.5cc 0.5% marcaine plain, 0.5 cc 2% lidocaine plain and, 1 cc kenalog 10. Disposition: Patient tolerated procedure well. Injection site dressed with a band-aid.   Post-injection care was discussed and return precautions discussed.   No follow-ups on file.  Trula Slade DPM

## 2020-01-23 NOTE — Telephone Encounter (Signed)
Patient has a 7:40 am appointment on 01-26-2020 for [name]. Marilyn Perez

## 2020-01-26 ENCOUNTER — Other Ambulatory Visit: Payer: Self-pay

## 2020-01-26 ENCOUNTER — Ambulatory Visit
Admission: RE | Admit: 2020-01-26 | Discharge: 2020-01-26 | Disposition: A | Discharge status: Home / Self Care | Payer: Medicare Other | Source: Ambulatory Visit | Attending: Podiatry | Admitting: Podiatry

## 2020-01-26 DIAGNOSIS — M19072 Primary osteoarthritis, left ankle and foot: Secondary | ICD-10-CM

## 2020-01-26 DIAGNOSIS — M25472 Effusion, left ankle: Secondary | ICD-10-CM | POA: Diagnosis not present

## 2020-01-26 DIAGNOSIS — G8929 Other chronic pain: Secondary | ICD-10-CM | POA: Diagnosis not present

## 2020-01-26 DIAGNOSIS — Z981 Arthrodesis status: Secondary | ICD-10-CM | POA: Diagnosis not present

## 2020-01-26 DIAGNOSIS — M779 Enthesopathy, unspecified: Secondary | ICD-10-CM

## 2020-01-26 IMAGING — MR MR ANKLE*L* W/O CM
5 series · 36 of 40 positions shown · non-contrast
Comparison: None.

CLINICAL DATA: Chronic ankle pain.

EXAM:
MRI OF THE LEFT ANKLE WITHOUT CONTRAST
TECHNIQUE: Multiplanar, multisequence MR imaging of the ankle was performed. No
intravenous contrast was administered.

[Series 4: T2 fat-sat · axial · 3.0mm · 0.50mm/px · z∈[-49,+71]mm · 9 of 32 slices shown (1 of 2)]
[im 1/32]
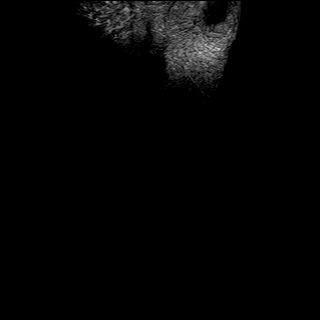
[im 4/32]
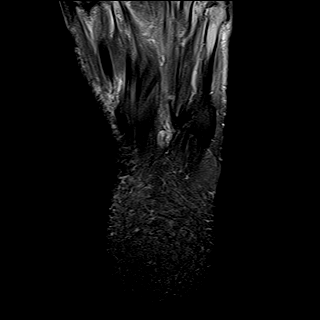
[im 8/32]
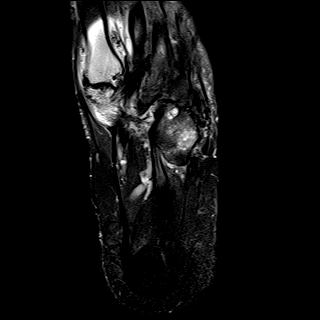
[im 12/32]
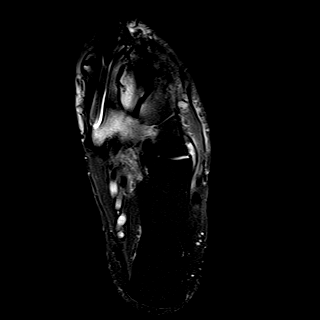
[im 16/32]
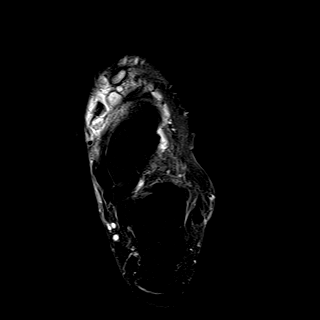
[im 20/32]
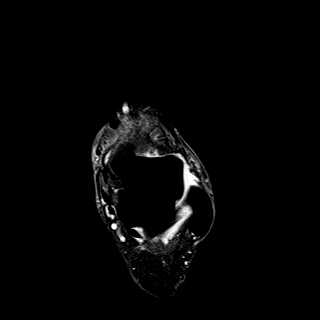
[im 24/32]
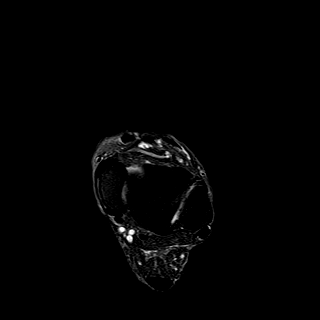
[im 28/32]
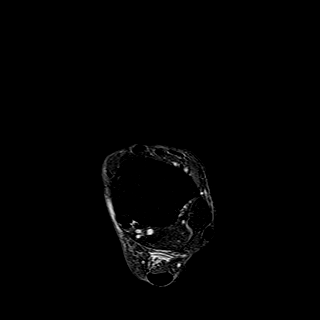
[im 32/32]
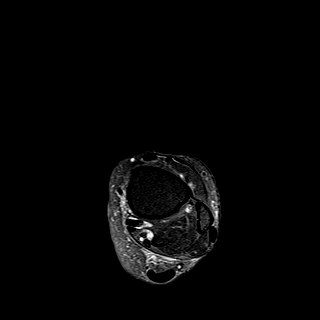

[Series 5: PD fat-sat · axial · 3.0mm · 0.42mm/px · z∈[-50,+71]mm · 9 of 32 slices shown]
[im 1/32]
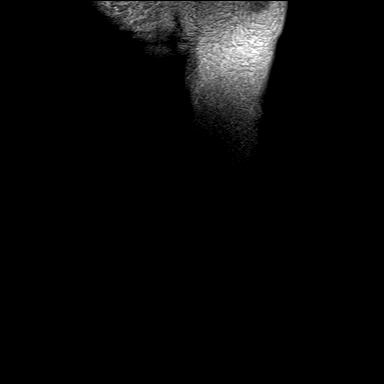
[im 4/32]
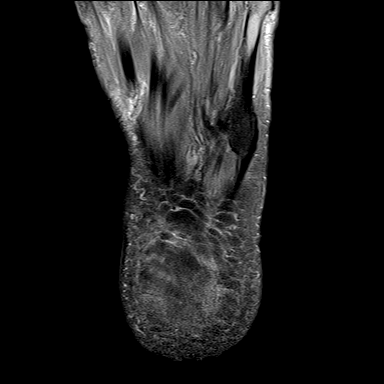
[im 8/32]
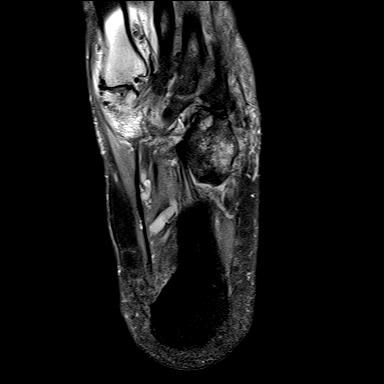
[im 12/32]
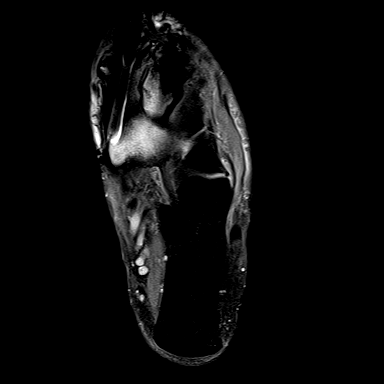
[im 16/32]
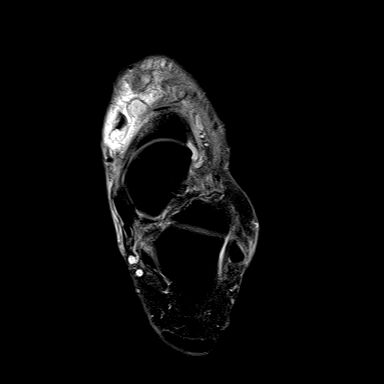
[im 20/32]
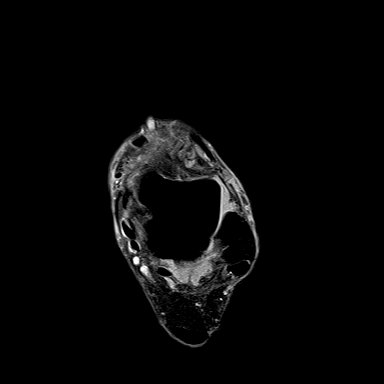
[im 24/32]
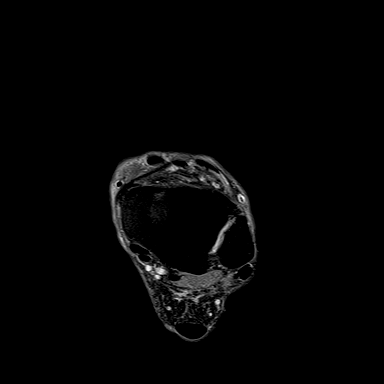
[im 28/32]
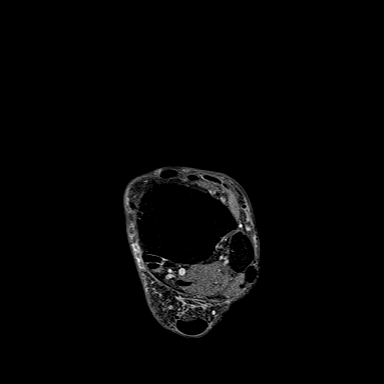
[im 32/32]
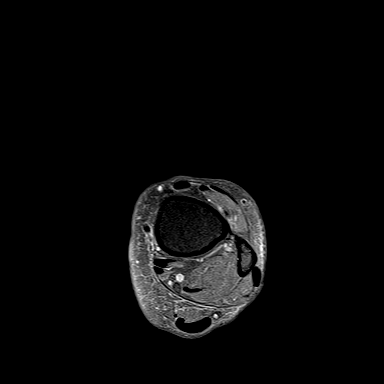

[Series 6: T1 · sagittal · 4.0mm · 0.56mm/px · 6 of 19 slices shown]
[im 1/19]
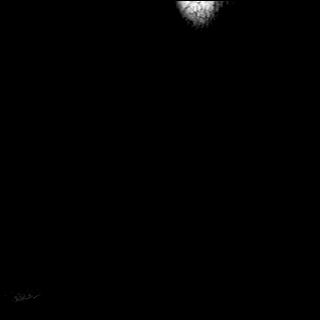
[im 4/19]
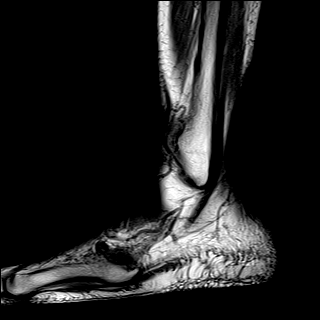
[im 8/19]
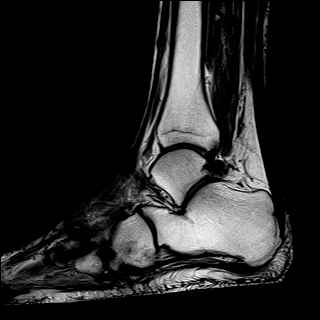
[im 11/19]
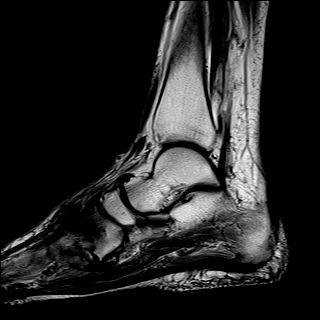
[im 15/19]
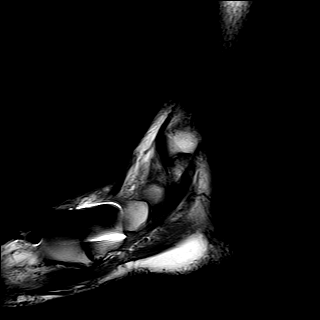
[im 19/19]
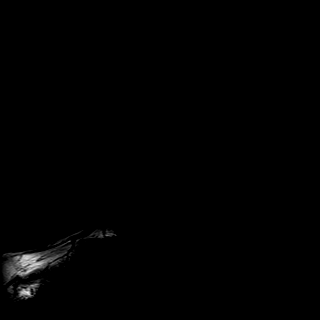

[Series 7: STIR · sagittal · 4.0mm · 0.35mm/px · 4 of 20 slices shown]
[im 1/20]
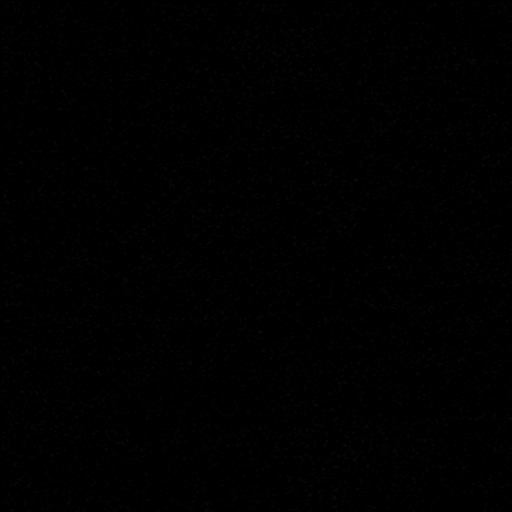
[im 4/20]
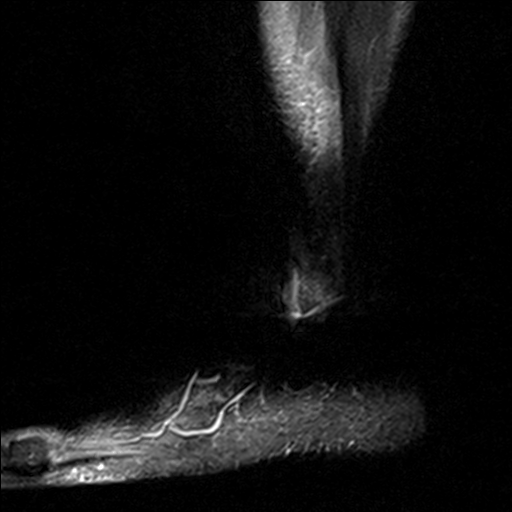
[im 8/20]
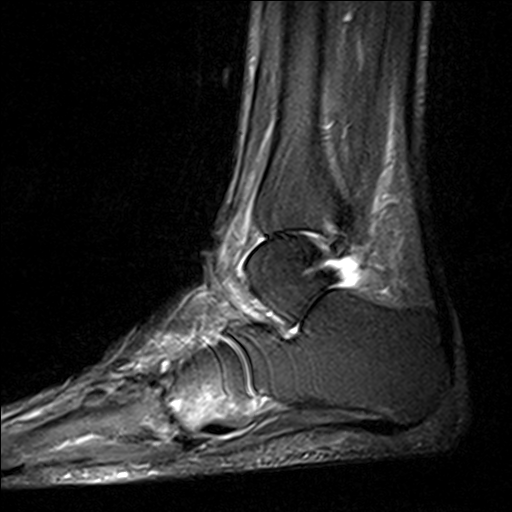
[im 12/20]
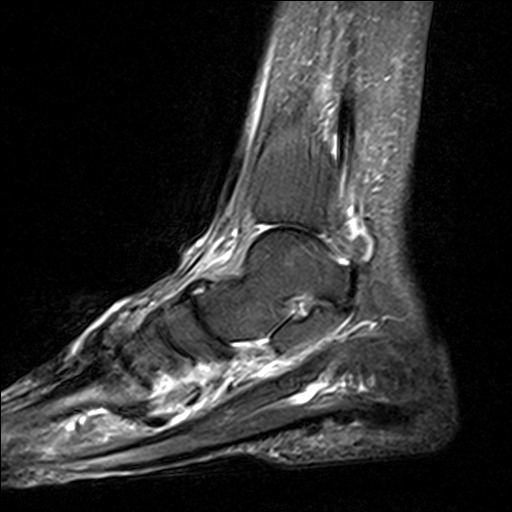

[Series 8: T2 fat-sat · coronal · 3.0mm · 0.50mm/px · 8 of 35 slices shown (2 of 2)]
[im 1/35]
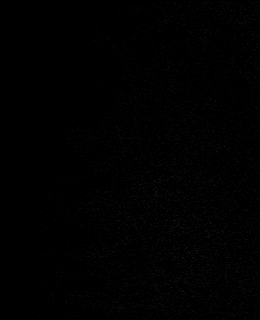
[im 4/35]
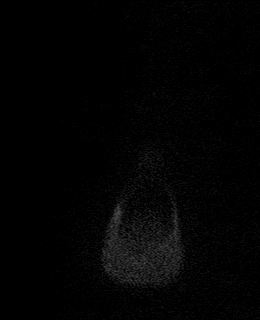
[im 12/35]
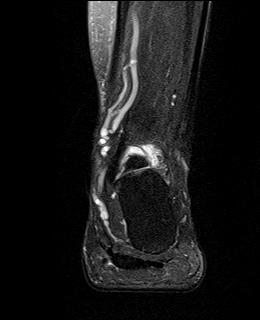
[im 16/35]
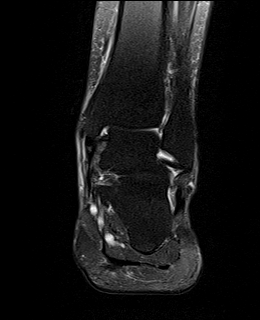
[im 19/35]
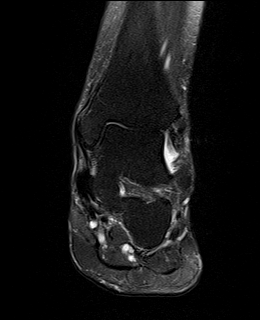
[im 23/35]
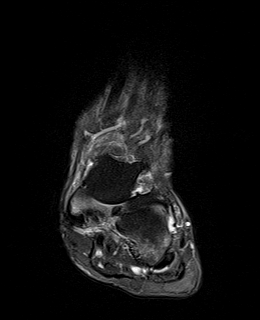
[im 31/35]
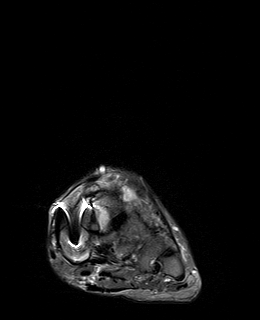
[im 35/35]
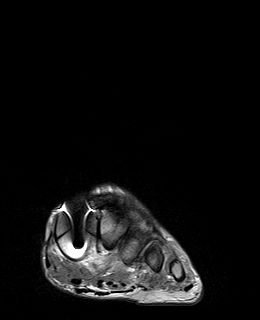

[36 of 40 positions shown; findings below may reference images not displayed]

FINDINGS: TENDONS

Peroneal: Intact.  No significant tendinopathy or tenosynovitis.

Posteromedial: Intact. No significant tendinopathy or tenosynovitis.

Anterior: Intact.  No significant tendinopathy or tenosynovitis.

Achilles: Normal

Plantar Fascia: Intact

LIGAMENTS

Lateral: Intact

Medial: Intact

CARTILAGE

Ankle Joint: Mild degenerative chondrosis and small joint effusion.
No cartilage defects or osteochondral lesions.

Subtalar Joints/Sinus Tarsi: Joint mild subtalar joint degenerative
changes. Some fluid and mild edema in sinus tarsi but the cervical
and interosseous ligaments are intact.

Bones: Significant/advanced midfoot degenerative changes, mainly at
the tarsal metatarsal joints with marked joint space narrowing,
osteophytic spurring, subchondral cystic change and subchondral
edema. Findings could be due to an inflammatory arthropathy or
neuropathic change. Moderate edema like signal abnormality in the
cuboid is likely reactive. I do not see a definite stress fracture.

Surgical changes related to a prior fusion at the base of the first
metatarsal medial cuneiform joint. No obvious complicating features.
There appears to be solid fusion.

Other: Unremarkable foot and ankle musculature.
IMPRESSION: 1. Significant/advanced midfoot degenerative changes, mainly at the
tarsometatarsal joints. Findings could be due to an inflammatory
arthropathy or neuropathic change.
2. Moderate edema like signal abnormality in the cuboid is likely
reactive. No definite stress fracture.
3. Intact medial and lateral ankle ligaments and tendons.

## 2020-01-27 ENCOUNTER — Telehealth: Payer: Self-pay | Admitting: Podiatry

## 2020-01-27 NOTE — Telephone Encounter (Signed)
Patient was returning your call

## 2020-01-27 NOTE — Telephone Encounter (Signed)
I called to go over MRI Marilyn Perez can you schedule her for 8:15 Monday morning for a steroid injection? Thanks

## 2020-01-30 ENCOUNTER — Other Ambulatory Visit: Payer: Self-pay

## 2020-01-30 ENCOUNTER — Ambulatory Visit (INDEPENDENT_AMBULATORY_CARE_PROVIDER_SITE_OTHER): Payer: Medicare Other | Admitting: Podiatry

## 2020-01-30 DIAGNOSIS — M7752 Other enthesopathy of left foot: Secondary | ICD-10-CM | POA: Diagnosis not present

## 2020-01-30 DIAGNOSIS — M19072 Primary osteoarthritis, left ankle and foot: Secondary | ICD-10-CM | POA: Diagnosis not present

## 2020-01-30 DIAGNOSIS — M1990 Unspecified osteoarthritis, unspecified site: Secondary | ICD-10-CM

## 2020-01-30 DIAGNOSIS — G8929 Other chronic pain: Secondary | ICD-10-CM

## 2020-01-30 DIAGNOSIS — M25572 Pain in left ankle and joints of left foot: Secondary | ICD-10-CM | POA: Diagnosis not present

## 2020-01-30 DIAGNOSIS — M779 Enthesopathy, unspecified: Secondary | ICD-10-CM

## 2020-01-30 NOTE — Progress Notes (Signed)
Subjective: 69 year old female presents the office today for steroid injection to the subtalar joint left foot.  She states that after the ankle injection she had relief for about 2 days.  She is not been taking any of her medications for much arthritis.  She follows up with Dr. Lenna Gilford for this.  Denies any systemic complaints such as fevers, chills, nausea, vomiting. No acute changes since last appointment, and no other complaints at this time.   MRI 01/26/2020 IMPRESSION: 1. Significant/advanced midfoot degenerative changes, mainly at the tarsometatarsal joints. Findings could be due to an inflammatory arthropathy or neuropathic change. 2. Moderate edema like signal abnormality in the cuboid is likely reactive. No definite stress fracture. 3. Intact medial and lateral ankle ligaments and tendons.   Objective: AAO x3, NAD DP/PT pulses palpable bilaterally, CRT less than 3 seconds There is tenderness diffusely to the left foot and ankle.  Particular tenderness on the anterior ankle joint on the left side as well as the lateral aspect on sinus tarsi.  Chronic pain along the Lisfranc joint as well.  There is mild edema but there is no erythema or warmth.   No pain with calf compression, swelling, warmth, erythema  Assessment: Significant arthritic changes to the left Lisfranc joint, arthritis ankle, subtalar joint  Plan: -All treatment options discussed with the patient including all alternatives, risks, complications.  -Sterile injection was formed to the subtalar joint today.  See procedure note below.  Do this is more of a diagnostic test as well.  We did discuss surgical options today as well. -She has not been taking any medications for rheumatoid arthritis.  She states that she can start back today given the MRI findings likely inflammatory arthritis.  I will also send over the report to Dr. Lenna Gilford. -Patient encouraged to call the office with any questions, concerns, change in symptoms.    Procedure: Injection Intermediate Joint Discussed alternatives, risks, complications and verbal consent was obtained.  Location: LEFT subtalar joint  Skin Prep: Betadine Injectate: 0.5cc 0.5% marcaine plain, 0.5 cc 2% lidocaine plain and, 1 cc kenalog 10. Disposition: Patient tolerated procedure well. Injection site dressed with a band-aid.  Post-injection care was discussed and return precautions discussed.    Trula Slade DPM

## 2020-02-01 DIAGNOSIS — Z23 Encounter for immunization: Secondary | ICD-10-CM | POA: Diagnosis not present

## 2020-02-01 DIAGNOSIS — E538 Deficiency of other specified B group vitamins: Secondary | ICD-10-CM | POA: Diagnosis not present

## 2020-02-06 ENCOUNTER — Ambulatory Visit: Payer: Medicare Other | Admitting: Podiatry

## 2020-02-09 ENCOUNTER — Other Ambulatory Visit: Payer: Self-pay

## 2020-02-09 ENCOUNTER — Ambulatory Visit (INDEPENDENT_AMBULATORY_CARE_PROVIDER_SITE_OTHER): Payer: Medicare Other | Admitting: Podiatry

## 2020-02-09 DIAGNOSIS — G8929 Other chronic pain: Secondary | ICD-10-CM | POA: Diagnosis not present

## 2020-02-09 DIAGNOSIS — M779 Enthesopathy, unspecified: Secondary | ICD-10-CM | POA: Diagnosis not present

## 2020-02-09 DIAGNOSIS — M19072 Primary osteoarthritis, left ankle and foot: Secondary | ICD-10-CM

## 2020-02-09 DIAGNOSIS — M25572 Pain in left ankle and joints of left foot: Secondary | ICD-10-CM

## 2020-02-09 NOTE — Patient Instructions (Signed)

## 2020-02-13 DIAGNOSIS — M0609 Rheumatoid arthritis without rheumatoid factor, multiple sites: Secondary | ICD-10-CM | POA: Diagnosis not present

## 2020-02-14 NOTE — Progress Notes (Signed)
Subjective: 69 year old female presents the office today for follow evaluation of arthritis, pain to her left foot.  She did discuss going back on oral medications for the arthritis but they decided not to do so.  She states the injection helped quite a bit on the left foot on the subtalar joint we did this last appointment.  She wants to discuss surgical options at this point.  Denies any systemic complaints such as fevers, chills, nausea, vomiting. No acute changes since last appointment, and no other complaints at this time.   Objective: AAO x3, NAD DP/PT pulses palpable bilaterally, CRT less than 3 seconds Mild discomfort along the sinus tarsi on the left foot.  She states that when she is on uneven ground when she has lateral discomfort as well.  Still at this diffuse tenderness the dorsal midfoot.  Mild discomfort anterior ankle joint. No pain with calf compression, swelling, warmth, erythema  Assessment: 69 year old female with arthritis, capsulitis  Plan: -All treatment options discussed with the patient including all alternatives, risks, complications.  -We had a long discussion for regards to both conservative as well as surgical treatment options.  At this time she wants to discuss surgery because this has been inhibiting her quality of life.  As a steroid injection helped significantly to the sinus tarsi, subtalar joint recommended subtalar arthrodesis and ankle joint arthroscopy.  We discussed the surgical's postoperative course. -The incision placement as well as the postoperative course was discussed with the patient. I discussed risks of the surgery which include, but not limited to, infection, bleeding, pain, swelling, need for further surgery, delayed or nonhealing, painful or ugly scar, numbness or sensation changes, over/under correction, recurrence, transfer lesions, further deformity, hardware failure, DVT/PE, loss of toe/foot. Patient understands these risks and wishes to  proceed with surgery. The surgical consent was reviewed with the patient all 3 pages were signed. No promises or guarantees were given to the outcome of the procedure. All questions were answered to the best of my ability. Before the surgery the patient was encouraged to call the office if there is any further questions. The surgery will be performed at the [facility] on an outpatient basis. -Patient encouraged to call the office with any questions, concerns, change in symptoms.   Trula Slade DPM

## 2020-02-26 DIAGNOSIS — L989 Disorder of the skin and subcutaneous tissue, unspecified: Secondary | ICD-10-CM | POA: Diagnosis not present

## 2020-02-26 DIAGNOSIS — Z9181 History of falling: Secondary | ICD-10-CM | POA: Diagnosis not present

## 2020-02-26 DIAGNOSIS — F17210 Nicotine dependence, cigarettes, uncomplicated: Secondary | ICD-10-CM | POA: Diagnosis not present

## 2020-02-29 ENCOUNTER — Encounter: Payer: Self-pay | Admitting: Podiatry

## 2020-02-29 ENCOUNTER — Other Ambulatory Visit: Payer: Self-pay | Admitting: Podiatry

## 2020-02-29 DIAGNOSIS — M25572 Pain in left ankle and joints of left foot: Secondary | ICD-10-CM | POA: Diagnosis not present

## 2020-02-29 DIAGNOSIS — M65872 Other synovitis and tenosynovitis, left ankle and foot: Secondary | ICD-10-CM | POA: Diagnosis not present

## 2020-02-29 DIAGNOSIS — M19072 Primary osteoarthritis, left ankle and foot: Secondary | ICD-10-CM | POA: Diagnosis not present

## 2020-02-29 MED ORDER — OXYCODONE-ACETAMINOPHEN 5-325 MG PO TABS: 1.0000 | ORAL_TABLET | Freq: Four times a day (QID) | ORAL | 0 refills | Status: DC | PRN

## 2020-02-29 MED ORDER — CLINDAMYCIN HCL 300 MG PO CAPS: 300.0000 mg | ORAL_CAPSULE | Freq: Three times a day (TID) | ORAL | 0 refills | Status: DC

## 2020-02-29 MED ORDER — PROMETHAZINE HCL 25 MG PO TABS: 25.0000 mg | ORAL_TABLET | Freq: Three times a day (TID) | ORAL | 0 refills | Status: DC | PRN

## 2020-02-29 NOTE — Progress Notes (Signed)
Postop medications sent 

## 2020-03-01 ENCOUNTER — Telehealth: Payer: Self-pay | Admitting: *Deleted

## 2020-03-01 NOTE — Telephone Encounter (Signed)
Called patient and stated that I was calling to see how patient was doing after having surgery with Dr Jacqualyn Posey yesterday and the patient is in some pain and has already taken some pain medicine and feels like the ankle bone is awake and can wiggle the toes and there is not any fever or chills, no nausea and I stated to the patient that she can use the ice pack under the knee and that will help with the swelling and to call the office if any concerns or questions and that we would see the patient in the office for first post op visit and patient wanted to get a knee scooter (which I have already sent an order to [employer]) cause patient states that she doesn't do well with crutches and I stated that we would see patient next week for post op visit. Lattie Haw

## 2020-03-05 ENCOUNTER — Ambulatory Visit (INDEPENDENT_AMBULATORY_CARE_PROVIDER_SITE_OTHER): Payer: Medicare Other

## 2020-03-05 ENCOUNTER — Ambulatory Visit (INDEPENDENT_AMBULATORY_CARE_PROVIDER_SITE_OTHER): Payer: Medicare Other | Admitting: Podiatry

## 2020-03-05 ENCOUNTER — Other Ambulatory Visit: Payer: Self-pay

## 2020-03-05 DIAGNOSIS — M779 Enthesopathy, unspecified: Secondary | ICD-10-CM

## 2020-03-05 DIAGNOSIS — M25572 Pain in left ankle and joints of left foot: Secondary | ICD-10-CM

## 2020-03-05 DIAGNOSIS — M19072 Primary osteoarthritis, left ankle and foot: Secondary | ICD-10-CM

## 2020-03-05 DIAGNOSIS — G8929 Other chronic pain: Secondary | ICD-10-CM

## 2020-03-05 MED ORDER — HYDROCODONE-ACETAMINOPHEN 5-325 MG PO TABS: 1.0000 | ORAL_TABLET | Freq: Four times a day (QID) | ORAL | 0 refills | Status: DC | PRN

## 2020-03-06 ENCOUNTER — Telehealth: Payer: Self-pay

## 2020-03-06 NOTE — Progress Notes (Signed)
Subjective: Marilyn Perez is a 69 y.o. is seen today in office s/p left foot subtalar joint arthrodesis, ankle arthroscopy preformed on 02/29/2020.  She said that she is taking half of Vicodin for pain.  She cannot tolerate Percocet.  She has been nonweightbearing but this has been difficult for her.  Denies any systemic complaints such as fevers, chills, nausea, vomiting. No calf pain, chest pain, shortness of breath.   Objective: General: No acute distress, AAOx3  DP/PT pulses palpable 2/4, CRT < 3 sec to all digits.  Protective sensation intact. Motor function intact.  Left foot: Incision is well coapted without any evidence of dehiscence with sutures, staples intact. There is no surrounding erythema, ascending cellulitis, fluctuance, crepitus, malodor, drainage/purulence. There is mild edema around the surgical site. There is mild pain along the surgical site.  There is ecchymosis present.  There is no evidence of infection or dehiscence of the wound. No other areas of tenderness to bilateral lower extremities.  No other open lesions or pre-ulcerative lesions.  No pain with calf compression, swelling, warmth, erythema.   Assessment and Plan:  Status post left foot surgery, doing well with no complications   -Treatment options discussed including all alternatives, risks, and complications -X-rays obtained reviewed.  Status post subtalar joint arthrodesis with hardware intact.  There is no evidence of acute fracture. -Antibiotic ointment was applied followed by dressing.  Keep the dressing clean, dry, intact.  I was then put her into a cast today but given the swelling I held off on this.  Encouraged elevation.  She has a tall cam boot at home that I want her to wear.  Wear this at all times. -Continue nonweightbearing -Knee scooter reordered today -I [facility] contacted [facility] about physical therapy to come to the house. -Ice/elevation -Pain medication as needed. -Monitor  for any clinical signs or symptoms of infection and DVT/PE and directed to call the office immediately should any occur or go to the ER. -Follow-up as scheduled or sooner if any problems arise. In the meantime, encouraged to call the office with any questions, concerns, change in symptoms.   Celesta Gentile, DPM

## 2020-03-06 NOTE — Telephone Encounter (Signed)
Placed order for knee scooter with [employer]. Patient notified that someone from [facility] would be contacting her.

## 2020-03-07 DIAGNOSIS — Z981 Arthrodesis status: Secondary | ICD-10-CM | POA: Diagnosis not present

## 2020-03-07 DIAGNOSIS — G8929 Other chronic pain: Secondary | ICD-10-CM | POA: Diagnosis not present

## 2020-03-07 DIAGNOSIS — Z4789 Encounter for other orthopedic aftercare: Secondary | ICD-10-CM | POA: Diagnosis not present

## 2020-03-07 DIAGNOSIS — M779 Enthesopathy, unspecified: Secondary | ICD-10-CM | POA: Diagnosis not present

## 2020-03-07 DIAGNOSIS — R262 Difficulty in walking, not elsewhere classified: Secondary | ICD-10-CM | POA: Diagnosis not present

## 2020-03-07 DIAGNOSIS — M19072 Primary osteoarthritis, left ankle and foot: Secondary | ICD-10-CM | POA: Diagnosis not present

## 2020-03-08 ENCOUNTER — Telehealth: Payer: Self-pay | Admitting: Podiatry

## 2020-03-08 NOTE — Telephone Encounter (Signed)
Pt called stating she is having discomfort with her surgical foot. She says the boot is rubbing against her ankle near the heel and causing pain. She would like to know if you had any recommendations. Please advise.

## 2020-03-08 NOTE — Telephone Encounter (Signed)
I called patient back. The CAM boot is rubbing the side. She is going to try adding some padding down on the side of the boot. If no improvement then she will call tomorrow morning and I will have her come in for a cast.  Otherwise she is doing better. She ordered a knee scooter off of [employer] and home health came out today.

## 2020-03-09 ENCOUNTER — Encounter: Payer: Self-pay | Admitting: Podiatry

## 2020-03-09 ENCOUNTER — Other Ambulatory Visit: Payer: Self-pay

## 2020-03-09 ENCOUNTER — Ambulatory Visit (INDEPENDENT_AMBULATORY_CARE_PROVIDER_SITE_OTHER): Payer: Medicare Other | Admitting: Podiatry

## 2020-03-09 DIAGNOSIS — M19072 Primary osteoarthritis, left ankle and foot: Secondary | ICD-10-CM | POA: Diagnosis not present

## 2020-03-09 DIAGNOSIS — Z4789 Encounter for other orthopedic aftercare: Secondary | ICD-10-CM | POA: Diagnosis not present

## 2020-03-09 DIAGNOSIS — G8929 Other chronic pain: Secondary | ICD-10-CM | POA: Diagnosis not present

## 2020-03-09 DIAGNOSIS — Z981 Arthrodesis status: Secondary | ICD-10-CM | POA: Diagnosis not present

## 2020-03-09 DIAGNOSIS — Z9889 Other specified postprocedural states: Secondary | ICD-10-CM | POA: Diagnosis not present

## 2020-03-09 DIAGNOSIS — M25572 Pain in left ankle and joints of left foot: Secondary | ICD-10-CM | POA: Diagnosis not present

## 2020-03-09 DIAGNOSIS — M779 Enthesopathy, unspecified: Secondary | ICD-10-CM | POA: Diagnosis not present

## 2020-03-09 DIAGNOSIS — R262 Difficulty in walking, not elsewhere classified: Secondary | ICD-10-CM | POA: Diagnosis not present

## 2020-03-09 NOTE — Progress Notes (Signed)
Subjective:  Patient ID: Marilyn Perez, female    DOB: 04/18/1951,  MRN: [id number]  No chief complaint on file.   DOS: 02/29/2020 Procedure: Status post left foot subtalar joint arthrodesis with ankle arthroscopy performed on 02/29/2020  69 y.o. female returns for post-op check.  Patient is doing well.  Her pain is well under control.  She presented today for the cam boot rubbing against the side of her incision.  She wondered if she could go in a cast.  She was seen today for cast application.  Review of Systems: Negative except as noted in the HPI. Denies N/V/F/Ch.  Past Medical History:  Diagnosis Date  . Anxiety   . Arthritis    ra  . Hyperlipidemia 10/21/2014  . Osteopenia     Current Outpatient Medications:  .  celecoxib (CELEBREX) 200 MG capsule, Take 200 mg by mouth 2 (two) times daily., Disp: , Rfl:  .  CHANTIX STARTING MONTH PAK 0.5 MG X 11 & 1 MG X 42 tablet, USE AS DIRECTED FOR 30 DAYS, Disp: , Rfl:  .  clindamycin (CLEOCIN) 300 MG capsule, Take 1 capsule (300 mg total) by mouth 3 (three) times daily., Disp: 9 capsule, Rfl: 0 .  Cyanocobalamin (VITAMIN B-12 IJ), Inject as directed every 30 (thirty) days., Disp: , Rfl:  .  DULoxetine (CYMBALTA) 60 MG capsule, Take 60 mg by mouth daily., Disp: , Rfl:  .  HYDROcodone-acetaminophen (NORCO/VICODIN) 5-325 MG tablet, TAKE 1 2 TO 1 (ONE HALF TO ONE) TABLET BY MOUTH ONCE DAILY AS NEEDED, Disp: , Rfl:  .  HYDROcodone-acetaminophen (NORCO/VICODIN) 5-325 MG tablet, Take 1 tablet by mouth every 6 (six) hours as needed., Disp: 20 tablet, Rfl: 0 .  leflunomide (ARAVA) 20 MG tablet, Take 20 mg by mouth daily., Disp: , Rfl:  .  Misc Natural Products (MIDNITE PO), Take 2 tablets by mouth at bedtime., Disp: , Rfl:  .  oxyCODONE-acetaminophen (PERCOCET/ROXICET) 5-325 MG tablet, Take 1-2 tablets by mouth every 6 (six) hours as needed for severe pain., Disp: 30 tablet, Rfl: 0 .  promethazine (PHENERGAN) 25 MG tablet, Take 1 tablet (25  mg total) by mouth every 8 (eight) hours as needed for nausea or vomiting., Disp: 20 tablet, Rfl: 0 .  rOPINIRole (REQUIP) 0.5 MG tablet, Take 1 mg by mouth at bedtime., Disp: , Rfl:  .  SF 5000 PLUS 1.1 % CREA dental cream, USE ONCE DAILY AT BEDTIME IN PLACE OF REGULAR TOOTHPASTE. BRUSH FOR 2 MINUTES AND SPIT. DO NOT RINSE, Disp: , Rfl:  .  traMADol (ULTRAM) 50 MG tablet, , Disp: , Rfl:   Social History   Tobacco Use  Smoking Status Current Every Day Smoker  . Packs/day: 0.50  . Years: 6.00  . Pack years: 3.00  . Types: Cigarettes  Smokeless Tobacco Never Used    Allergies  Allergen Reactions  . Penicillins Swelling and Rash    "facial swelling"  . Remicade [Infliximab] Swelling and Rash  . Sulfa Antibiotics Rash   Objective:  There were no vitals filed for this visit. There is no height or weight on file to calculate BMI. Constitutional Well developed. Well nourished.  Vascular Foot warm and well perfused. Capillary refill normal to all digits.   Neurologic Normal speech. Oriented to person, place, and time. Epicritic sensation to light touch grossly present bilaterally.  Dermatologic  the bandages were not removed as they were placed yesterday.  No calf pain noted.  Motor or sensory function is intact.  Orthopedic: Tenderness to palpation noted about the surgical site.   Radiographs: None Assessment:   1. Arthritis of ankle, left   2. Chronic pain of left ankle   3. Status post left foot surgery    Plan:  Patient was evaluated and treated and all questions answered.  S/p foot surgery left -Progressing as expected post-operatively. -XR: None -WB Status: Nonweightbearing to the left lower extremity in knee scooter -Sutures: Bandages were not removed.  Cast was applied in standard technique. -Medications: None -Foot redressed.  No follow-ups on file.

## 2020-03-12 DIAGNOSIS — Z4789 Encounter for other orthopedic aftercare: Secondary | ICD-10-CM | POA: Diagnosis not present

## 2020-03-12 DIAGNOSIS — M19072 Primary osteoarthritis, left ankle and foot: Secondary | ICD-10-CM | POA: Diagnosis not present

## 2020-03-12 DIAGNOSIS — R262 Difficulty in walking, not elsewhere classified: Secondary | ICD-10-CM | POA: Diagnosis not present

## 2020-03-12 DIAGNOSIS — Z981 Arthrodesis status: Secondary | ICD-10-CM | POA: Diagnosis not present

## 2020-03-12 DIAGNOSIS — G8929 Other chronic pain: Secondary | ICD-10-CM | POA: Diagnosis not present

## 2020-03-12 DIAGNOSIS — M779 Enthesopathy, unspecified: Secondary | ICD-10-CM | POA: Diagnosis not present

## 2020-03-15 ENCOUNTER — Other Ambulatory Visit: Payer: Self-pay

## 2020-03-15 ENCOUNTER — Ambulatory Visit (INDEPENDENT_AMBULATORY_CARE_PROVIDER_SITE_OTHER): Payer: Medicare Other | Admitting: Podiatry

## 2020-03-15 DIAGNOSIS — G8929 Other chronic pain: Secondary | ICD-10-CM | POA: Diagnosis not present

## 2020-03-15 DIAGNOSIS — M25572 Pain in left ankle and joints of left foot: Secondary | ICD-10-CM | POA: Diagnosis not present

## 2020-03-15 DIAGNOSIS — M19072 Primary osteoarthritis, left ankle and foot: Secondary | ICD-10-CM | POA: Diagnosis not present

## 2020-03-16 ENCOUNTER — Ambulatory Visit (INDEPENDENT_AMBULATORY_CARE_PROVIDER_SITE_OTHER): Payer: Medicare Other | Admitting: Podiatry

## 2020-03-16 DIAGNOSIS — Z4789 Encounter for other orthopedic aftercare: Secondary | ICD-10-CM

## 2020-03-18 ENCOUNTER — Encounter: Payer: Self-pay | Admitting: Podiatry

## 2020-03-18 NOTE — Progress Notes (Signed)
  Subjective:  Patient ID: Marilyn Perez, female    DOB: January 31, 1951,  MRN: [id number]  Chief Complaint  Patient presents with  . Post-op Problem    Pt states "I feel like my cast is loose"    69 y.o. female presents with the above complaint. History confirmed with patient.   Objective:  Physical Exam: Cast with some loosening proximally, it does not piston or slide down the leg.  Normal sensation and vascular exam  Assessment:  No diagnosis found.   Plan:  Patient was evaluated and treated and all questions answered.  Advised that it be best to leave the cast on if possible.  She is not having pain and she is okay with this plan.  No follow-ups on file.

## 2020-03-19 NOTE — Progress Notes (Signed)
Subjective: Marilyn Perez is a 69 y.o. is seen today in office s/p left foot subtalar joint arthrodesis, ankle arthroscopy preformed on 02/29/2020.  Presents today for possible suture removal.  She states the callus felt much better than the boot.  She states that she is feeling better overall.  Denies any systemic complaints such as fevers, chills, nausea, vomiting. No calf pain, chest pain, shortness of breath.   Objective: General: No acute distress, AAOx3  DP/PT pulses palpable 2/4, CRT < 3 sec to all digits.  Protective sensation intact. Motor function intact.  Left foot: Incision is well coapted without any evidence of dehiscence with sutures intact.  There is no surrounding erythema, ascending cellulitis there is no drainage or pus any signs of infection noted today.  Mild edema.  No other open lesions or pre-ulcerative lesions.  No pain with calf compression, swelling, warmth, erythema.   Assessment and Plan:  Status post left foot surgery, doing well with no complications   -Treatment options discussed including all alternatives, risks, and complications -Cast was removed.  The sutures removed in the incision remained well coapted.  Antibiotic ointment and dressings applied.  Well-padded below-knee fiberglass cast was applied making sure to pad all bony prominences. -Continue nonweightbearing -Ice/elevation -Pain medication as needed. -Monitor for any clinical signs or symptoms of infection and DVT/PE and directed to call the office immediately should any occur or go to the ER. -Follow-up as scheduled or sooner if any problems arise. In the meantime, encouraged to call the office with any questions, concerns, change in symptoms.   Celesta Gentile, DPM

## 2020-03-26 ENCOUNTER — Other Ambulatory Visit: Payer: Self-pay

## 2020-03-26 ENCOUNTER — Ambulatory Visit (INDEPENDENT_AMBULATORY_CARE_PROVIDER_SITE_OTHER): Payer: Medicare Other | Admitting: Podiatry

## 2020-03-26 DIAGNOSIS — M25572 Pain in left ankle and joints of left foot: Secondary | ICD-10-CM

## 2020-03-26 DIAGNOSIS — M19072 Primary osteoarthritis, left ankle and foot: Secondary | ICD-10-CM

## 2020-03-26 DIAGNOSIS — G8929 Other chronic pain: Secondary | ICD-10-CM

## 2020-04-02 ENCOUNTER — Encounter: Payer: Medicare Other | Admitting: Podiatry

## 2020-04-02 NOTE — Progress Notes (Signed)
Subjective: TWANA WILEMAN is a 69 y.o. is seen today in office s/p left foot subtalar joint arthrodesis, ankle arthroscopy preformed on 02/29/2020.  States that she is doing better but she is eager to get out of the cast this is causing discomfort.  She is been nonweightbearing.  Denies any recent injury or falls or changes otherwise.  No other concerns today. Denies any systemic complaints such as fevers, chills, nausea, vomiting. No calf pain, chest pain, shortness of breath.   Objective: General: No acute distress, AAOx3  DP/PT pulses palpable 2/4, CRT < 3 sec to all digits.  Protective sensation intact. Motor function intact.  Left foot: Incision is well coapted without any evidence of dehiscence and scars forming.  There is no open lesions identified there is no erythema, ascending cellulitis there is no drainage or pus along the incision site.  Minimal discomfort.  Arthrodesis site appears to be stable. No other open lesions or pre-ulcerative lesions.  No pain with calf compression, swelling, warmth, erythema.   Assessment and Plan:  Status post left foot surgery, doing well with no complications   -Treatment options discussed including all alternatives, risks, and complications -Overall she is doing better.  Today I did place into a cam boot but remain nonweightbearing.  Discussed ankle range of motion exercises that she can do.  She can wash the area soap and water and apply a similar bandage.  Wear cam boot at all times. -Ice/elevation -Pain medication as needed. -Monitor for any clinical signs or symptoms of infection and DVT/PE and directed to call the office immediately should any occur or go to the ER. -Follow-up as scheduled or sooner if any problems arise. In the meantime, encouraged to call the office with any questions, concerns, change in symptoms.   *X-ray next appointment   Celesta Gentile, DPM

## 2020-04-03 DIAGNOSIS — M0609 Rheumatoid arthritis without rheumatoid factor, multiple sites: Secondary | ICD-10-CM | POA: Diagnosis not present

## 2020-04-06 DIAGNOSIS — Z4789 Encounter for other orthopedic aftercare: Secondary | ICD-10-CM | POA: Diagnosis not present

## 2020-04-12 ENCOUNTER — Other Ambulatory Visit: Payer: Self-pay

## 2020-04-12 ENCOUNTER — Ambulatory Visit (INDEPENDENT_AMBULATORY_CARE_PROVIDER_SITE_OTHER): Payer: Medicare Other

## 2020-04-12 ENCOUNTER — Ambulatory Visit (INDEPENDENT_AMBULATORY_CARE_PROVIDER_SITE_OTHER): Payer: Medicare Other | Admitting: Podiatry

## 2020-04-12 DIAGNOSIS — M779 Enthesopathy, unspecified: Secondary | ICD-10-CM

## 2020-04-12 DIAGNOSIS — M21962 Unspecified acquired deformity of left lower leg: Secondary | ICD-10-CM

## 2020-04-12 DIAGNOSIS — M19072 Primary osteoarthritis, left ankle and foot: Secondary | ICD-10-CM

## 2020-04-17 NOTE — Progress Notes (Signed)
Subjective: Marilyn Perez is a 69 y.o. is seen today in office s/p left foot subtalar joint arthrodesis, ankle arthroscopy preformed on 02/29/2020.  She states that she is doing great.  She has no new concerns.  She still been nonweightbearing in the cam boot.  She denies any fevers, chills, nausea, vomiting.  No calf pain, chest pain or shortness of breath.    Objective: General: No acute distress, AAOx3  DP/PT pulses palpable 2/4, CRT < 3 sec to all digits.  Protective sensation intact. Motor function intact.  Left foot: Incision is well coapted without any evidence of dehiscence and scars forming.  There is some skin present which look like it may have come out some but there is no drainage or pus, surrounding erythema, ascending cellulitis.  No fluctuation crepitation.  There is no malodor.  Arthrodesis site is stable.  Mild discomfort on the surgical site.  Incision to the posterior heel is coapted and scar is formed. No other open lesions or pre-ulcerative lesions.  No pain with calf compression, swelling, warmth, erythema.   Assessment and Plan:  Status post left foot surgery, doing well   -Treatment options discussed including all alternatives, risks, and complications -X-rays obtained and reviewed.  Hardware intact status post subtalar arthrodesis.  Radiolucency still evident on the arthrodesis site. -At this time postoperatively start to transition to partial weightbearing in the cam boot.  Continue ice elevation.  If there is any increase in discomfort to return with him nonweightbearing. -Pain medication as needed. -Monitor for any clinical signs or symptoms of infection and DVT/PE and directed to call the office immediately should any occur or go to the ER. -Follow-up as scheduled or sooner if any problems arise. In the meantime, encouraged to call the office with any questions, concerns, change in symptoms.   *X-ray next appointment   Celesta Gentile, DPM

## 2020-04-26 ENCOUNTER — Other Ambulatory Visit: Payer: Self-pay

## 2020-04-26 ENCOUNTER — Encounter: Payer: Self-pay | Admitting: Podiatry

## 2020-04-26 ENCOUNTER — Ambulatory Visit (INDEPENDENT_AMBULATORY_CARE_PROVIDER_SITE_OTHER): Payer: Medicare Other | Admitting: Podiatry

## 2020-04-26 ENCOUNTER — Ambulatory Visit (INDEPENDENT_AMBULATORY_CARE_PROVIDER_SITE_OTHER): Payer: Medicare Other

## 2020-04-26 DIAGNOSIS — M19072 Primary osteoarthritis, left ankle and foot: Secondary | ICD-10-CM | POA: Diagnosis not present

## 2020-04-26 DIAGNOSIS — M779 Enthesopathy, unspecified: Secondary | ICD-10-CM

## 2020-05-01 DIAGNOSIS — Z79899 Other long term (current) drug therapy: Secondary | ICD-10-CM | POA: Diagnosis not present

## 2020-05-01 DIAGNOSIS — Z111 Encounter for screening for respiratory tuberculosis: Secondary | ICD-10-CM | POA: Diagnosis not present

## 2020-05-01 DIAGNOSIS — R5383 Other fatigue: Secondary | ICD-10-CM | POA: Diagnosis not present

## 2020-05-01 DIAGNOSIS — M0609 Rheumatoid arthritis without rheumatoid factor, multiple sites: Secondary | ICD-10-CM | POA: Diagnosis not present

## 2020-05-02 NOTE — Progress Notes (Signed)
Subjective: Marilyn Perez is a 69 y.o. is seen today in office s/p left foot subtalar joint arthrodesis, ankle arthroscopy preformed on 02/29/2020.  She said she has been doing okay.  She has times where she has plantar foot and she gets discomfort over time she feels okay.  She does wear the cam boot.  Denies any recent injury or falls or changes otherwise. She denies any fevers, chills, nausea, vomiting.  No calf pain, chest pain or shortness of breath.    Objective: General: No acute distress, AAOx3  DP/PT pulses palpable 2/4, CRT < 3 sec to all digits.  Protective sensation intact. Motor function intact.  Left foot: Incision is well coapted without any evidence of dehiscence and scars have formed.  There is minimal edema to the surgical site there is no erythema or warmth.  There is no tenderness palpation of the surgical site today.  No pain in ankle joint with ankle joint range of motion. No other open lesions or pre-ulcerative lesions.  No pain with calf compression, swelling, warmth, erythema.   Assessment and Plan:  Status post left foot surgery  -Treatment options discussed including all alternatives, risks, and complications -X-rays obtained reviewed.  Radiolucency still present on the subtalar joint however hardware intact without any complicating factors.  No evidence of acute fracture. -Discussed with her to remain in a cam boot, ice elevate.  She can do weightbearing as tolerated for short distances. -Monitor for any clinical signs or symptoms of infection and DVT/PE and directed to call the office immediately should any occur or go to the ER. -Follow-up as scheduled or sooner if any problems arise. In the meantime, encouraged to call the office with any questions, concerns, change in symptoms.   *X-ray next appointment   Ovid Curd, DPM

## 2020-05-15 ENCOUNTER — Ambulatory Visit (INDEPENDENT_AMBULATORY_CARE_PROVIDER_SITE_OTHER): Payer: Medicare Other | Admitting: Podiatry

## 2020-05-15 ENCOUNTER — Other Ambulatory Visit: Payer: Self-pay

## 2020-05-15 ENCOUNTER — Ambulatory Visit (INDEPENDENT_AMBULATORY_CARE_PROVIDER_SITE_OTHER): Payer: Medicare Other

## 2020-05-15 DIAGNOSIS — M19072 Primary osteoarthritis, left ankle and foot: Secondary | ICD-10-CM

## 2020-05-15 DIAGNOSIS — E559 Vitamin D deficiency, unspecified: Secondary | ICD-10-CM

## 2020-05-16 DIAGNOSIS — E559 Vitamin D deficiency, unspecified: Secondary | ICD-10-CM | POA: Diagnosis not present

## 2020-05-17 ENCOUNTER — Other Ambulatory Visit: Payer: Self-pay | Admitting: Podiatry

## 2020-05-17 ENCOUNTER — Telehealth: Payer: Self-pay | Admitting: *Deleted

## 2020-05-17 LAB — VITAMIN D 25 HYDROXY (VIT D DEFICIENCY, FRACTURES): Vit D, 25-Hydroxy: 19.7 ng/mL — ABNORMAL LOW (ref 30.0–100.0)

## 2020-05-17 MED ORDER — VITAMIN D (ERGOCALCIFEROL) 1.25 MG (50000 UNIT) PO CAPS: 50000.0000 [IU] | ORAL_CAPSULE | ORAL | 0 refills | Status: DC

## 2020-05-17 NOTE — Telephone Encounter (Signed)
Called and spoke with the patient and the patient stated that she had seen the results and her husband has went to pick it up from the pharmacy. Marilyn Perez

## 2020-05-17 NOTE — Telephone Encounter (Signed)
-----   Message from Trula Slade, DPM sent at 05/17/2020  9:27 AM EST ----- Lattie Haw- please let her know that the vitamin D was low and I have sent 50,000 units weekly to the pharmacy. Will recheck in about 1 month.

## 2020-05-21 NOTE — Progress Notes (Signed)
Subjective: Marilyn Perez is a 70 y.o. is seen today in office s/p left foot subtalar joint arthrodesis, ankle arthroscopy preformed on 02/29/2020.  She states that she does put some weight on her foot at times in the cam boot but she wears the cam boot at all times.  She denies any recent injury or falls and she has no new concerns today.   Objective: General: No acute distress, AAOx3  DP/PT pulses palpable 2/4, CRT < 3 sec to all digits.  Protective sensation intact. Motor function intact.  Left foot: Incision is well coapted without any evidence of dehiscence and scars have formed.  There is no significant discomfort to palpation on exam today.  She states that if she stands and puts weight on her foot for too long she gets discomfort.  She also gets swelling but there is minimal swelling today.  No other open lesions or pre-ulcerative lesions.  No pain with calf compression, swelling, warmth, erythema.   Assessment and Plan:  Status post left foot surgery  -Treatment options discussed including all alternatives, risks, and complications -X-rays obtained and reviewed.  Radiolucency is still noted across the subtalar joint with some mild increased consolidation across the posterior aspect of the posterior facet.  Evidence of acute fracture. -Given the delayed healing I will order vitamin D level.  Continue cam boot and partial weightbearing for now.  I will see her back next couple weeks and at that point if no significant improvement we will likely order a bone stimulator.  Trula Slade DPM

## 2020-05-29 DIAGNOSIS — M0609 Rheumatoid arthritis without rheumatoid factor, multiple sites: Secondary | ICD-10-CM | POA: Diagnosis not present

## 2020-05-31 ENCOUNTER — Other Ambulatory Visit: Payer: Self-pay

## 2020-05-31 ENCOUNTER — Ambulatory Visit (INDEPENDENT_AMBULATORY_CARE_PROVIDER_SITE_OTHER): Payer: Medicare Other | Admitting: Podiatry

## 2020-05-31 ENCOUNTER — Ambulatory Visit (HOSPITAL_COMMUNITY)
Admission: RE | Admit: 2020-05-31 | Discharge: 2020-05-31 | Disposition: A | Discharge status: Home / Self Care | Payer: Medicare Other | Source: Ambulatory Visit | Attending: Podiatry | Admitting: Podiatry

## 2020-05-31 ENCOUNTER — Ambulatory Visit (INDEPENDENT_AMBULATORY_CARE_PROVIDER_SITE_OTHER): Payer: Medicare Other

## 2020-05-31 DIAGNOSIS — E559 Vitamin D deficiency, unspecified: Secondary | ICD-10-CM

## 2020-05-31 DIAGNOSIS — R609 Edema, unspecified: Secondary | ICD-10-CM | POA: Diagnosis not present

## 2020-05-31 DIAGNOSIS — M779 Enthesopathy, unspecified: Secondary | ICD-10-CM

## 2020-05-31 DIAGNOSIS — M960 Pseudarthrosis after fusion or arthrodesis: Secondary | ICD-10-CM

## 2020-05-31 DIAGNOSIS — M19072 Primary osteoarthritis, left ankle and foot: Secondary | ICD-10-CM

## 2020-06-03 NOTE — Progress Notes (Addendum)
Subjective: Marilyn Perez is a 70 y.o. is seen today in office s/p left foot subtalar joint arthrodesis, ankle arthroscopy preformed on 02/29/2020.  She feels that overall she is doing better and is feeling better.  She is taking a few steps without the boot on going barefoot around the house.  She states that she feels better without the boot.  She has been taking vitamin D.  She is concerned that she may have a blood clot as the veins have been hurting to the point where she had taken Vicodin for it and she has noticed a prominent pain to the top of her foot and ankle.    Objective: General: No acute distress, AAOx3  DP/PT pulses palpable 2/4, CRT < 3 sec to all digits.  Protective sensation intact. Motor function intact.  Left foot: Incision is well coapted without any evidence of dehiscence and scars have formed.  There is no significant tenderness palpation of the surgical site.  There is minimal edema.  Is no erythema or warmth.  There are prominent veins present there is no pain with calf compression erythema or warmth of the calf is supple.  No pain with calf compression, swelling, warmth, erythema.   Assessment and Plan:  Status post left foot surgery; rule out DVT  -Treatment options discussed including all alternatives, risks, and complications -X-rays obtained and reviewed including multiple views.  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 Evidence of acute fracture and hardware is intact -Continue vitamin D.  We will work on trying that the bone stimulator ordered.  Discussed weightbearing as tolerated in cam boot that she is feeling better she can try to wear regular shoe since it seems to feel better without the boot. -Venous duplex ordered and will be done later today.  Continue aspirin daily.  Order to recheck Vit D in 2 weeks  Trula Slade DPM

## 2020-06-15 ENCOUNTER — Ambulatory Visit
Admission: RE | Admit: 2020-06-15 | Discharge: 2020-06-15 | Disposition: A | Discharge status: Home / Self Care | Payer: Medicare Other | Source: Ambulatory Visit | Attending: Internal Medicine | Admitting: Internal Medicine

## 2020-06-15 ENCOUNTER — Other Ambulatory Visit: Payer: Self-pay | Admitting: Internal Medicine

## 2020-06-15 DIAGNOSIS — R52 Pain, unspecified: Secondary | ICD-10-CM

## 2020-06-15 DIAGNOSIS — Z981 Arthrodesis status: Secondary | ICD-10-CM | POA: Diagnosis not present

## 2020-06-15 DIAGNOSIS — E559 Vitamin D deficiency, unspecified: Secondary | ICD-10-CM | POA: Diagnosis not present

## 2020-06-15 DIAGNOSIS — M79662 Pain in left lower leg: Secondary | ICD-10-CM | POA: Diagnosis not present

## 2020-06-15 IMAGING — CR DG TIBIA/FIBULA 2V*L*
4 series · 4 of 4 positions shown · non-contrast
Comparison: Ankle radiograph [DATE]

CLINICAL DATA: Pin in the left chin. Left lower leg pain for 4
months post ankle surgery.

EXAM:
LEFT TIBIA AND FIBULA - 2 VIEW

[t tib/fib ap left (1 of 2)]
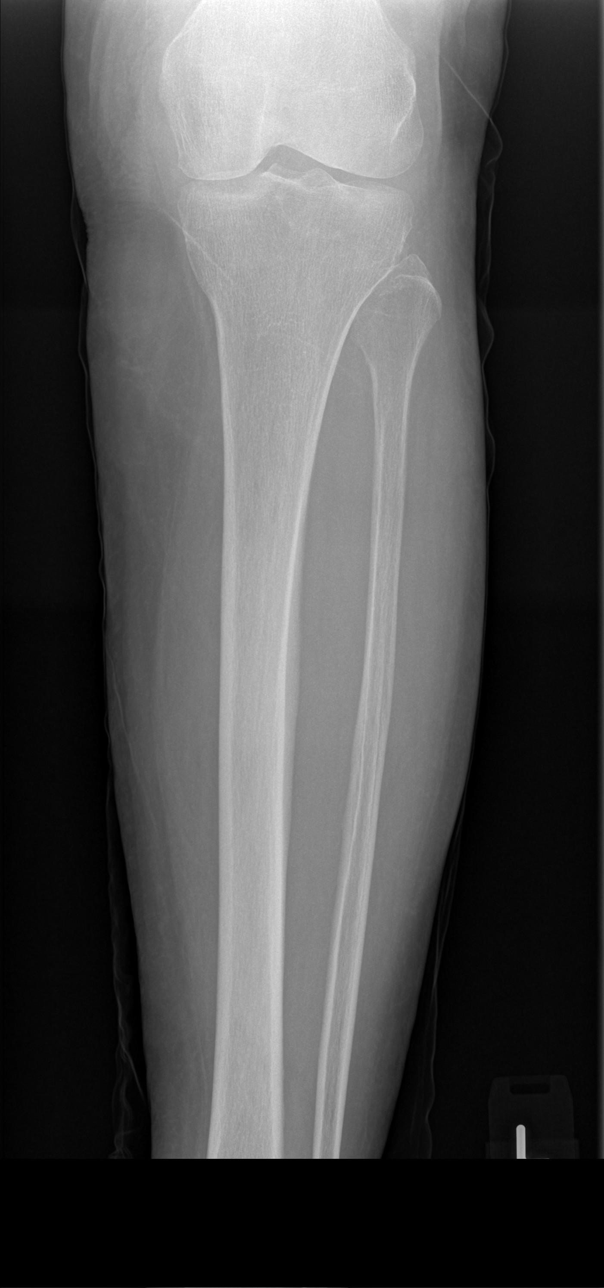

[t tib/fib ap left (2 of 2)]
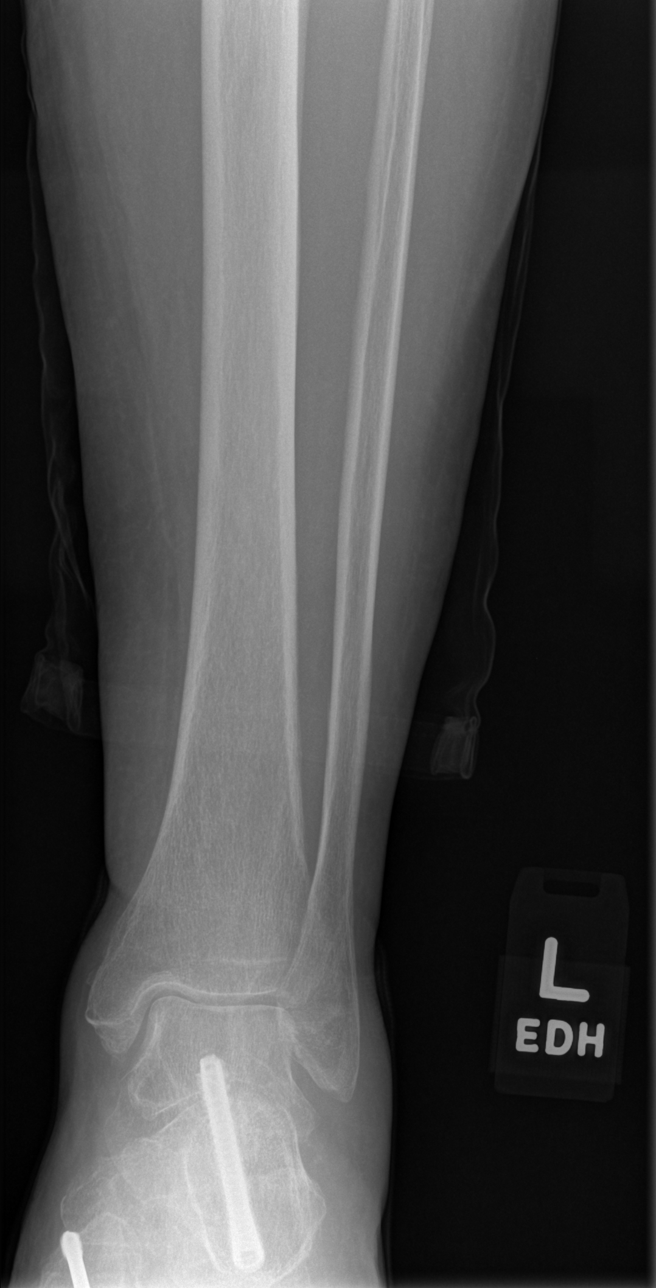

[t tib/fib lat left (1 of 2)]
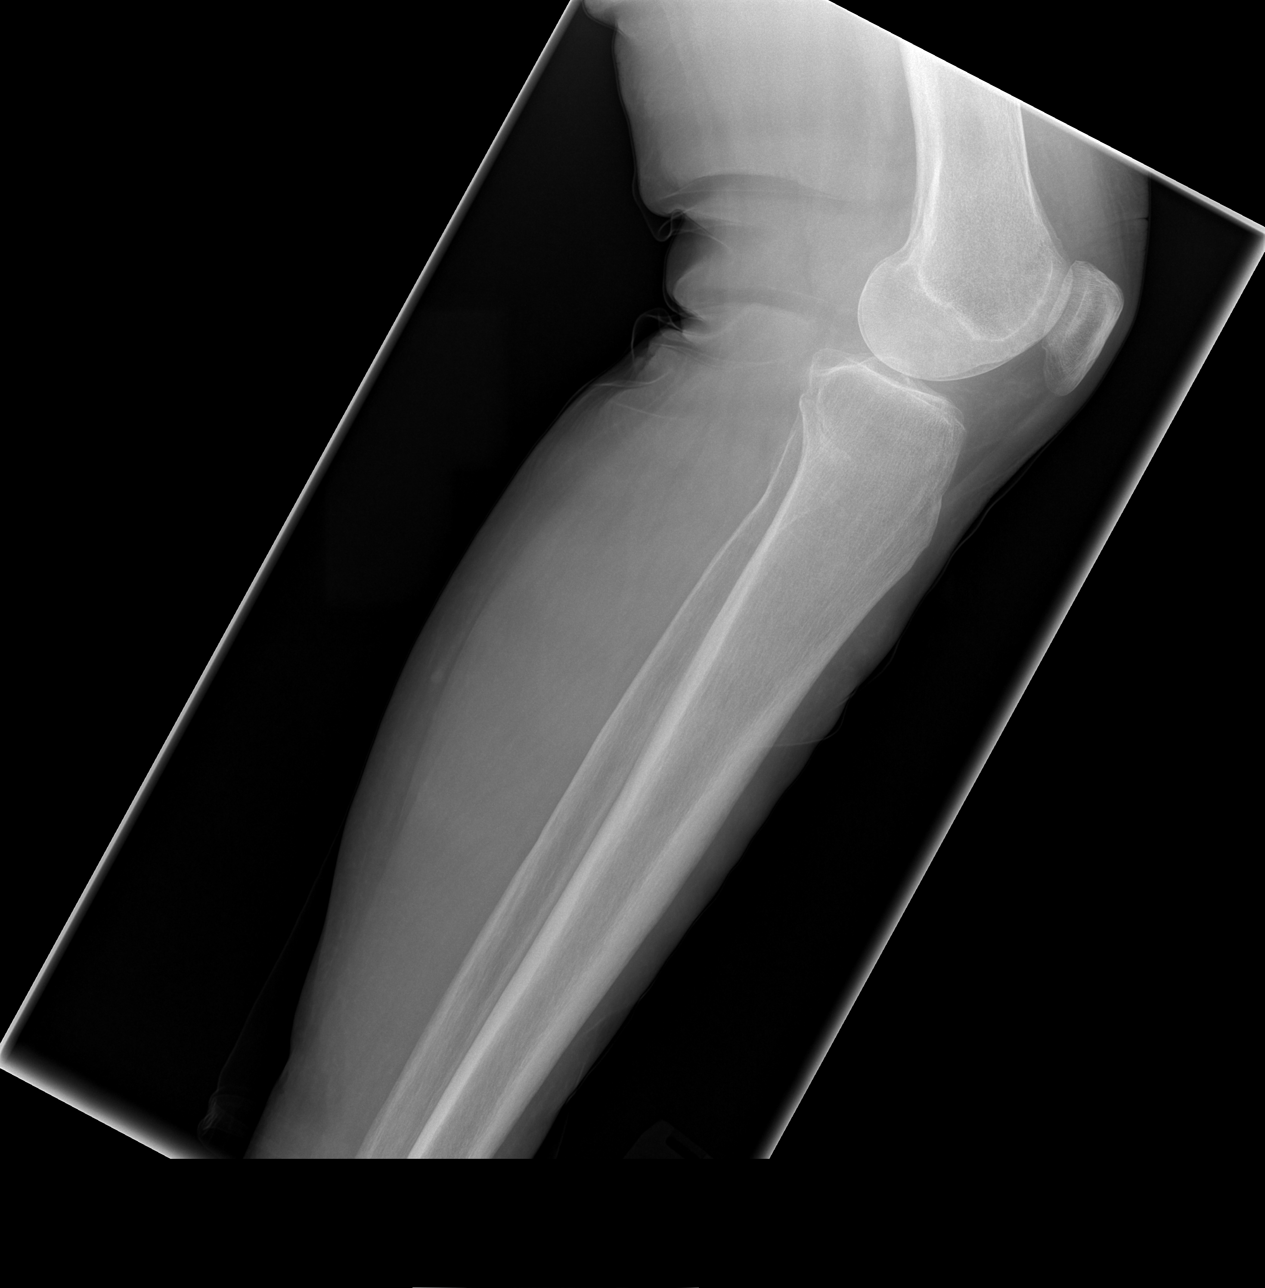

[t tib/fib lat left (2 of 2)]
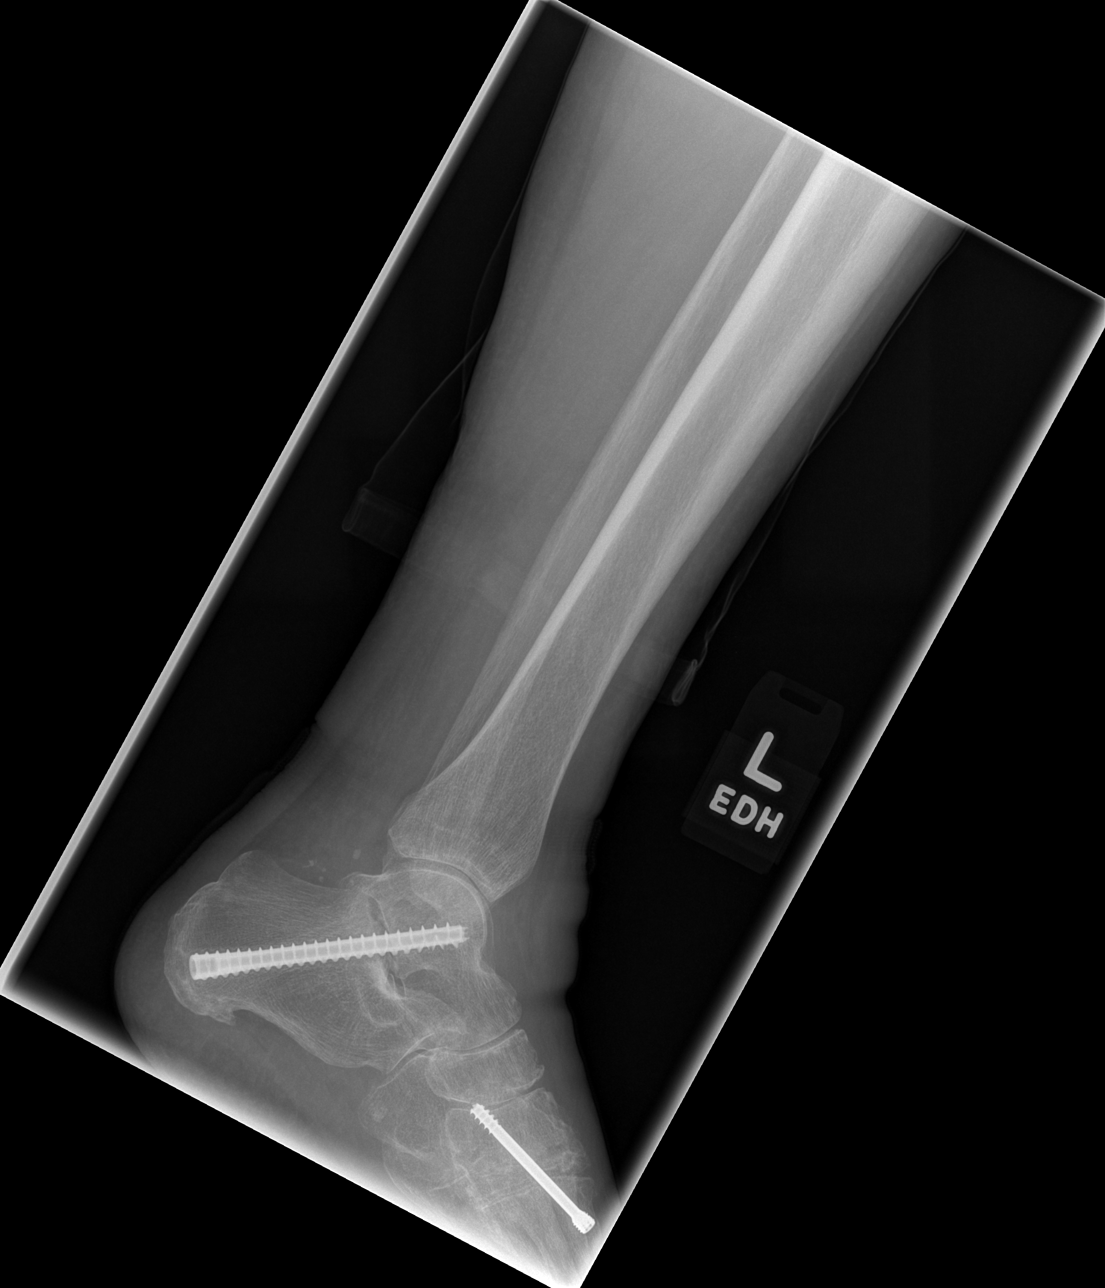

[4 of 4 positions shown; findings below may reference images not displayed]

FINDINGS: Cortical margins of the tibia and fibula are intact. There is no
evidence of fracture or other focal bone lesions. No periosteal
reaction or bony destruction. Knee and ankle alignment are
maintained. Subtalar fusion with unchanged hardware. Midfoot
hardware is partially included. Soft tissues are unremarkable.
IMPRESSION: 1. No acute osseous abnormality of the left lower leg or explanation
for pain.
2. Subtalar fusion.

## 2020-06-16 LAB — VITAMIN D 25 HYDROXY (VIT D DEFICIENCY, FRACTURES): Vit D, 25-Hydroxy: 35.3 ng/mL (ref 30.0–100.0)

## 2020-06-21 ENCOUNTER — Ambulatory Visit (INDEPENDENT_AMBULATORY_CARE_PROVIDER_SITE_OTHER): Payer: Medicare Other | Admitting: Podiatry

## 2020-06-21 ENCOUNTER — Other Ambulatory Visit: Payer: Self-pay

## 2020-06-21 ENCOUNTER — Ambulatory Visit (INDEPENDENT_AMBULATORY_CARE_PROVIDER_SITE_OTHER): Payer: Medicare Other

## 2020-06-21 DIAGNOSIS — M19072 Primary osteoarthritis, left ankle and foot: Secondary | ICD-10-CM | POA: Diagnosis not present

## 2020-06-21 DIAGNOSIS — E559 Vitamin D deficiency, unspecified: Secondary | ICD-10-CM

## 2020-06-21 DIAGNOSIS — M960 Pseudarthrosis after fusion or arthrodesis: Secondary | ICD-10-CM

## 2020-06-21 MED ORDER — VITAMIN D (ERGOCALCIFEROL) 1.25 MG (50000 UNIT) PO CAPS: 50000.0000 [IU] | ORAL_CAPSULE | ORAL | 0 refills | Status: DC

## 2020-06-26 DIAGNOSIS — Z79899 Other long term (current) drug therapy: Secondary | ICD-10-CM | POA: Diagnosis not present

## 2020-06-26 DIAGNOSIS — M0609 Rheumatoid arthritis without rheumatoid factor, multiple sites: Secondary | ICD-10-CM | POA: Diagnosis not present

## 2020-06-26 NOTE — Progress Notes (Signed)
Subjective: Marilyn Perez is a 70 y.o. is seen today in office s/p left foot subtalar joint arthrodesis, ankle arthroscopy preformed on 02/29/2020.  Use of crutches.  She does admit that she does go without the boot at home stopping walking.  She does feel that the pain is improving some.  Denies any recent injury or falls.  Decreased swelling but still get swelling mostly to the end of the day.  No recent injury or falls.  She is been on vitamin D as well.  She has no other concerns today.   Objective: General: No acute distress, AAOx3  DP/PT pulses palpable 2/4, CRT < 3 sec to all digits.  Protective sensation intact. Motor function intact.  Left foot: Incision is well coapted without any evidence of dehiscence and scars have formed.  There is no significant tenderness palpation of the surgical site.  There is minimal edema.  There is no erythema or warmth.  No pain with calf compression, swelling, warmth, erythema.   Assessment and Plan:  Status post left foot surgery; vitamin D deficiency  -Treatment options discussed including all alternatives, risks, and complications -X-rays obtained and reviewed including multiple views.  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 -At this time discussed weight-bear as tolerated in the cam boot. -We will continue vitamin D which I reordered today. -She just received specimen is only had 2 treatments.  Continue with this as well. -Ice/elevation  Return in about 4 weeks (around 07/19/2020).  Trula Slade DPM

## 2020-06-27 DIAGNOSIS — E663 Overweight: Secondary | ICD-10-CM | POA: Diagnosis not present

## 2020-06-27 DIAGNOSIS — M0609 Rheumatoid arthritis without rheumatoid factor, multiple sites: Secondary | ICD-10-CM | POA: Diagnosis not present

## 2020-06-27 DIAGNOSIS — Z6825 Body mass index (BMI) 25.0-25.9, adult: Secondary | ICD-10-CM | POA: Diagnosis not present

## 2020-06-27 DIAGNOSIS — M159 Polyosteoarthritis, unspecified: Secondary | ICD-10-CM | POA: Diagnosis not present

## 2020-06-27 DIAGNOSIS — M353 Polymyalgia rheumatica: Secondary | ICD-10-CM | POA: Diagnosis not present

## 2020-06-27 DIAGNOSIS — M2550 Pain in unspecified joint: Secondary | ICD-10-CM | POA: Diagnosis not present

## 2020-06-27 DIAGNOSIS — Z79899 Other long term (current) drug therapy: Secondary | ICD-10-CM | POA: Diagnosis not present

## 2020-07-19 ENCOUNTER — Other Ambulatory Visit: Payer: Self-pay

## 2020-07-19 ENCOUNTER — Telehealth: Payer: Self-pay

## 2020-07-19 ENCOUNTER — Ambulatory Visit (INDEPENDENT_AMBULATORY_CARE_PROVIDER_SITE_OTHER): Payer: Medicare Other

## 2020-07-19 ENCOUNTER — Ambulatory Visit (INDEPENDENT_AMBULATORY_CARE_PROVIDER_SITE_OTHER): Payer: Medicare Other | Admitting: Podiatry

## 2020-07-19 DIAGNOSIS — M960 Pseudarthrosis after fusion or arthrodesis: Secondary | ICD-10-CM | POA: Diagnosis not present

## 2020-07-19 DIAGNOSIS — M792 Neuralgia and neuritis, unspecified: Secondary | ICD-10-CM | POA: Diagnosis not present

## 2020-07-19 DIAGNOSIS — M19072 Primary osteoarthritis, left ankle and foot: Secondary | ICD-10-CM | POA: Diagnosis not present

## 2020-07-19 MED ORDER — GABAPENTIN 100 MG PO CAPS: 100.0000 mg | ORAL_CAPSULE | Freq: Every day | ORAL | 0 refills | Status: DC

## 2020-07-19 NOTE — Telephone Encounter (Signed)
PT order delivered to [facility]

## 2020-07-22 NOTE — Progress Notes (Signed)
Subjective: Marilyn Perez is a 70 y.o. is seen today in office s/p left foot subtalar joint arthrodesis, ankle arthroscopy preformed on 02/29/2020.  She presents today wearing a regular shoe however using a cane for stability.  She is not having significant discomfort to her surgical site mostly to the from the leg.  She also gets nerve symptoms.  No recent injury or falls.  She still using the bone stimulator and states that she has not going to go in the cam boot.  Objective: General: No acute distress, AAOx3  DP/PT pulses palpable 2/4, CRT < 3 sec to all digits.  Protective sensation intact. Motor function intact.  Left foot: Incision is well coapted without any evidence of dehiscence and scars have formed.  There is no significant tenderness palpation of the surgical site.  There is trace edema.  There is no erythema or warmth.  Discomfort more to the anterior leg on the extensor musculature.  There is no pain with calf compression, erythema or warmth in the calf is supple.  Assessment and Plan:  Status post left foot surgery; vitamin D deficiency  -Treatment options discussed including all alternatives, risks, and complications -X-rays obtained and reviewed including multiple views.  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 -Continue bone stimulator -Physical therapy -We will start gabapentin.  Discussed side effects of medication.   Trula Slade DPM

## 2020-07-23 DIAGNOSIS — M25571 Pain in right ankle and joints of right foot: Secondary | ICD-10-CM | POA: Diagnosis not present

## 2020-07-23 DIAGNOSIS — R2689 Other abnormalities of gait and mobility: Secondary | ICD-10-CM | POA: Diagnosis not present

## 2020-07-23 DIAGNOSIS — R531 Weakness: Secondary | ICD-10-CM | POA: Diagnosis not present

## 2020-07-23 DIAGNOSIS — M25572 Pain in left ankle and joints of left foot: Secondary | ICD-10-CM | POA: Diagnosis not present

## 2020-07-23 DIAGNOSIS — M25672 Stiffness of left ankle, not elsewhere classified: Secondary | ICD-10-CM | POA: Diagnosis not present

## 2020-07-23 DIAGNOSIS — M25671 Stiffness of right ankle, not elsewhere classified: Secondary | ICD-10-CM | POA: Diagnosis not present

## 2020-07-24 DIAGNOSIS — M0609 Rheumatoid arthritis without rheumatoid factor, multiple sites: Secondary | ICD-10-CM | POA: Diagnosis not present

## 2020-07-25 DIAGNOSIS — M25571 Pain in right ankle and joints of right foot: Secondary | ICD-10-CM | POA: Diagnosis not present

## 2020-07-25 DIAGNOSIS — R531 Weakness: Secondary | ICD-10-CM | POA: Diagnosis not present

## 2020-07-25 DIAGNOSIS — M25671 Stiffness of right ankle, not elsewhere classified: Secondary | ICD-10-CM | POA: Diagnosis not present

## 2020-07-25 DIAGNOSIS — M25672 Stiffness of left ankle, not elsewhere classified: Secondary | ICD-10-CM | POA: Diagnosis not present

## 2020-07-25 DIAGNOSIS — R2689 Other abnormalities of gait and mobility: Secondary | ICD-10-CM | POA: Diagnosis not present

## 2020-07-25 DIAGNOSIS — M25572 Pain in left ankle and joints of left foot: Secondary | ICD-10-CM | POA: Diagnosis not present

## 2020-07-27 DIAGNOSIS — M25571 Pain in right ankle and joints of right foot: Secondary | ICD-10-CM | POA: Diagnosis not present

## 2020-07-27 DIAGNOSIS — M25671 Stiffness of right ankle, not elsewhere classified: Secondary | ICD-10-CM | POA: Diagnosis not present

## 2020-07-27 DIAGNOSIS — R531 Weakness: Secondary | ICD-10-CM | POA: Diagnosis not present

## 2020-07-27 DIAGNOSIS — M25672 Stiffness of left ankle, not elsewhere classified: Secondary | ICD-10-CM | POA: Diagnosis not present

## 2020-07-27 DIAGNOSIS — M25572 Pain in left ankle and joints of left foot: Secondary | ICD-10-CM | POA: Diagnosis not present

## 2020-07-27 DIAGNOSIS — R2689 Other abnormalities of gait and mobility: Secondary | ICD-10-CM | POA: Diagnosis not present

## 2020-07-30 DIAGNOSIS — M25672 Stiffness of left ankle, not elsewhere classified: Secondary | ICD-10-CM | POA: Diagnosis not present

## 2020-07-30 DIAGNOSIS — M25671 Stiffness of right ankle, not elsewhere classified: Secondary | ICD-10-CM | POA: Diagnosis not present

## 2020-07-30 DIAGNOSIS — M25572 Pain in left ankle and joints of left foot: Secondary | ICD-10-CM | POA: Diagnosis not present

## 2020-07-30 DIAGNOSIS — R2689 Other abnormalities of gait and mobility: Secondary | ICD-10-CM | POA: Diagnosis not present

## 2020-07-30 DIAGNOSIS — M25571 Pain in right ankle and joints of right foot: Secondary | ICD-10-CM | POA: Diagnosis not present

## 2020-07-30 DIAGNOSIS — R531 Weakness: Secondary | ICD-10-CM | POA: Diagnosis not present

## 2020-08-01 DIAGNOSIS — M25571 Pain in right ankle and joints of right foot: Secondary | ICD-10-CM | POA: Diagnosis not present

## 2020-08-01 DIAGNOSIS — R531 Weakness: Secondary | ICD-10-CM | POA: Diagnosis not present

## 2020-08-01 DIAGNOSIS — M25672 Stiffness of left ankle, not elsewhere classified: Secondary | ICD-10-CM | POA: Diagnosis not present

## 2020-08-01 DIAGNOSIS — M25572 Pain in left ankle and joints of left foot: Secondary | ICD-10-CM | POA: Diagnosis not present

## 2020-08-01 DIAGNOSIS — R2689 Other abnormalities of gait and mobility: Secondary | ICD-10-CM | POA: Diagnosis not present

## 2020-08-01 DIAGNOSIS — M25671 Stiffness of right ankle, not elsewhere classified: Secondary | ICD-10-CM | POA: Diagnosis not present

## 2020-08-03 DIAGNOSIS — M25671 Stiffness of right ankle, not elsewhere classified: Secondary | ICD-10-CM | POA: Diagnosis not present

## 2020-08-03 DIAGNOSIS — M25571 Pain in right ankle and joints of right foot: Secondary | ICD-10-CM | POA: Diagnosis not present

## 2020-08-03 DIAGNOSIS — M25572 Pain in left ankle and joints of left foot: Secondary | ICD-10-CM | POA: Diagnosis not present

## 2020-08-03 DIAGNOSIS — R531 Weakness: Secondary | ICD-10-CM | POA: Diagnosis not present

## 2020-08-03 DIAGNOSIS — R2689 Other abnormalities of gait and mobility: Secondary | ICD-10-CM | POA: Diagnosis not present

## 2020-08-03 DIAGNOSIS — M25672 Stiffness of left ankle, not elsewhere classified: Secondary | ICD-10-CM | POA: Diagnosis not present

## 2020-08-06 DIAGNOSIS — M25672 Stiffness of left ankle, not elsewhere classified: Secondary | ICD-10-CM | POA: Diagnosis not present

## 2020-08-06 DIAGNOSIS — M25671 Stiffness of right ankle, not elsewhere classified: Secondary | ICD-10-CM | POA: Diagnosis not present

## 2020-08-06 DIAGNOSIS — R531 Weakness: Secondary | ICD-10-CM | POA: Diagnosis not present

## 2020-08-06 DIAGNOSIS — R2689 Other abnormalities of gait and mobility: Secondary | ICD-10-CM | POA: Diagnosis not present

## 2020-08-06 DIAGNOSIS — M25571 Pain in right ankle and joints of right foot: Secondary | ICD-10-CM | POA: Diagnosis not present

## 2020-08-06 DIAGNOSIS — M25572 Pain in left ankle and joints of left foot: Secondary | ICD-10-CM | POA: Diagnosis not present

## 2020-08-08 DIAGNOSIS — R2689 Other abnormalities of gait and mobility: Secondary | ICD-10-CM | POA: Diagnosis not present

## 2020-08-08 DIAGNOSIS — M25572 Pain in left ankle and joints of left foot: Secondary | ICD-10-CM | POA: Diagnosis not present

## 2020-08-08 DIAGNOSIS — M25571 Pain in right ankle and joints of right foot: Secondary | ICD-10-CM | POA: Diagnosis not present

## 2020-08-08 DIAGNOSIS — R531 Weakness: Secondary | ICD-10-CM | POA: Diagnosis not present

## 2020-08-08 DIAGNOSIS — M25671 Stiffness of right ankle, not elsewhere classified: Secondary | ICD-10-CM | POA: Diagnosis not present

## 2020-08-08 DIAGNOSIS — M25672 Stiffness of left ankle, not elsewhere classified: Secondary | ICD-10-CM | POA: Diagnosis not present

## 2020-08-13 DIAGNOSIS — R531 Weakness: Secondary | ICD-10-CM | POA: Diagnosis not present

## 2020-08-13 DIAGNOSIS — M25671 Stiffness of right ankle, not elsewhere classified: Secondary | ICD-10-CM | POA: Diagnosis not present

## 2020-08-13 DIAGNOSIS — M25571 Pain in right ankle and joints of right foot: Secondary | ICD-10-CM | POA: Diagnosis not present

## 2020-08-13 DIAGNOSIS — M25672 Stiffness of left ankle, not elsewhere classified: Secondary | ICD-10-CM | POA: Diagnosis not present

## 2020-08-13 DIAGNOSIS — R2689 Other abnormalities of gait and mobility: Secondary | ICD-10-CM | POA: Diagnosis not present

## 2020-08-13 DIAGNOSIS — M25572 Pain in left ankle and joints of left foot: Secondary | ICD-10-CM | POA: Diagnosis not present

## 2020-08-15 DIAGNOSIS — R531 Weakness: Secondary | ICD-10-CM | POA: Diagnosis not present

## 2020-08-15 DIAGNOSIS — M25671 Stiffness of right ankle, not elsewhere classified: Secondary | ICD-10-CM | POA: Diagnosis not present

## 2020-08-15 DIAGNOSIS — M25672 Stiffness of left ankle, not elsewhere classified: Secondary | ICD-10-CM | POA: Diagnosis not present

## 2020-08-15 DIAGNOSIS — M25571 Pain in right ankle and joints of right foot: Secondary | ICD-10-CM | POA: Diagnosis not present

## 2020-08-15 DIAGNOSIS — M25572 Pain in left ankle and joints of left foot: Secondary | ICD-10-CM | POA: Diagnosis not present

## 2020-08-15 DIAGNOSIS — R2689 Other abnormalities of gait and mobility: Secondary | ICD-10-CM | POA: Diagnosis not present

## 2020-08-16 ENCOUNTER — Other Ambulatory Visit: Payer: Self-pay | Admitting: Podiatry

## 2020-08-17 DIAGNOSIS — R2689 Other abnormalities of gait and mobility: Secondary | ICD-10-CM | POA: Diagnosis not present

## 2020-08-17 DIAGNOSIS — M25671 Stiffness of right ankle, not elsewhere classified: Secondary | ICD-10-CM | POA: Diagnosis not present

## 2020-08-17 DIAGNOSIS — M25571 Pain in right ankle and joints of right foot: Secondary | ICD-10-CM | POA: Diagnosis not present

## 2020-08-17 DIAGNOSIS — M25572 Pain in left ankle and joints of left foot: Secondary | ICD-10-CM | POA: Diagnosis not present

## 2020-08-17 DIAGNOSIS — R531 Weakness: Secondary | ICD-10-CM | POA: Diagnosis not present

## 2020-08-17 DIAGNOSIS — M25672 Stiffness of left ankle, not elsewhere classified: Secondary | ICD-10-CM | POA: Diagnosis not present

## 2020-08-21 DIAGNOSIS — M0609 Rheumatoid arthritis without rheumatoid factor, multiple sites: Secondary | ICD-10-CM | POA: Diagnosis not present

## 2020-08-22 DIAGNOSIS — M25572 Pain in left ankle and joints of left foot: Secondary | ICD-10-CM | POA: Diagnosis not present

## 2020-08-22 DIAGNOSIS — M25672 Stiffness of left ankle, not elsewhere classified: Secondary | ICD-10-CM | POA: Diagnosis not present

## 2020-08-22 DIAGNOSIS — M25671 Stiffness of right ankle, not elsewhere classified: Secondary | ICD-10-CM | POA: Diagnosis not present

## 2020-08-22 DIAGNOSIS — R531 Weakness: Secondary | ICD-10-CM | POA: Diagnosis not present

## 2020-08-22 DIAGNOSIS — R2689 Other abnormalities of gait and mobility: Secondary | ICD-10-CM | POA: Diagnosis not present

## 2020-08-22 DIAGNOSIS — M25571 Pain in right ankle and joints of right foot: Secondary | ICD-10-CM | POA: Diagnosis not present

## 2020-08-23 DIAGNOSIS — R202 Paresthesia of skin: Secondary | ICD-10-CM | POA: Diagnosis not present

## 2020-08-23 DIAGNOSIS — M545 Low back pain, unspecified: Secondary | ICD-10-CM | POA: Diagnosis not present

## 2020-08-23 DIAGNOSIS — G8929 Other chronic pain: Secondary | ICD-10-CM | POA: Diagnosis not present

## 2020-08-23 DIAGNOSIS — R030 Elevated blood-pressure reading, without diagnosis of hypertension: Secondary | ICD-10-CM | POA: Diagnosis not present

## 2020-08-23 DIAGNOSIS — Z6825 Body mass index (BMI) 25.0-25.9, adult: Secondary | ICD-10-CM | POA: Diagnosis not present

## 2020-08-24 DIAGNOSIS — R2689 Other abnormalities of gait and mobility: Secondary | ICD-10-CM | POA: Diagnosis not present

## 2020-08-24 DIAGNOSIS — M25572 Pain in left ankle and joints of left foot: Secondary | ICD-10-CM | POA: Diagnosis not present

## 2020-08-24 DIAGNOSIS — M25671 Stiffness of right ankle, not elsewhere classified: Secondary | ICD-10-CM | POA: Diagnosis not present

## 2020-08-24 DIAGNOSIS — M25571 Pain in right ankle and joints of right foot: Secondary | ICD-10-CM | POA: Diagnosis not present

## 2020-08-24 DIAGNOSIS — M25672 Stiffness of left ankle, not elsewhere classified: Secondary | ICD-10-CM | POA: Diagnosis not present

## 2020-08-24 DIAGNOSIS — R531 Weakness: Secondary | ICD-10-CM | POA: Diagnosis not present

## 2020-08-27 ENCOUNTER — Other Ambulatory Visit: Payer: Self-pay | Admitting: Physician Assistant

## 2020-08-27 DIAGNOSIS — M069 Rheumatoid arthritis, unspecified: Secondary | ICD-10-CM | POA: Diagnosis not present

## 2020-08-27 DIAGNOSIS — M545 Low back pain, unspecified: Secondary | ICD-10-CM

## 2020-08-27 DIAGNOSIS — Z23 Encounter for immunization: Secondary | ICD-10-CM | POA: Diagnosis not present

## 2020-08-27 DIAGNOSIS — Z0000 Encounter for general adult medical examination without abnormal findings: Secondary | ICD-10-CM | POA: Diagnosis not present

## 2020-08-27 DIAGNOSIS — E538 Deficiency of other specified B group vitamins: Secondary | ICD-10-CM | POA: Diagnosis not present

## 2020-08-27 DIAGNOSIS — G8929 Other chronic pain: Secondary | ICD-10-CM

## 2020-08-27 DIAGNOSIS — E7800 Pure hypercholesterolemia, unspecified: Secondary | ICD-10-CM | POA: Diagnosis not present

## 2020-08-27 DIAGNOSIS — M8580 Other specified disorders of bone density and structure, unspecified site: Secondary | ICD-10-CM | POA: Diagnosis not present

## 2020-08-30 ENCOUNTER — Ambulatory Visit (INDEPENDENT_AMBULATORY_CARE_PROVIDER_SITE_OTHER): Payer: Medicare Other | Admitting: Podiatry

## 2020-08-30 ENCOUNTER — Other Ambulatory Visit: Payer: Self-pay

## 2020-08-30 DIAGNOSIS — R531 Weakness: Secondary | ICD-10-CM | POA: Diagnosis not present

## 2020-08-30 DIAGNOSIS — R2689 Other abnormalities of gait and mobility: Secondary | ICD-10-CM | POA: Diagnosis not present

## 2020-08-30 DIAGNOSIS — M792 Neuralgia and neuritis, unspecified: Secondary | ICD-10-CM

## 2020-08-30 DIAGNOSIS — M19072 Primary osteoarthritis, left ankle and foot: Secondary | ICD-10-CM | POA: Diagnosis not present

## 2020-08-30 DIAGNOSIS — M25572 Pain in left ankle and joints of left foot: Secondary | ICD-10-CM | POA: Diagnosis not present

## 2020-08-30 DIAGNOSIS — M25671 Stiffness of right ankle, not elsewhere classified: Secondary | ICD-10-CM | POA: Diagnosis not present

## 2020-08-30 DIAGNOSIS — M960 Pseudarthrosis after fusion or arthrodesis: Secondary | ICD-10-CM

## 2020-08-30 DIAGNOSIS — M25571 Pain in right ankle and joints of right foot: Secondary | ICD-10-CM | POA: Diagnosis not present

## 2020-08-30 DIAGNOSIS — M25672 Stiffness of left ankle, not elsewhere classified: Secondary | ICD-10-CM | POA: Diagnosis not present

## 2020-08-31 DIAGNOSIS — M25571 Pain in right ankle and joints of right foot: Secondary | ICD-10-CM | POA: Diagnosis not present

## 2020-08-31 DIAGNOSIS — M25572 Pain in left ankle and joints of left foot: Secondary | ICD-10-CM | POA: Diagnosis not present

## 2020-08-31 DIAGNOSIS — M25671 Stiffness of right ankle, not elsewhere classified: Secondary | ICD-10-CM | POA: Diagnosis not present

## 2020-08-31 DIAGNOSIS — R2689 Other abnormalities of gait and mobility: Secondary | ICD-10-CM | POA: Diagnosis not present

## 2020-08-31 DIAGNOSIS — R531 Weakness: Secondary | ICD-10-CM | POA: Diagnosis not present

## 2020-08-31 DIAGNOSIS — M25672 Stiffness of left ankle, not elsewhere classified: Secondary | ICD-10-CM | POA: Diagnosis not present

## 2020-09-01 NOTE — Progress Notes (Signed)
Subjective: Marilyn Perez is a 70 y.o. is seen today in office s/p left foot subtalar joint arthrodesis, ankle arthroscopy preformed on 02/29/2020.  Overall she states my surgical standpoint she has been improving.  She is back to wearing regular shoe she been more active and doing more normal activities.  She is still doing some physical therapy to help her gait and walking.  She has stopped using the bone stimulator.  She has been having some burning sensations in the front of her leg.  She states that this is been ongoing since prior to surgery but since the surgery symptoms seem to be worsening.  She has been followed by neurology for this.  Objective: General: No acute distress, AAOx3  DP/PT pulses palpable 2/4, CRT < 3 sec to all digits.  Protective sensation intact. Motor function intact.  Left foot: Incision is well coapted without any evidence of dehiscence and scars have formed.  There is no significant discomfort of the surgical site on the subtalar joint.  There is no pain with ankle joint range of motion.  No edema, erythema.  Not able to elicit any areas of discomfort today.  She is presents today with obtaining a regular shoe.  MMT 5/5.  Assessment and Plan:  Status post left foot surgery  -Treatment options discussed including all alternatives, risks, and complications -From a surgical perspective she is continue to improve.  Her main concern now is walking normally and she still in physical therapy to help with this but from a pain standpoint she has made good improvements.  She does have some nerve issues which were started prior to surgery she believes but since surgery has worsened.  This seems to be on the anterior aspect of the left leg.  She continue follow-up with neurology for this.  Return in about 2 months (around 10/30/2020).  Trula Slade DPM

## 2020-09-03 DIAGNOSIS — R531 Weakness: Secondary | ICD-10-CM | POA: Diagnosis not present

## 2020-09-03 DIAGNOSIS — R2689 Other abnormalities of gait and mobility: Secondary | ICD-10-CM | POA: Diagnosis not present

## 2020-09-03 DIAGNOSIS — M25671 Stiffness of right ankle, not elsewhere classified: Secondary | ICD-10-CM | POA: Diagnosis not present

## 2020-09-03 DIAGNOSIS — M25672 Stiffness of left ankle, not elsewhere classified: Secondary | ICD-10-CM | POA: Diagnosis not present

## 2020-09-03 DIAGNOSIS — M25572 Pain in left ankle and joints of left foot: Secondary | ICD-10-CM | POA: Diagnosis not present

## 2020-09-03 DIAGNOSIS — M25571 Pain in right ankle and joints of right foot: Secondary | ICD-10-CM | POA: Diagnosis not present

## 2020-09-04 ENCOUNTER — Ambulatory Visit
Admission: RE | Admit: 2020-09-04 | Discharge: 2020-09-04 | Disposition: A | Discharge status: Home / Self Care | Payer: Medicare Other | Source: Ambulatory Visit | Attending: Physician Assistant | Admitting: Physician Assistant

## 2020-09-04 ENCOUNTER — Other Ambulatory Visit: Payer: Self-pay

## 2020-09-04 DIAGNOSIS — G8929 Other chronic pain: Secondary | ICD-10-CM

## 2020-09-04 DIAGNOSIS — M545 Low back pain, unspecified: Secondary | ICD-10-CM | POA: Diagnosis not present

## 2020-09-04 DIAGNOSIS — M48061 Spinal stenosis, lumbar region without neurogenic claudication: Secondary | ICD-10-CM | POA: Diagnosis not present

## 2020-09-04 IMAGING — MR MR LUMBAR SPINE W/O CM
4 of 5 series · 28 of 48 positions shown · non-contrast
Comparison: [E0] outside study with images only

CLINICAL DATA: Low back pain radiating into left leg

EXAM:
MRI LUMBAR SPINE WITHOUT CONTRAST
TECHNIQUE: Multiplanar, multisequence MR imaging of the lumbar spine was
performed. No intravenous contrast was administered.

[Series 2: T2 · sagittal · 4.0mm · 1.09mm/px · 6 of 17 slices shown (1 of 2)]
[im 1/17]
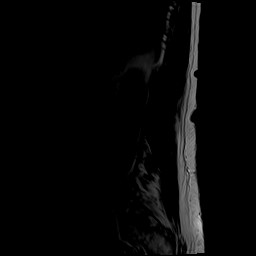
[im 4/17]
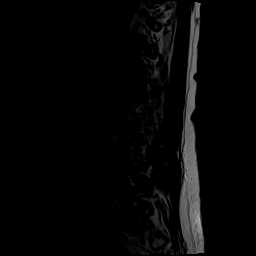
[im 7/17]
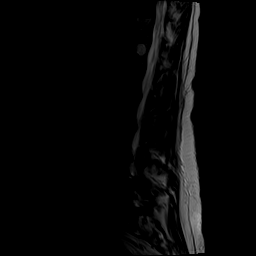
[im 10/17]
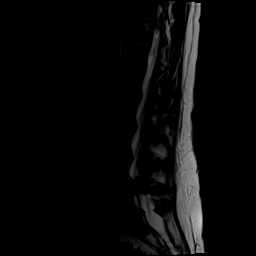
[im 13/17]
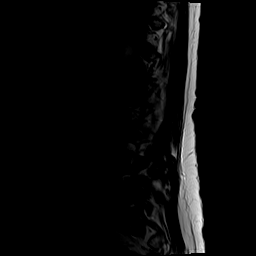
[im 17/17]
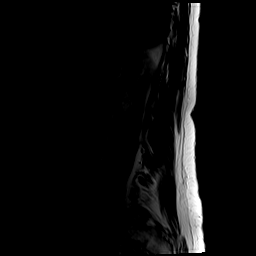

[Series 4: T1 · sagittal · 4.0mm · 1.09mm/px · 7 of 17 slices shown (1 of 2)]
[im 1/17]
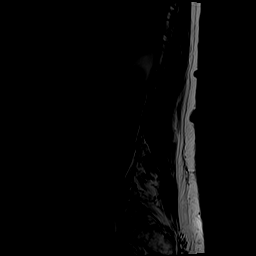
[im 3/17]
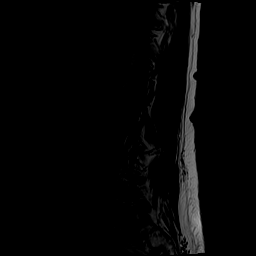
[im 6/17]
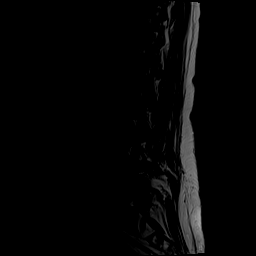
[im 9/17]
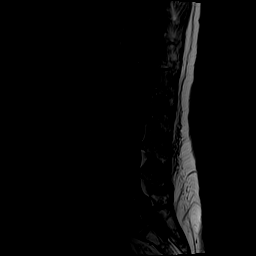
[im 11/17]
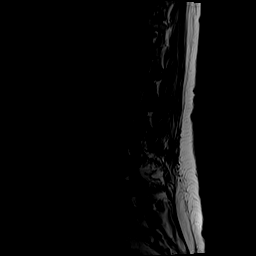
[im 14/17]
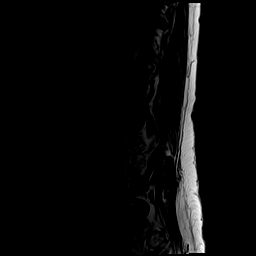
[im 17/17]
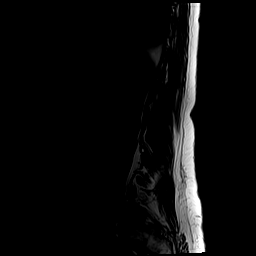

[Series 5: T2 · axial · 4.0mm · 0.39mm/px · z∈[-119,+97]mm · 8 of 36 slices shown (2 of 2)]
[im 1/36]
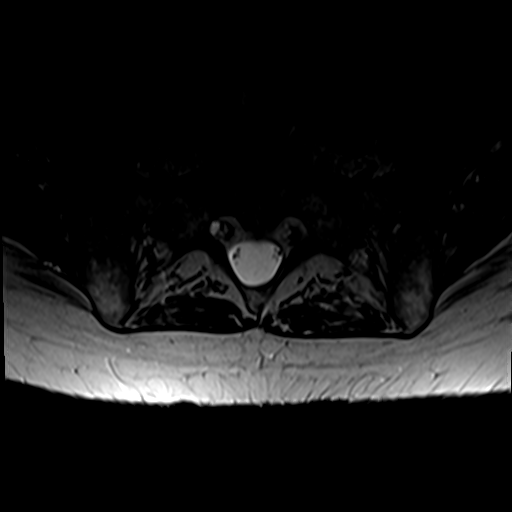
[im 6/36]
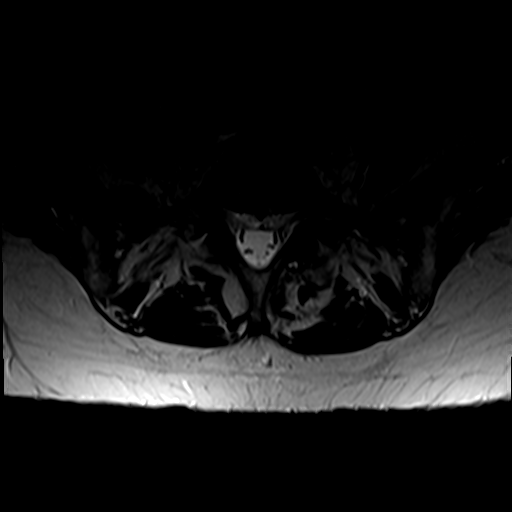
[im 11/36]
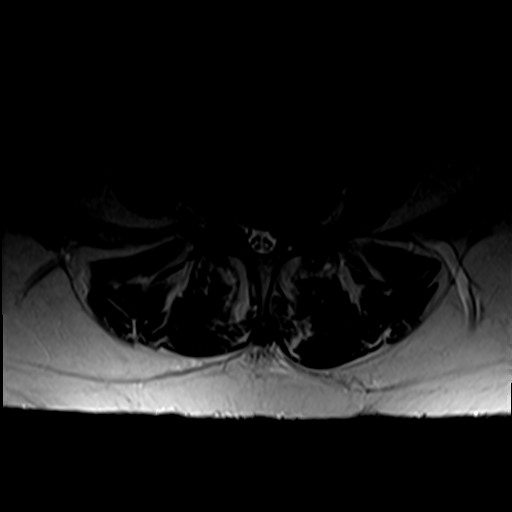
[im 17/36]
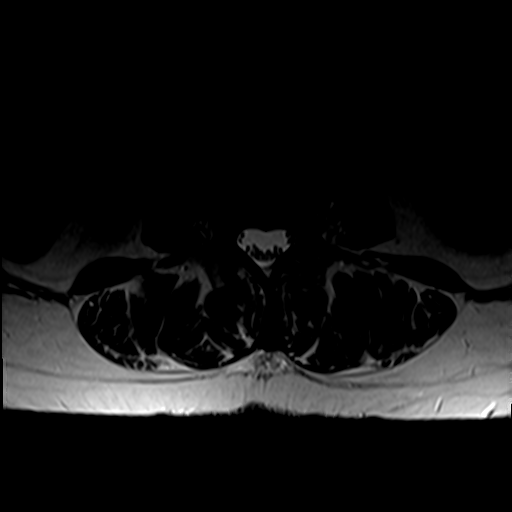
[im 19/36]
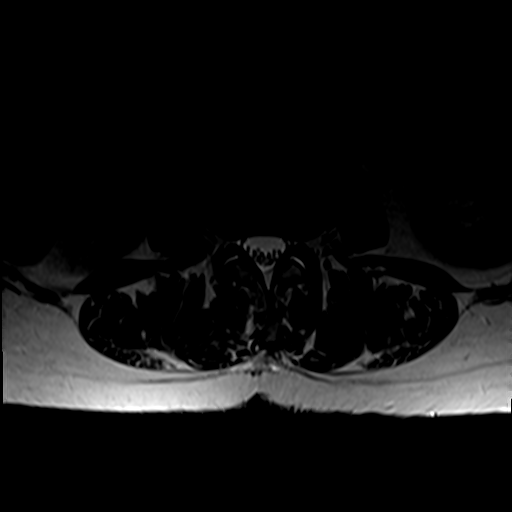
[im 25/36]
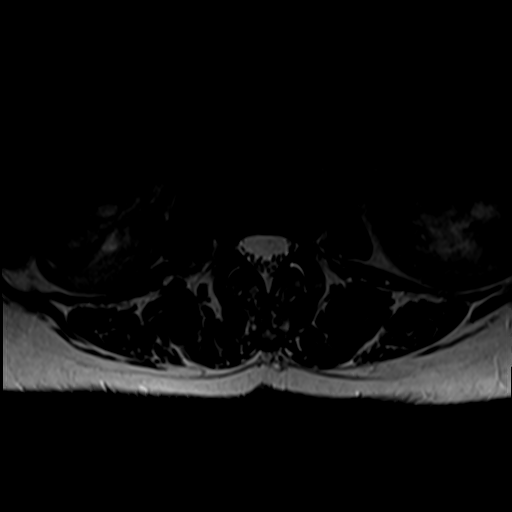
[im 30/36]
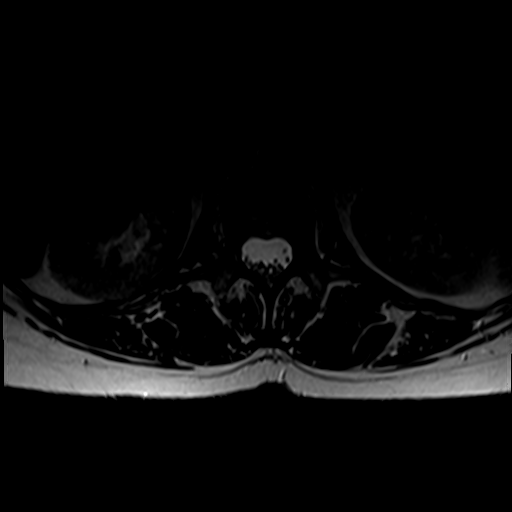
[im 36/36]
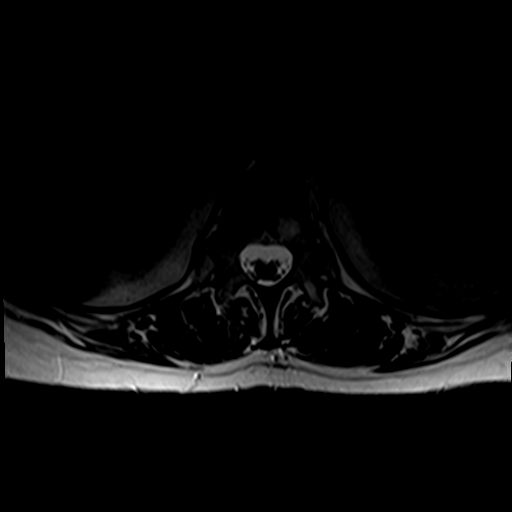

[Series 6: T1 · axial · 4.0mm · 0.39mm/px · z∈[-119,+68]mm · 7 of 36 slices shown (2 of 2)]
[im 1/36]
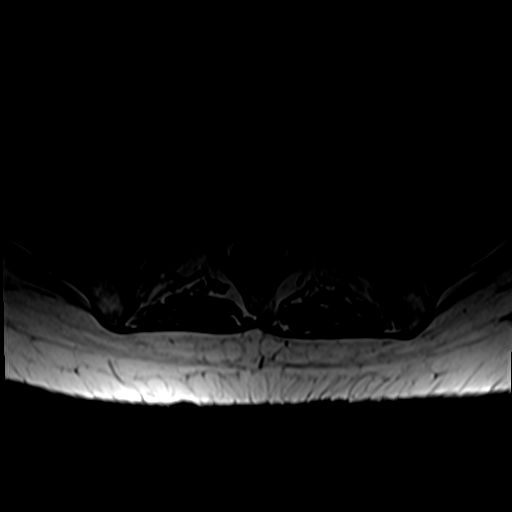
[im 6/36]
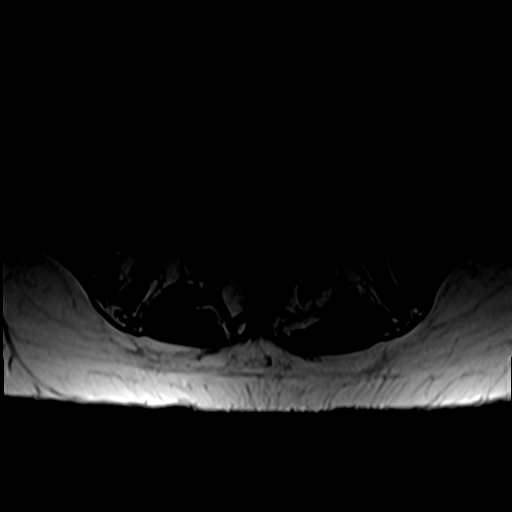
[im 11/36]
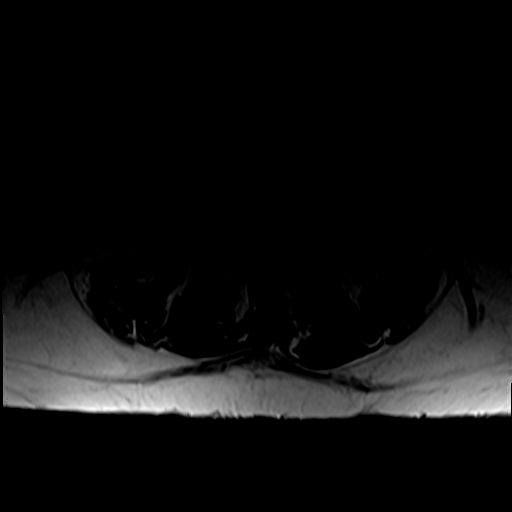
[im 17/36]
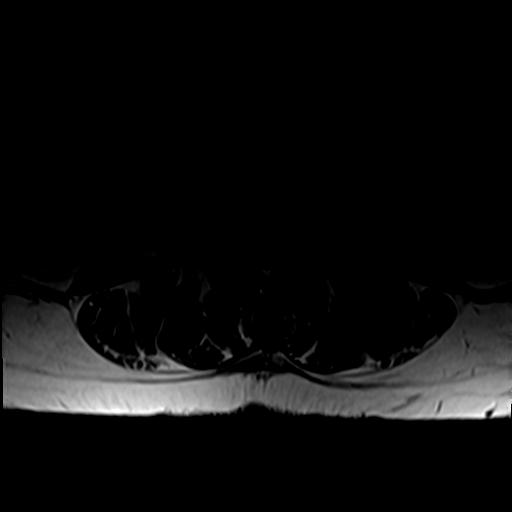
[im 19/36]
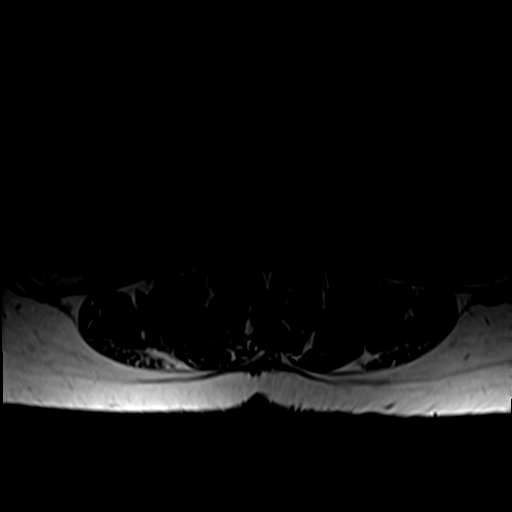
[im 25/36]
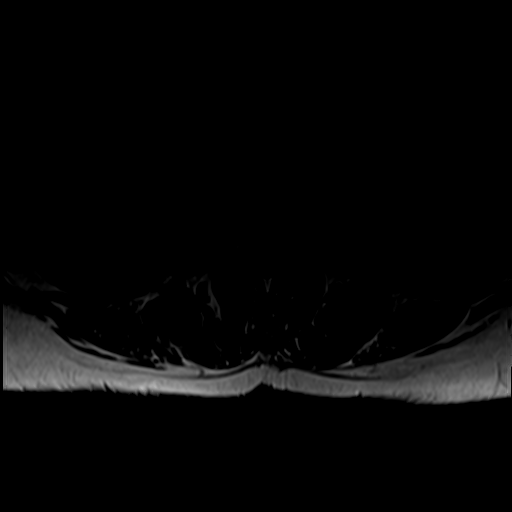
[im 30/36]
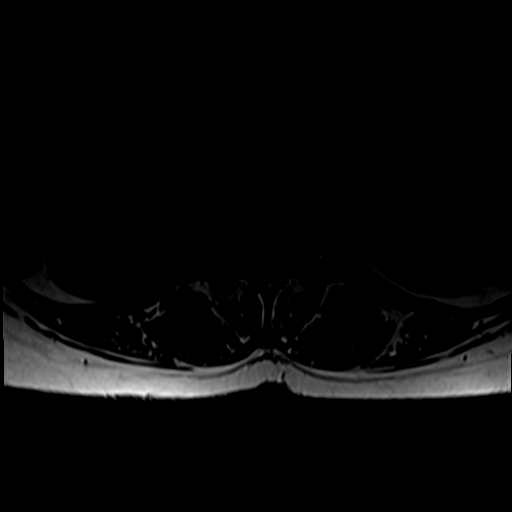

[28 of 48 positions shown; findings below may reference images not displayed]

FINDINGS: Segmentation:  Standard.

Alignment:  New trace anterolisthesis at L4-L5.

Vertebrae: Vertebral body heights are maintained. There is no marrow
edema. No suspicious osseous lesion. Vertebral body hemangioma at
T12.

Conus medullaris and cauda equina: Conus extends to the L1 level.
Conus and cauda equina appear normal.

Paraspinal and other soft tissues: Unremarkable

Disc levels: Disc space narrowing is greatest at L5-S1.

L1-L2:  Trace disc bulge.  No canal or foraminal stenosis.

L2-L3:  Trace disc bulge.  No canal or foraminal stenosis.

L3-L4:  Trace disc bulge.  No canal or foraminal stenosis.

L4-L5: Anterolisthesis with uncovering of disc bulge. Facet
arthropathy with ligamentum flavum infolding and joint effusions.
Moderate canal stenosis. Effacement of the subarticular recesses.
Mild to moderate, left greater than right foraminal stenosis.

L5-S1: Disc bulge with endplate osteophytic ridging. Mild facet
arthropathy. No canal stenosis. Partial effacement of the left
greater than right subarticular recesses. Mild to moderate foraminal
stenosis.
IMPRESSION: Multilevel degenerative changes as detailed above. There has been
progression at L4-L5 since the prior study.

## 2020-09-06 ENCOUNTER — Other Ambulatory Visit: Payer: Medicare Other

## 2020-09-07 DIAGNOSIS — M25571 Pain in right ankle and joints of right foot: Secondary | ICD-10-CM | POA: Diagnosis not present

## 2020-09-07 DIAGNOSIS — M25671 Stiffness of right ankle, not elsewhere classified: Secondary | ICD-10-CM | POA: Diagnosis not present

## 2020-09-07 DIAGNOSIS — M25572 Pain in left ankle and joints of left foot: Secondary | ICD-10-CM | POA: Diagnosis not present

## 2020-09-07 DIAGNOSIS — R531 Weakness: Secondary | ICD-10-CM | POA: Diagnosis not present

## 2020-09-07 DIAGNOSIS — R2689 Other abnormalities of gait and mobility: Secondary | ICD-10-CM | POA: Diagnosis not present

## 2020-09-07 DIAGNOSIS — M25672 Stiffness of left ankle, not elsewhere classified: Secondary | ICD-10-CM | POA: Diagnosis not present

## 2020-09-10 DIAGNOSIS — R202 Paresthesia of skin: Secondary | ICD-10-CM | POA: Diagnosis not present

## 2020-09-12 DIAGNOSIS — Z6824 Body mass index (BMI) 24.0-24.9, adult: Secondary | ICD-10-CM | POA: Diagnosis not present

## 2020-09-12 DIAGNOSIS — R202 Paresthesia of skin: Secondary | ICD-10-CM | POA: Diagnosis not present

## 2020-09-12 DIAGNOSIS — R030 Elevated blood-pressure reading, without diagnosis of hypertension: Secondary | ICD-10-CM | POA: Diagnosis not present

## 2020-09-18 DIAGNOSIS — M0609 Rheumatoid arthritis without rheumatoid factor, multiple sites: Secondary | ICD-10-CM | POA: Diagnosis not present

## 2020-10-16 DIAGNOSIS — Z79899 Other long term (current) drug therapy: Secondary | ICD-10-CM | POA: Diagnosis not present

## 2020-10-16 DIAGNOSIS — M0609 Rheumatoid arthritis without rheumatoid factor, multiple sites: Secondary | ICD-10-CM | POA: Diagnosis not present

## 2020-10-23 DIAGNOSIS — M5416 Radiculopathy, lumbar region: Secondary | ICD-10-CM | POA: Diagnosis not present

## 2020-10-30 ENCOUNTER — Ambulatory Visit: Payer: Medicare Other | Admitting: Podiatry

## 2020-11-01 ENCOUNTER — Ambulatory Visit: Payer: Medicare Other | Admitting: Podiatry

## 2020-11-12 ENCOUNTER — Other Ambulatory Visit: Payer: Self-pay

## 2020-11-12 ENCOUNTER — Ambulatory Visit (INDEPENDENT_AMBULATORY_CARE_PROVIDER_SITE_OTHER): Payer: Medicare Other | Admitting: Podiatry

## 2020-11-12 ENCOUNTER — Ambulatory Visit (INDEPENDENT_AMBULATORY_CARE_PROVIDER_SITE_OTHER): Payer: Medicare Other

## 2020-11-12 DIAGNOSIS — M19072 Primary osteoarthritis, left ankle and foot: Secondary | ICD-10-CM

## 2020-11-12 DIAGNOSIS — M960 Pseudarthrosis after fusion or arthrodesis: Secondary | ICD-10-CM | POA: Diagnosis not present

## 2020-11-13 DIAGNOSIS — M0609 Rheumatoid arthritis without rheumatoid factor, multiple sites: Secondary | ICD-10-CM | POA: Diagnosis not present

## 2020-11-14 DIAGNOSIS — R030 Elevated blood-pressure reading, without diagnosis of hypertension: Secondary | ICD-10-CM | POA: Diagnosis not present

## 2020-11-14 DIAGNOSIS — M48061 Spinal stenosis, lumbar region without neurogenic claudication: Secondary | ICD-10-CM | POA: Diagnosis not present

## 2020-11-14 DIAGNOSIS — M5416 Radiculopathy, lumbar region: Secondary | ICD-10-CM | POA: Diagnosis not present

## 2020-11-14 NOTE — Progress Notes (Signed)
Subjective: Marilyn Perez is a 70 y.o. is seen today in office s/p left foot subtalar joint arthrodesis, ankle arthroscopy preformed on 02/29/2020.  States the surgical site itself is been doing okay but her main concern is the ankle.  She states that swimming aggravates her ankle as well to which she squats and stands back up she gets some discomfort.  She gets some occasional swelling.  She states that she is back to walking wearing regular shoes. She may be having surgery in her back for the left leg nerve pain.  Objective: General: No acute distress, AAOx3  DP/PT pulses palpable 2/4, CRT < 3 sec to all digits.  Protective sensation intact. Motor function intact.  Left foot: Incision is well coapted without any evidence of dehiscence and scars have formed.  There is no significant discomfort of the surgical site on the subtalar joint.  She does get discomfort of the ankle joint there is some mild edema to the ankle itself but no significant swelling along the surgical site.  There is no erythema or warmth associate with the swelling.  No crepitation with range of motion.  MMT 5/5 Assessment and Plan:  Status post left foot surgery  -Treatment options discussed including all alternatives, risks, and complications -X-rays obtained reviewed.  Increase consolidation across the arthrodesis site of the subtalar joint. -I do think that her symptoms are coming from arthritis in the ankle.  Discussed when she is out in the yard and ankle brace as needed.  Discussed with her continue supportive shoe gear as well.  Offered steroid injection today but she wished to hold off on this.  Consider this in the future if needed.  Return if symptoms worsen or fail to improve.  Trula Slade DPM

## 2020-11-21 DIAGNOSIS — M5416 Radiculopathy, lumbar region: Secondary | ICD-10-CM | POA: Diagnosis not present

## 2020-11-21 DIAGNOSIS — R030 Elevated blood-pressure reading, without diagnosis of hypertension: Secondary | ICD-10-CM | POA: Diagnosis not present

## 2020-11-30 DIAGNOSIS — M5116 Intervertebral disc disorders with radiculopathy, lumbar region: Secondary | ICD-10-CM | POA: Diagnosis not present

## 2020-11-30 DIAGNOSIS — M48061 Spinal stenosis, lumbar region without neurogenic claudication: Secondary | ICD-10-CM | POA: Diagnosis not present

## 2020-11-30 DIAGNOSIS — M5416 Radiculopathy, lumbar region: Secondary | ICD-10-CM | POA: Diagnosis not present

## 2020-12-25 DIAGNOSIS — M2550 Pain in unspecified joint: Secondary | ICD-10-CM | POA: Diagnosis not present

## 2020-12-25 DIAGNOSIS — Z79899 Other long term (current) drug therapy: Secondary | ICD-10-CM | POA: Diagnosis not present

## 2020-12-25 DIAGNOSIS — Z6824 Body mass index (BMI) 24.0-24.9, adult: Secondary | ICD-10-CM | POA: Diagnosis not present

## 2020-12-25 DIAGNOSIS — M159 Polyosteoarthritis, unspecified: Secondary | ICD-10-CM | POA: Diagnosis not present

## 2020-12-25 DIAGNOSIS — M0609 Rheumatoid arthritis without rheumatoid factor, multiple sites: Secondary | ICD-10-CM | POA: Diagnosis not present

## 2020-12-25 DIAGNOSIS — M353 Polymyalgia rheumatica: Secondary | ICD-10-CM | POA: Diagnosis not present

## 2020-12-31 DIAGNOSIS — M0609 Rheumatoid arthritis without rheumatoid factor, multiple sites: Secondary | ICD-10-CM | POA: Diagnosis not present

## 2020-12-31 DIAGNOSIS — Z79899 Other long term (current) drug therapy: Secondary | ICD-10-CM | POA: Diagnosis not present

## 2021-01-28 DIAGNOSIS — M0609 Rheumatoid arthritis without rheumatoid factor, multiple sites: Secondary | ICD-10-CM | POA: Diagnosis not present

## 2021-03-14 ENCOUNTER — Other Ambulatory Visit: Payer: Self-pay

## 2021-03-14 ENCOUNTER — Ambulatory Visit (INDEPENDENT_AMBULATORY_CARE_PROVIDER_SITE_OTHER): Payer: Medicare Other | Admitting: Podiatry

## 2021-03-14 ENCOUNTER — Ambulatory Visit (INDEPENDENT_AMBULATORY_CARE_PROVIDER_SITE_OTHER): Payer: Medicare Other

## 2021-03-14 DIAGNOSIS — M79672 Pain in left foot: Secondary | ICD-10-CM | POA: Diagnosis not present

## 2021-03-14 DIAGNOSIS — M19072 Primary osteoarthritis, left ankle and foot: Secondary | ICD-10-CM

## 2021-03-14 DIAGNOSIS — M7752 Other enthesopathy of left foot: Secondary | ICD-10-CM

## 2021-03-14 DIAGNOSIS — M779 Enthesopathy, unspecified: Secondary | ICD-10-CM | POA: Diagnosis not present

## 2021-03-15 DIAGNOSIS — Z79899 Other long term (current) drug therapy: Secondary | ICD-10-CM | POA: Diagnosis not present

## 2021-03-15 DIAGNOSIS — M0609 Rheumatoid arthritis without rheumatoid factor, multiple sites: Secondary | ICD-10-CM | POA: Diagnosis not present

## 2021-03-17 NOTE — Progress Notes (Signed)
Subjective: 70 year old female presents the office today for concerns of left foot and ankle discomfort.  She states that she is having worsening pain and having difficulty walking.  Recent injury that she reports.  She recently had a back surgery and that has improved her symptoms from the neuro standpoint.  No other concerns.  Objective: AAO x3, NAD DP/PT pulses palpable bilaterally, CRT less than 3 seconds She does have some mild discomfort noted along the incision from the sinus tarsi to the majority discomfort on the fifth metatarsal base and fifth metatarsal.  She does get discomfort in the anterior aspect the ankle as well.  There is trace edema but there is no erythema or warmth.  Flexor, extensor tendons appear to be intact. No pain with calf compression, swelling, warmth, erythema  Assessment: Left foot, ankle pain  Plan: -All treatment options discussed with the patient including all alternatives, risks, complications.  -X-rays obtained reviewed left foot at this is where the majority of her discomfort is localized.  There is no evidence of acute fracture.  Spurring present fifth metatarsal base.  Hardware intact from prior surgeries. -She has known ankle arthritis as well.  At this point I discussed with her different options.  Discussed CT scan to evaluate the healing.  She is not interested in further surgery.  For now recommended physical therapy and referral was placed for this.  Also order compound cream to include gabapentin to help with the pain as well.  Continue with shoes and good arch support.  Shoes with a small he will actually do better for her.  Discussed wearing a heel lift inside of other shoes if needed. -Patient encouraged to call the office with any questions, concerns, change in symptoms.   Trula Slade DPM

## 2021-04-03 DIAGNOSIS — M25672 Stiffness of left ankle, not elsewhere classified: Secondary | ICD-10-CM | POA: Diagnosis not present

## 2021-04-03 DIAGNOSIS — M25572 Pain in left ankle and joints of left foot: Secondary | ICD-10-CM | POA: Diagnosis not present

## 2021-04-03 DIAGNOSIS — R269 Unspecified abnormalities of gait and mobility: Secondary | ICD-10-CM | POA: Diagnosis not present

## 2021-04-03 DIAGNOSIS — R531 Weakness: Secondary | ICD-10-CM | POA: Diagnosis not present

## 2021-04-04 DIAGNOSIS — F324 Major depressive disorder, single episode, in partial remission: Secondary | ICD-10-CM | POA: Diagnosis not present

## 2021-04-04 DIAGNOSIS — Z23 Encounter for immunization: Secondary | ICD-10-CM | POA: Diagnosis not present

## 2021-04-04 DIAGNOSIS — E538 Deficiency of other specified B group vitamins: Secondary | ICD-10-CM | POA: Diagnosis not present

## 2021-04-05 DIAGNOSIS — M25672 Stiffness of left ankle, not elsewhere classified: Secondary | ICD-10-CM | POA: Diagnosis not present

## 2021-04-05 DIAGNOSIS — R269 Unspecified abnormalities of gait and mobility: Secondary | ICD-10-CM | POA: Diagnosis not present

## 2021-04-05 DIAGNOSIS — R531 Weakness: Secondary | ICD-10-CM | POA: Diagnosis not present

## 2021-04-05 DIAGNOSIS — M25572 Pain in left ankle and joints of left foot: Secondary | ICD-10-CM | POA: Diagnosis not present

## 2021-04-08 DIAGNOSIS — R269 Unspecified abnormalities of gait and mobility: Secondary | ICD-10-CM | POA: Diagnosis not present

## 2021-04-08 DIAGNOSIS — M25672 Stiffness of left ankle, not elsewhere classified: Secondary | ICD-10-CM | POA: Diagnosis not present

## 2021-04-08 DIAGNOSIS — M25572 Pain in left ankle and joints of left foot: Secondary | ICD-10-CM | POA: Diagnosis not present

## 2021-04-08 DIAGNOSIS — R531 Weakness: Secondary | ICD-10-CM | POA: Diagnosis not present

## 2021-04-12 DIAGNOSIS — M0609 Rheumatoid arthritis without rheumatoid factor, multiple sites: Secondary | ICD-10-CM | POA: Diagnosis not present

## 2021-04-12 DIAGNOSIS — D649 Anemia, unspecified: Secondary | ICD-10-CM | POA: Diagnosis not present

## 2021-04-16 DIAGNOSIS — M25672 Stiffness of left ankle, not elsewhere classified: Secondary | ICD-10-CM | POA: Diagnosis not present

## 2021-04-16 DIAGNOSIS — M25572 Pain in left ankle and joints of left foot: Secondary | ICD-10-CM | POA: Diagnosis not present

## 2021-04-16 DIAGNOSIS — R531 Weakness: Secondary | ICD-10-CM | POA: Diagnosis not present

## 2021-04-16 DIAGNOSIS — R269 Unspecified abnormalities of gait and mobility: Secondary | ICD-10-CM | POA: Diagnosis not present

## 2021-04-25 ENCOUNTER — Ambulatory Visit (INDEPENDENT_AMBULATORY_CARE_PROVIDER_SITE_OTHER): Payer: Medicare Other | Admitting: Podiatry

## 2021-04-25 ENCOUNTER — Other Ambulatory Visit: Payer: Self-pay

## 2021-04-25 DIAGNOSIS — M19072 Primary osteoarthritis, left ankle and foot: Secondary | ICD-10-CM

## 2021-04-25 DIAGNOSIS — M779 Enthesopathy, unspecified: Secondary | ICD-10-CM | POA: Diagnosis not present

## 2021-04-25 DIAGNOSIS — M79672 Pain in left foot: Secondary | ICD-10-CM

## 2021-04-26 NOTE — Progress Notes (Signed)
Subjective: 70 year old female presents the office today for concerns of left foot and ankle discomfort.  She states that physical therapy has not been helpful.  He may try to inversion and eversion causes pain.  She states her pain level is 4/10.  Her ankle is not limiting her activities.  She still using gabapentin compound cream which is helpful.  No new injuries or concerns today.  Objective: AAO x3, NAD DP/PT pulses palpable bilaterally, CRT less than 3 seconds There is mild discomfort present along the sinus tarsi and the lateral aspect left ankle.  There is no significant pain on the peroneal tendons or flexor tendons.  No area pinpoint tenderness.  Trace edema to the area there is no erythema or warmth associated with this.  MMT 5/5. No pain with calf compression, swelling, warmth, erythema  Assessment: Left foot, ankle pain  Plan: -All treatment options discussed with the patient including all alternatives, risks, complications.  -At this point discontinue physical therapy.  Discussed with her continue with home rehab exercises.  Continue shoes with good arch support as well.  Ankle brace if needed which she uses if she is on her feet a long time.   Return if symptoms worsen or fail to improve.  Marilyn Perez DPM

## 2021-05-09 DIAGNOSIS — F331 Major depressive disorder, recurrent, moderate: Secondary | ICD-10-CM | POA: Diagnosis not present

## 2021-05-09 DIAGNOSIS — E538 Deficiency of other specified B group vitamins: Secondary | ICD-10-CM | POA: Diagnosis not present

## 2021-05-10 DIAGNOSIS — M0609 Rheumatoid arthritis without rheumatoid factor, multiple sites: Secondary | ICD-10-CM | POA: Diagnosis not present

## 2021-06-05 ENCOUNTER — Other Ambulatory Visit: Payer: Self-pay

## 2021-06-05 ENCOUNTER — Emergency Department (HOSPITAL_COMMUNITY)
Admission: EM | Admit: 2021-06-05 | Discharge: 2021-06-05 | Disposition: A | Discharge status: Home / Self Care | Payer: Medicare Other | Attending: Emergency Medicine | Admitting: Emergency Medicine

## 2021-06-05 ENCOUNTER — Encounter (HOSPITAL_COMMUNITY): Payer: Self-pay

## 2021-06-05 DIAGNOSIS — M79605 Pain in left leg: Secondary | ICD-10-CM | POA: Diagnosis not present

## 2021-06-05 DIAGNOSIS — M5416 Radiculopathy, lumbar region: Secondary | ICD-10-CM | POA: Diagnosis not present

## 2021-06-05 DIAGNOSIS — I10 Essential (primary) hypertension: Secondary | ICD-10-CM | POA: Diagnosis not present

## 2021-06-05 DIAGNOSIS — M549 Dorsalgia, unspecified: Secondary | ICD-10-CM | POA: Diagnosis not present

## 2021-06-05 DIAGNOSIS — R609 Edema, unspecified: Secondary | ICD-10-CM | POA: Diagnosis not present

## 2021-06-05 DIAGNOSIS — M545 Low back pain, unspecified: Secondary | ICD-10-CM | POA: Diagnosis not present

## 2021-06-05 DIAGNOSIS — M5410 Radiculopathy, site unspecified: Secondary | ICD-10-CM

## 2021-06-05 MED ORDER — LIDOCAINE 4 % EX PTCH: 1.0000 | MEDICATED_PATCH | Freq: Two times a day (BID) | CUTANEOUS | 0 refills | Status: DC

## 2021-06-05 MED ORDER — DIAZEPAM 2 MG PO TABS
2.0000 mg | ORAL_TABLET | Freq: Once | ORAL | Status: AC
  Administered 2021-06-05: 2 mg via ORAL
  Filled 2021-06-05: qty 1

## 2021-06-05 MED ORDER — HYDROCODONE-ACETAMINOPHEN 5-325 MG PO TABS
1.0000 | ORAL_TABLET | Freq: Once | ORAL | Status: AC
  Administered 2021-06-05: 1 via ORAL
  Filled 2021-06-05: qty 1

## 2021-06-05 MED ORDER — LIDOCAINE 5 % EX PTCH
1.0000 | MEDICATED_PATCH | CUTANEOUS | Status: DC
  Administered 2021-06-05: 1 via TRANSDERMAL
  Filled 2021-06-05: qty 1

## 2021-06-05 MED ORDER — TIZANIDINE HCL 4 MG PO TABS: 4.0000 mg | ORAL_TABLET | Freq: Four times a day (QID) | ORAL | 0 refills | Status: DC | PRN

## 2021-06-05 MED ORDER — FENTANYL CITRATE PF 50 MCG/ML IJ SOSY
75.0000 ug | PREFILLED_SYRINGE | Freq: Once | INTRAMUSCULAR | Status: AC
  Administered 2021-06-05: 75 ug via INTRAVENOUS
  Filled 2021-06-05: qty 2

## 2021-06-05 MED ORDER — DEXAMETHASONE SODIUM PHOSPHATE 10 MG/ML IJ SOLN
10.0000 mg | Freq: Once | INTRAMUSCULAR | Status: AC
  Administered 2021-06-05: 10 mg via INTRAVENOUS
  Filled 2021-06-05: qty 1

## 2021-06-05 MED ORDER — KETOROLAC TROMETHAMINE 15 MG/ML IJ SOLN
15.0000 mg | Freq: Once | INTRAMUSCULAR | Status: AC
  Administered 2021-06-05: 15 mg via INTRAVENOUS
  Filled 2021-06-05: qty 1

## 2021-06-05 MED ORDER — PREDNISONE 10 MG PO TABS: 50.0000 mg | ORAL_TABLET | Freq: Every day | ORAL | 0 refills | Status: DC

## 2021-06-05 NOTE — ED Notes (Signed)
Pt states that over the past 2 days her pain in the back and left leg have come unbearable. Pt on side for comfort. Denies trauma

## 2021-06-05 NOTE — ED Triage Notes (Signed)
Patient BIB EMS from home with c/o back pain that radiates into her groin and down through her left leg. EMS reports pt unable to stand on left leg. Hx sciatica   EMS vitals: BP 180/100 HR 100 SPO2 97%

## 2021-06-05 NOTE — Discharge Instructions (Addendum)
Please read the instructions provided on radicular pain, which is what we suspect you have. Continue taking Vicodin when the pain is severe. Over-the-counter ibuprofen and Tylenol should be used as supplements.   We have prescribed you with a topical lidocaine, Zanaflex which is a muscle relaxant for additional relief if needed. A short course of prednisone also has been ordered to further reduce inflammation.

## 2021-06-05 NOTE — ED Provider Notes (Signed)
°[facility]-EMERGENCY DEPT °Provider Note ° ° °CSN: [id number] °Arrival date & time: 06/05/21  0652 ° °  ° °History ° °Chief Complaint  °Patient presents with  ° Back Pain  ° ° °Marilyn Perez is a 71 y.o. female. ° °HPI ° °  ° °71-year-old female comes in with chief complaint of back pain.  She indicates that she has history of chronic back pain.  She had a surgery done last year, the pain did improve for short duration but started coming back after.  Her current pain is similar to her prior flareups, however it is more intense than usual flareups.  The pain is located in the lower back, but is more pronounced over her left hip and it radiates down to her left groin and towards the foot.  There is no associated numbness, tingling, urinary incontinence, urinary retention, saddle anesthesia.  Patient has taken Norco at home without significant relief.  2 days ago she was volunteering at her church and was on her feet for long duration, she suspects that that could have triggered this episode. ° °Home Medications °Prior to Admission medications   °Medication Sig Start Date End Date Taking? Authorizing Provider  °Lidocaine 4 % PTCH Apply 1 patch topically 2 (two) times daily. 06/05/21  Yes , , MD  °predniSONE (DELTASONE) 10 MG tablet Take 5 tablets (50 mg total) by mouth daily. 06/06/21  Yes , , MD  °tiZANidine (ZANAFLEX) 4 MG tablet Take 1 tablet (4 mg total) by mouth every 6 (six) hours as needed for muscle spasms. 06/05/21  Yes , , MD  °Vitamin D, Ergocalciferol, (DRISDOL) 1.25 MG (50000 UNIT) CAPS capsule Take 1 capsule (50,000 Units total) by mouth every 7 (seven) days. 05/17/20   Wagoner, Matthew R, DPM  °celecoxib (CELEBREX) 200 MG capsule Take 200 mg by mouth 2 (two) times daily. 11/23/19   [provider]  °Certolizumab Pegol (CIMZIA STARTER KIT) 6 X 200 MG/ML KIT Inject into the skin.    [provider]  °CHANTIX STARTING MONTH PAK 0.5 MG  X 11 & 1 MG X 42 tablet USE AS DIRECTED FOR 30 DAYS 08/10/19   [provider]  °clindamycin (CLEOCIN) 300 MG capsule Take 1 capsule (300 mg total) by mouth 3 (three) times daily. 02/29/20   Wagoner, Matthew R, DPM  °clobetasol (TEMOVATE) 0.05 % GEL Apply topically. 02/27/20   [provider]  °Cyanocobalamin (VITAMIN B-12 IJ) Inject as directed every 30 (thirty) days.    [provider]  °DULoxetine (CYMBALTA) 60 MG capsule Take 60 mg by mouth daily.    [provider]  °gabapentin (NEURONTIN) 100 MG capsule Take 1 capsule by mouth at bedtime 08/16/20   Price, Michael J, DPM  °HYDROcodone-acetaminophen (NORCO/VICODIN) 5-325 MG tablet TAKE 1 2 TO 1 (ONE HALF TO ONE) TABLET BY MOUTH ONCE DAILY AS NEEDED 06/27/19   [provider]  °HYDROcodone-acetaminophen (NORCO/VICODIN) 5-325 MG tablet Take 1 tablet by mouth every 6 (six) hours as needed. 03/05/20   Wagoner, Matthew R, DPM  °leflunomide (ARAVA) 20 MG tablet Take 20 mg by mouth daily.    [provider]  °Misc Natural Products (MIDNITE PO) Take 2 tablets by mouth at bedtime.    [provider]  °omeprazole (PRILOSEC) 20 MG capsule 1 capsule    [provider]  °oxyCODONE-acetaminophen (PERCOCET/ROXICET) 5-325 MG tablet Take 1-2 tablets by mouth every 6 (six) hours as needed for severe pain. 02/29/20   Wagoner, Matthew R,   DPM  °promethazine (PHENERGAN) 25 MG tablet Take 1 tablet (25 mg total) by mouth every 8 (eight) hours as needed for nausea or vomiting. 02/29/20   Wagoner, Matthew R, DPM  °rOPINIRole (REQUIP) 0.5 MG tablet Take 1 mg by mouth at bedtime.    [provider]  °rOPINIRole (REQUIP) 0.5 MG tablet 2 tablets    [provider]  °SF 5000 PLUS 1.1 % CREA dental cream USE ONCE DAILY AT BEDTIME IN PLACE OF REGULAR TOOTHPASTE. BRUSH FOR 2 MINUTES AND SPIT. DO NOT RINSE 08/01/19   [provider]  °traMADol (ULTRAM) 50 MG tablet  06/14/19   [provider]   °varenicline (CHANTIX) 0.5 MG tablet     [provider]  °Vitamin D, Ergocalciferol, (DRISDOL) 1.25 MG (50000 UNIT) CAPS capsule Take 1 capsule (50,000 Units total) by mouth every 7 (seven) days. 06/21/20   Wagoner, Matthew R, DPM  °   ° °Allergies    °Oxycodone-acetaminophen, Penicillamine, Codeine, Penicillins, Remicade [infliximab], Sulfa antibiotics, and Tocilizumab   ° °Review of Systems   °Review of Systems  °Constitutional:  Positive for activity change.  °Musculoskeletal:  Positive for back pain.  °All other systems reviewed and are negative. ° °Physical Exam °Updated Vital Signs °BP (!) 146/84    Pulse 81    Temp 97.8 °F (36.6 °C) (Oral)    Resp 17    Wt 72.6 kg    SpO2 93%    BMI 26.63 kg/m²  °Physical Exam °Vitals and nursing note reviewed.  °Constitutional:   °   Appearance: She is well-developed.  °HENT:  °   Head: Atraumatic.  °Cardiovascular:  °   Rate and Rhythm: Normal rate.  °Pulmonary:  °   Effort: Pulmonary effort is normal.  °Musculoskeletal:  °   Cervical back: Normal range of motion and neck supple.  °   Comments: Pt has tenderness over the lumbar region °No step offs, no erythema. °Able to discriminate between sharp and dull over the lower extremities °Positive passive leg raise of the left lower extremity °  °Skin: °   General: Skin is warm and dry.  °Neurological:  °   Mental Status: She is alert and oriented to person, place, and time.  ° ° °ED Results / Procedures / Treatments   °Labs °(all labs ordered are listed, but only abnormal results are displayed) °Labs Reviewed - No data to display ° °EKG °None ° °Radiology °No results found. ° °Procedures °Procedures  ° ° °Medications Ordered in ED °Medications  °lidocaine (LIDODERM) 5 % 1 patch (1 patch Transdermal Patch Applied 06/05/21 0854)  °dexamethasone (DECADRON) injection 10 mg (10 mg Intravenous Given 06/05/21 0742)  °ketorolac (TORADOL) 15 MG/ML injection 15 mg (15 mg Intravenous Given 06/05/21 0742)  °fentaNYL (SUBLIMAZE)  injection 75 mcg (75 mcg Intravenous Given 06/05/21 0742)  °diazepam (VALIUM) tablet 2 mg (2 mg Oral Given 06/05/21 0749)  °HYDROcodone-acetaminophen (NORCO/VICODIN) 5-325 MG per tablet 1 tablet (1 tablet Oral Given 06/05/21 0854)  ° ° °ED Course/ Medical Decision Making/ A&P °Clinical Course as of 06/05/21 0913  °Wed Jun 05, 2021  °0912 Patient reassessed.  Feels a lot better than she was prior to the medication.  She will receive additional dose of medications in the ED prior to discharge.  Advised PCP or spine surgeon follow-up [AN]  °  °Clinical Course User Index °[AN] , , MD  ° °                        °  Medical Decision Making 71 year old female comes in with chief complaint of back pain.  Pain is radiating down her left leg.  She has history of chronic degenerative spine disease status post surgical management last year.  Given her age and past history, the complaint carries higher complexity.  Differential diagnosis includes herniated disc, lumbar radiculopathy because of spinal stenosis, sciatica/nerve impingement, cauda equina.  Clinical symptoms are not consistent with cauda equina syndrome.  Neurovascularly patient is intact which is reassuring.  I have reviewed patient's prior MRIs, most recently from May 2022.  Clear degenerative spine disease and disc disease as demonstrated on it.  Therefore, I considered x-rays but do not see any reason to order it at this time.  Plan will be to control her symptoms with IV fentanyl, IV Toradol and she will also receive IV dexamethasone.    Problems Addressed: Radicular pain of left lower extremity: chronic illness or injury with exacerbation, progression, or side effects of treatment  Amount and/or Complexity of Data Reviewed External Data Reviewed: radiology.    Details: FINDINGS: Segmentation:  Standard.   Alignment:  New trace anterolisthesis at L4-L5.   Vertebrae: Vertebral body heights are maintained. There is no marrow edema. No  suspicious osseous lesion. Vertebral body hemangioma at T12.   Conus medullaris and cauda equina: Conus extends to the L1 level. Conus and cauda equina appear normal.   Paraspinal and other soft tissues: Unremarkable   Disc levels: Disc space narrowing is greatest at L5-S1.   L1-L2:  Trace disc bulge.  No canal or foraminal stenosis.   L2-L3:  Trace disc bulge.  No canal or foraminal stenosis.   L3-L4:  Trace disc bulge.  No canal or foraminal stenosis.   L4-L5: Anterolisthesis with uncovering of disc bulge. Facet arthropathy with ligamentum flavum infolding and joint effusions. Moderate canal stenosis. Effacement of the subarticular recesses. Mild to moderate, left greater than right foraminal stenosis.   L5-S1: Disc bulge with endplate osteophytic ridging. Mild facet arthropathy. No canal stenosis. Partial effacement of the left greater than right subarticular recesses. Mild to moderate foraminal stenosis.   IMPRESSION: Multilevel degenerative changes as detailed above. There has been progression at L4-L5 since the prior study. Radiology:  Decision-making details documented in ED Course.    Details: Considered ordering x-rays, but have reviewed patient's previous charts and do not see any indication for it  Risk OTC drugs. Prescription drug management. Parenteral controlled substances.    Final Clinical Impression(s) / ED Diagnoses Final diagnoses:  Radicular pain of left lower extremity    Rx / DC Orders ED Discharge Orders          Ordered    predniSONE (DELTASONE) 10 MG tablet  Daily        06/05/21 0842    Lidocaine 4 % PTCH  2 times daily        06/05/21 0842    tiZANidine (ZANAFLEX) 4 MG tablet  Every 6 hours PRN        06/05/21 0843              Varney Biles, MD 06/05/21 [phone]

## 2021-06-07 DIAGNOSIS — M0609 Rheumatoid arthritis without rheumatoid factor, multiple sites: Secondary | ICD-10-CM | POA: Diagnosis not present

## 2021-06-13 ENCOUNTER — Other Ambulatory Visit: Payer: Self-pay | Admitting: Neurosurgery

## 2021-06-13 DIAGNOSIS — R030 Elevated blood-pressure reading, without diagnosis of hypertension: Secondary | ICD-10-CM | POA: Diagnosis not present

## 2021-06-13 DIAGNOSIS — Z6825 Body mass index (BMI) 25.0-25.9, adult: Secondary | ICD-10-CM | POA: Diagnosis not present

## 2021-06-13 DIAGNOSIS — M5416 Radiculopathy, lumbar region: Secondary | ICD-10-CM | POA: Diagnosis not present

## 2021-06-25 ENCOUNTER — Ambulatory Visit
Admission: RE | Admit: 2021-06-25 | Discharge: 2021-06-25 | Disposition: A | Discharge status: Home / Self Care | Payer: Medicare Other | Source: Ambulatory Visit | Attending: Neurosurgery | Admitting: Neurosurgery

## 2021-06-25 ENCOUNTER — Other Ambulatory Visit: Payer: Self-pay

## 2021-06-25 DIAGNOSIS — M545 Low back pain, unspecified: Secondary | ICD-10-CM | POA: Diagnosis not present

## 2021-06-25 DIAGNOSIS — M5416 Radiculopathy, lumbar region: Secondary | ICD-10-CM

## 2021-06-26 DIAGNOSIS — M4316 Spondylolisthesis, lumbar region: Secondary | ICD-10-CM | POA: Diagnosis not present

## 2021-06-26 DIAGNOSIS — M5126 Other intervertebral disc displacement, lumbar region: Secondary | ICD-10-CM | POA: Diagnosis not present

## 2021-06-26 DIAGNOSIS — Z6826 Body mass index (BMI) 26.0-26.9, adult: Secondary | ICD-10-CM | POA: Diagnosis not present

## 2021-06-26 DIAGNOSIS — R030 Elevated blood-pressure reading, without diagnosis of hypertension: Secondary | ICD-10-CM | POA: Diagnosis not present

## 2021-06-27 DIAGNOSIS — M353 Polymyalgia rheumatica: Secondary | ICD-10-CM | POA: Diagnosis not present

## 2021-06-27 DIAGNOSIS — Z6827 Body mass index (BMI) 27.0-27.9, adult: Secondary | ICD-10-CM | POA: Diagnosis not present

## 2021-06-27 DIAGNOSIS — M0609 Rheumatoid arthritis without rheumatoid factor, multiple sites: Secondary | ICD-10-CM | POA: Diagnosis not present

## 2021-06-27 DIAGNOSIS — E663 Overweight: Secondary | ICD-10-CM | POA: Diagnosis not present

## 2021-06-27 DIAGNOSIS — Z79899 Other long term (current) drug therapy: Secondary | ICD-10-CM | POA: Diagnosis not present

## 2021-06-27 DIAGNOSIS — M5432 Sciatica, left side: Secondary | ICD-10-CM | POA: Diagnosis not present

## 2021-06-27 DIAGNOSIS — M159 Polyosteoarthritis, unspecified: Secondary | ICD-10-CM | POA: Diagnosis not present

## 2021-06-27 DIAGNOSIS — M2550 Pain in unspecified joint: Secondary | ICD-10-CM | POA: Diagnosis not present

## 2021-07-02 ENCOUNTER — Other Ambulatory Visit: Payer: Self-pay | Admitting: Neurosurgery

## 2021-07-03 ENCOUNTER — Other Ambulatory Visit: Payer: Self-pay | Admitting: Neurosurgery

## 2021-07-05 DIAGNOSIS — Z111 Encounter for screening for respiratory tuberculosis: Secondary | ICD-10-CM | POA: Diagnosis not present

## 2021-07-05 DIAGNOSIS — R5383 Other fatigue: Secondary | ICD-10-CM | POA: Diagnosis not present

## 2021-07-05 DIAGNOSIS — Z79899 Other long term (current) drug therapy: Secondary | ICD-10-CM | POA: Diagnosis not present

## 2021-07-05 DIAGNOSIS — M0609 Rheumatoid arthritis without rheumatoid factor, multiple sites: Secondary | ICD-10-CM | POA: Diagnosis not present

## 2021-07-08 ENCOUNTER — Encounter (HOSPITAL_COMMUNITY): Payer: Self-pay | Admitting: Neurosurgery

## 2021-07-08 ENCOUNTER — Other Ambulatory Visit: Payer: Self-pay

## 2021-07-08 NOTE — Progress Notes (Signed)
DUE TO COVID-19 ONLY ONE VISITOR IS ALLOWED TO COME WITH YOU AND STAY IN THE WAITING ROOM ONLY DURING PRE OP AND PROCEDURE DAY OF SURGERY.  ? ?Two VISITORS MAY VISIT WITH YOU AFTER SURGERY IN YOUR PRIVATE ROOM DURING VISITING HOURS ONLY! ? ?PCP - Dr Seward Carol ?Cardiologist - n/a ? ?Chest x-ray - n/a ?EKG - n/a ?Stress Test - 10/21/14 ?ECHO - n/a ?Cardiac Cath - n/a ? ?ICD Pacemaker/Loop - n/a ? ?Sleep Study -  n/a ?CPAP - none ? ?STOP now taking any Aspirin (unless otherwise instructed by your surgeon), Aleve, Naproxen, Ibuprofen, Motrin, Advil, Goody's, BC's, all herbal medications, fish oil, and all vitamins.  ? ?Coronavirus Screening ?Covid test is scheduled on DOS ?Do you have any of the following symptoms:  ?Cough yes/no: No ?Fever (>100.60F)  yes/no: No ?Runny nose yes/no: No ?Sore throat yes/no: No ?Difficulty breathing/shortness of breath  yes/no: No ? ?Have you traveled in the last 14 days and where? yes/no: No ? ?Patient verbalized understanding of instructions that were given via phone. ?

## 2021-07-09 ENCOUNTER — Encounter (HOSPITAL_COMMUNITY)
Admission: RE | Disposition: A | Discharge status: Home / Self Care | Payer: Self-pay | Source: Home / Self Care | Attending: Neurosurgery

## 2021-07-09 ENCOUNTER — Observation Stay (HOSPITAL_COMMUNITY)
Admission: RE | Admit: 2021-07-09 | Discharge: 2021-07-10 | Disposition: A | Discharge status: Home / Self Care | Payer: Medicare Other | Attending: Neurosurgery | Admitting: Neurosurgery

## 2021-07-09 ENCOUNTER — Ambulatory Visit (HOSPITAL_COMMUNITY): Payer: Medicare Other | Admitting: Critical Care Medicine

## 2021-07-09 ENCOUNTER — Other Ambulatory Visit: Payer: Self-pay

## 2021-07-09 ENCOUNTER — Ambulatory Visit (HOSPITAL_BASED_OUTPATIENT_CLINIC_OR_DEPARTMENT_OTHER): Payer: Medicare Other | Admitting: Critical Care Medicine

## 2021-07-09 ENCOUNTER — Encounter (HOSPITAL_COMMUNITY): Payer: Self-pay | Admitting: Neurosurgery

## 2021-07-09 ENCOUNTER — Ambulatory Visit (HOSPITAL_COMMUNITY): Payer: Medicare Other

## 2021-07-09 DIAGNOSIS — M069 Rheumatoid arthritis, unspecified: Secondary | ICD-10-CM | POA: Diagnosis not present

## 2021-07-09 DIAGNOSIS — M8580 Other specified disorders of bone density and structure, unspecified site: Secondary | ICD-10-CM | POA: Insufficient documentation

## 2021-07-09 DIAGNOSIS — F419 Anxiety disorder, unspecified: Secondary | ICD-10-CM | POA: Diagnosis not present

## 2021-07-09 DIAGNOSIS — M5136 Other intervertebral disc degeneration, lumbar region: Secondary | ICD-10-CM | POA: Insufficient documentation

## 2021-07-09 DIAGNOSIS — Z79899 Other long term (current) drug therapy: Secondary | ICD-10-CM | POA: Diagnosis not present

## 2021-07-09 DIAGNOSIS — M4316 Spondylolisthesis, lumbar region: Secondary | ICD-10-CM | POA: Diagnosis not present

## 2021-07-09 DIAGNOSIS — F32A Depression, unspecified: Secondary | ICD-10-CM | POA: Diagnosis not present

## 2021-07-09 DIAGNOSIS — Z87891 Personal history of nicotine dependence: Secondary | ICD-10-CM | POA: Diagnosis not present

## 2021-07-09 DIAGNOSIS — M5126 Other intervertebral disc displacement, lumbar region: Secondary | ICD-10-CM

## 2021-07-09 DIAGNOSIS — Z981 Arthrodesis status: Secondary | ICD-10-CM | POA: Insufficient documentation

## 2021-07-09 DIAGNOSIS — Z20822 Contact with and (suspected) exposure to covid-19: Secondary | ICD-10-CM | POA: Diagnosis not present

## 2021-07-09 DIAGNOSIS — M79605 Pain in left leg: Secondary | ICD-10-CM | POA: Insufficient documentation

## 2021-07-09 DIAGNOSIS — Z9889 Other specified postprocedural states: Secondary | ICD-10-CM | POA: Diagnosis not present

## 2021-07-09 DIAGNOSIS — Z419 Encounter for procedure for purposes other than remedying health state, unspecified: Secondary | ICD-10-CM

## 2021-07-09 HISTORY — DX: Personal history of urinary calculi: Z87.442

## 2021-07-09 HISTORY — DX: Depression, unspecified: F32.A

## 2021-07-09 LAB — CBC
HCT: 33 % — ABNORMAL LOW (ref 36.0–46.0)
Hemoglobin: 11 g/dL — ABNORMAL LOW (ref 12.0–15.0)
MCH: 30.2 pg (ref 26.0–34.0)
MCHC: 33.3 g/dL (ref 30.0–36.0)
MCV: 90.7 fL (ref 80.0–100.0)
Platelets: 156 10*3/uL (ref 150–400)
RBC: 3.64 MIL/uL — ABNORMAL LOW (ref 3.87–5.11)
RDW: 13.6 % (ref 11.5–15.5)
WBC: 4.9 10*3/uL (ref 4.0–10.5)
nRBC: 0 % (ref 0.0–0.2)

## 2021-07-09 LAB — ABO/RH: ABO/RH(D): A POS

## 2021-07-09 LAB — SURGICAL PCR SCREEN
MRSA, PCR: NEGATIVE
Staphylococcus aureus: NEGATIVE

## 2021-07-09 LAB — TYPE AND SCREEN
ABO/RH(D): A POS
Antibody Screen: NEGATIVE

## 2021-07-09 LAB — SARS CORONAVIRUS 2 BY RT PCR (HOSPITAL ORDER, PERFORMED IN ~~LOC~~ HOSPITAL LAB): SARS Coronavirus 2: NEGATIVE

## 2021-07-09 IMAGING — RF DG LUMBAR SPINE 2-3V
1 series · 2 of 2 positions shown · non-contrast
Comparison: None.

CLINICAL DATA: L4-5 PLIF

EXAM:
LUMBAR SPINE - 2-3 VIEW

[Series 1: run · 2 of 2 slices shown]
[im 1/2]
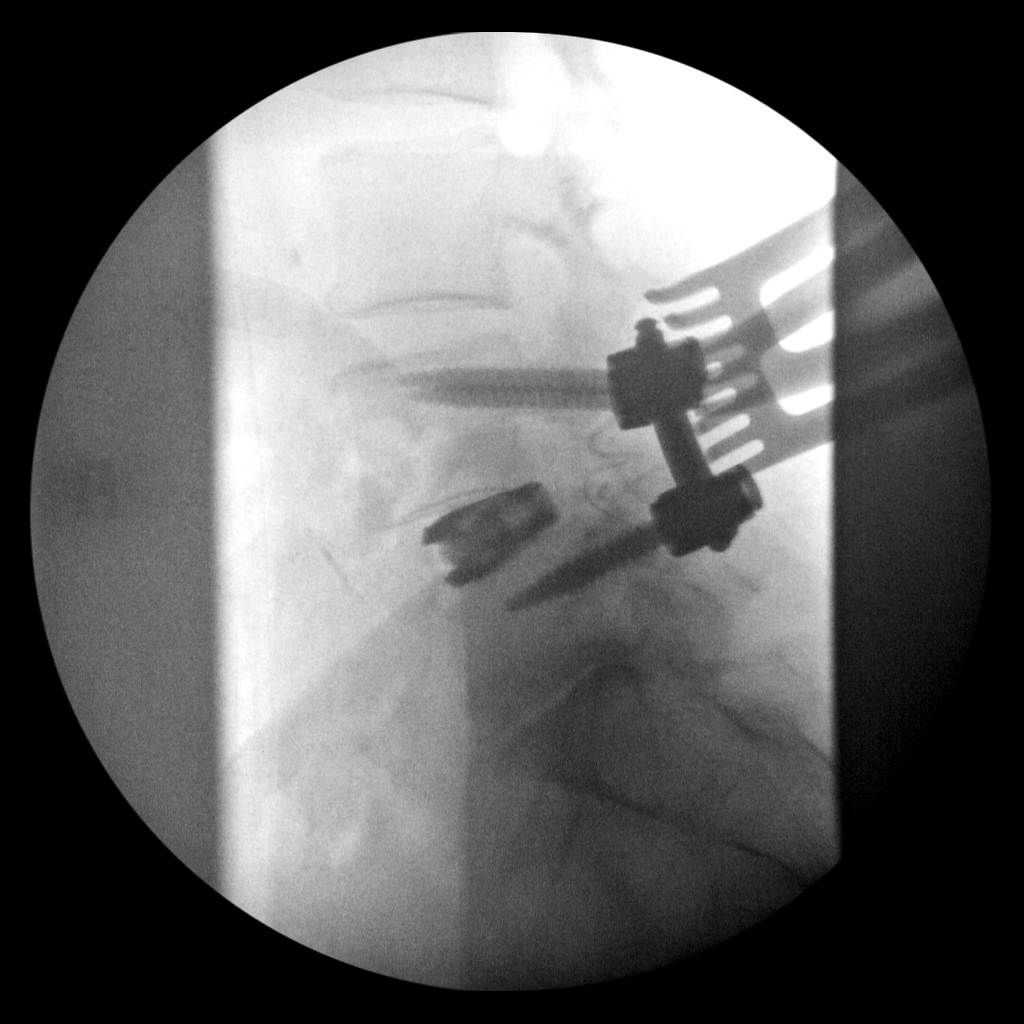
[im 2/2]
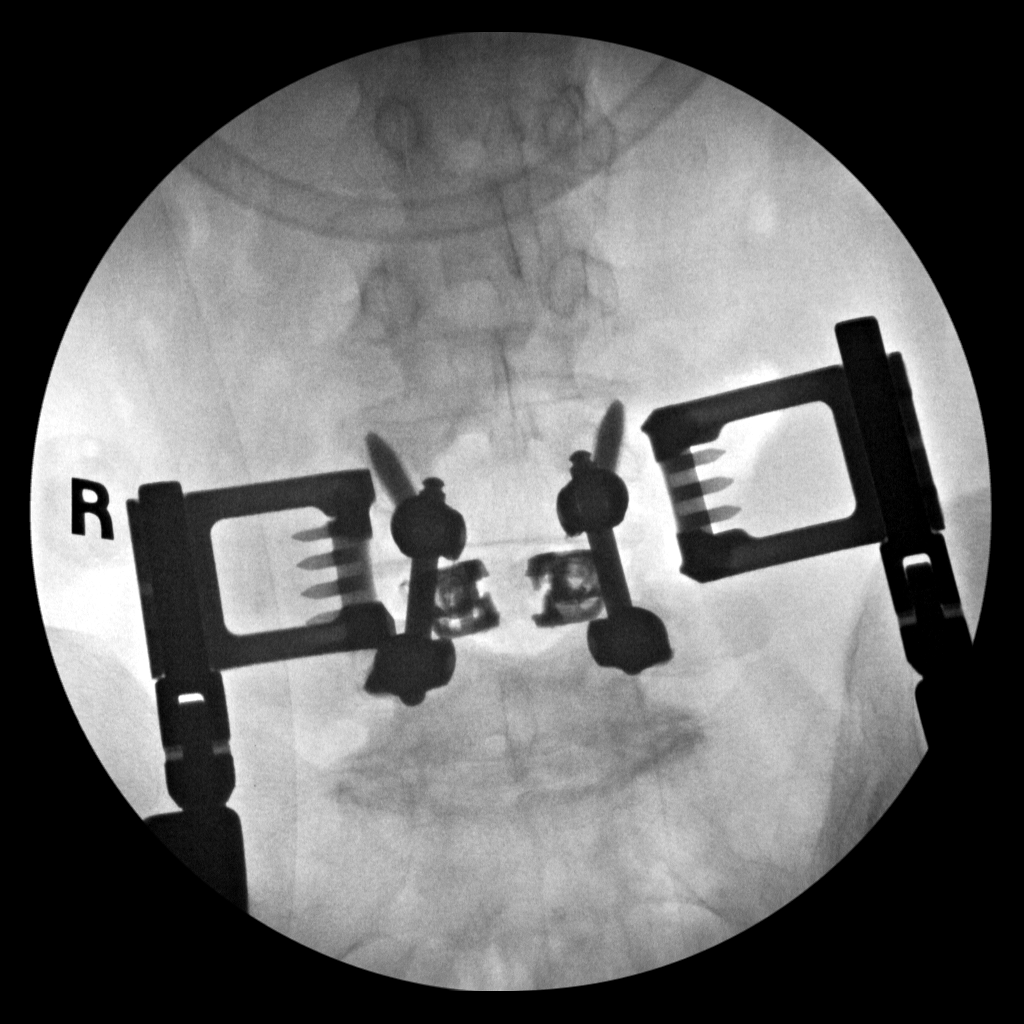

[2 of 2 positions shown; findings below may reference images not displayed]

FINDINGS: Multiple intraoperative fluoroscopic spot images are provided.
Interval L4-5 PLIF.

FLUOROSCOPY TIME:  Radiation Exposure Index: 27.8 mGy
IMPRESSION: 1. Interval L4-5 PLIF.

## 2021-07-09 SURGERY — POSTERIOR LUMBAR FUSION 1 LEVEL
Anesthesia: General | Site: Spine Lumbar

## 2021-07-09 MED ORDER — HYDROCODONE-ACETAMINOPHEN 10-325 MG PO TABS
1.0000 | ORAL_TABLET | ORAL | Status: DC | PRN
  Administered 2021-07-09: 1 via ORAL
  Filled 2021-07-09: qty 1

## 2021-07-09 MED ORDER — HYDROMORPHONE HCL 1 MG/ML IJ SOLN
0.2500 mg | INTRAMUSCULAR | Status: DC | PRN
  Administered 2021-07-09: 0.5 mg via INTRAVENOUS
  Administered 2021-07-09: 0.25 mg via INTRAVENOUS
  Administered 2021-07-09 (×2): 0.5 mg via INTRAVENOUS
  Administered 2021-07-09: 0.25 mg via INTRAVENOUS

## 2021-07-09 MED ORDER — ONDANSETRON HCL 4 MG/2ML IJ SOLN
4.0000 mg | Freq: Four times a day (QID) | INTRAMUSCULAR | Status: DC | PRN
  Administered 2021-07-09: 4 mg via INTRAVENOUS
  Filled 2021-07-09: qty 2

## 2021-07-09 MED ORDER — PREDNISONE 50 MG PO TABS
50.0000 mg | ORAL_TABLET | Freq: Every day | ORAL | Status: DC
  Administered 2021-07-09 – 2021-07-10 (×2): 50 mg via ORAL
  Filled 2021-07-09 (×2): qty 1

## 2021-07-09 MED ORDER — METHOCARBAMOL 500 MG PO TABS
ORAL_TABLET | ORAL | Status: AC
  Filled 2021-07-09: qty 1

## 2021-07-09 MED ORDER — ACETAMINOPHEN 325 MG PO TABS: 325.0000 mg | ORAL_TABLET | Freq: Once | ORAL | Status: DC | PRN

## 2021-07-09 MED ORDER — ROCURONIUM BROMIDE 10 MG/ML (PF) SYRINGE
PREFILLED_SYRINGE | INTRAVENOUS | Status: AC
  Filled 2021-07-09: qty 20

## 2021-07-09 MED ORDER — AMISULPRIDE (ANTIEMETIC) 5 MG/2ML IV SOLN: 10.0000 mg | Freq: Once | INTRAVENOUS | Status: DC | PRN

## 2021-07-09 MED ORDER — FENTANYL CITRATE (PF) 250 MCG/5ML IJ SOLN
INTRAMUSCULAR | Status: DC | PRN
  Administered 2021-07-09 (×8): 50 ug via INTRAVENOUS

## 2021-07-09 MED ORDER — SODIUM FLUORIDE 1.1 % DT CREA: 1.0000 "application " | TOPICAL_CREAM | Freq: Every day | DENTAL | Status: DC

## 2021-07-09 MED ORDER — PHENYLEPHRINE HCL-NACL 20-0.9 MG/250ML-% IV SOLN
INTRAVENOUS | Status: DC | PRN
  Administered 2021-07-09: 30 ug/min via INTRAVENOUS

## 2021-07-09 MED ORDER — 0.9 % SODIUM CHLORIDE (POUR BTL) OPTIME
TOPICAL | Status: DC | PRN
Start: 2021-07-09 — End: 2021-07-09
  Administered 2021-07-09: 1000 mL

## 2021-07-09 MED ORDER — LIDOCAINE 4 % EX PTCH: 1.0000 | MEDICATED_PATCH | Freq: Two times a day (BID) | CUTANEOUS | Status: DC

## 2021-07-09 MED ORDER — EPHEDRINE 5 MG/ML INJ
INTRAVENOUS | Status: AC
  Filled 2021-07-09: qty 5

## 2021-07-09 MED ORDER — GABAPENTIN 600 MG PO TABS
600.0000 mg | ORAL_TABLET | Freq: Every day | ORAL | Status: DC
  Administered 2021-07-09: 600 mg via ORAL
  Filled 2021-07-09: qty 1

## 2021-07-09 MED ORDER — PHENYLEPHRINE 40 MCG/ML (10ML) SYRINGE FOR IV PUSH (FOR BLOOD PRESSURE SUPPORT)
PREFILLED_SYRINGE | INTRAVENOUS | Status: AC
  Filled 2021-07-09: qty 20

## 2021-07-09 MED ORDER — SODIUM CHLORIDE 0.9 % IV SOLN: INTRAVENOUS | Status: DC

## 2021-07-09 MED ORDER — CHLORHEXIDINE GLUCONATE CLOTH 2 % EX PADS: 6.0000 | MEDICATED_PAD | Freq: Once | CUTANEOUS | Status: DC

## 2021-07-09 MED ORDER — ONDANSETRON HCL 4 MG/2ML IJ SOLN
INTRAMUSCULAR | Status: AC
  Filled 2021-07-09: qty 4

## 2021-07-09 MED ORDER — DEXAMETHASONE SODIUM PHOSPHATE 10 MG/ML IJ SOLN
INTRAMUSCULAR | Status: AC
  Filled 2021-07-09: qty 2

## 2021-07-09 MED ORDER — HYDROXYZINE HCL 50 MG/ML IM SOLN
50.0000 mg | Freq: Four times a day (QID) | INTRAMUSCULAR | Status: DC | PRN
Start: 2021-07-09 — End: 2021-07-10
  Administered 2021-07-09: 50 mg via INTRAMUSCULAR
  Filled 2021-07-09: qty 1

## 2021-07-09 MED ORDER — LIDOCAINE 5 % EX PTCH
1.0000 | MEDICATED_PATCH | CUTANEOUS | Status: DC
  Administered 2021-07-09: 1 via TRANSDERMAL
  Filled 2021-07-09 (×2): qty 1

## 2021-07-09 MED ORDER — ROPINIROLE HCL 0.5 MG PO TABS
0.5000 mg | ORAL_TABLET | Freq: Every day | ORAL | Status: DC
  Administered 2021-07-09: 0.5 mg via ORAL
  Filled 2021-07-09 (×2): qty 1

## 2021-07-09 MED ORDER — TIZANIDINE HCL 4 MG PO TABS
4.0000 mg | ORAL_TABLET | Freq: Four times a day (QID) | ORAL | Status: DC | PRN
  Administered 2021-07-09: 4 mg via ORAL
  Filled 2021-07-09: qty 1

## 2021-07-09 MED ORDER — MEPERIDINE HCL 25 MG/ML IJ SOLN: 6.2500 mg | INTRAMUSCULAR | Status: DC | PRN

## 2021-07-09 MED ORDER — BUPIVACAINE HCL (PF) 0.5 % IJ SOLN
INTRAMUSCULAR | Status: DC | PRN
Start: 2021-07-09 — End: 2021-07-09
  Administered 2021-07-09: 5 mL

## 2021-07-09 MED ORDER — GABAPENTIN 300 MG PO CAPS: 300.0000 mg | ORAL_CAPSULE | Freq: Three times a day (TID) | ORAL | Status: DC

## 2021-07-09 MED ORDER — ONDANSETRON HCL 4 MG PO TABS: 4.0000 mg | ORAL_TABLET | Freq: Four times a day (QID) | ORAL | Status: DC | PRN

## 2021-07-09 MED ORDER — VANCOMYCIN HCL IN DEXTROSE 1-5 GM/200ML-% IV SOLN
INTRAVENOUS | Status: AC
  Administered 2021-07-09: 1000 mg via INTRAVENOUS
  Filled 2021-07-09: qty 200

## 2021-07-09 MED ORDER — ALBUMIN HUMAN 5 % IV SOLN: INTRAVENOUS | Status: DC | PRN

## 2021-07-09 MED ORDER — BISACODYL 10 MG RE SUPP: 10.0000 mg | Freq: Every day | RECTAL | Status: DC | PRN

## 2021-07-09 MED ORDER — LIDOCAINE 2% (20 MG/ML) 5 ML SYRINGE
INTRAMUSCULAR | Status: AC
  Filled 2021-07-09: qty 10

## 2021-07-09 MED ORDER — GABAPENTIN 300 MG PO CAPS
300.0000 mg | ORAL_CAPSULE | Freq: Two times a day (BID) | ORAL | Status: DC
  Administered 2021-07-10: 300 mg via ORAL
  Filled 2021-07-09: qty 1

## 2021-07-09 MED ORDER — CEFAZOLIN SODIUM-DEXTROSE 2-4 GM/100ML-% IV SOLN
2.0000 g | Freq: Three times a day (TID) | INTRAVENOUS | Status: AC
  Administered 2021-07-09 – 2021-07-10 (×2): 2 g via INTRAVENOUS
  Filled 2021-07-09 (×2): qty 100

## 2021-07-09 MED ORDER — LIDOCAINE-EPINEPHRINE 1 %-1:100000 IJ SOLN
INTRAMUSCULAR | Status: AC
  Filled 2021-07-09: qty 1

## 2021-07-09 MED ORDER — SODIUM CHLORIDE 0.9 % IV SOLN: 250.0000 mL | INTRAVENOUS | Status: DC

## 2021-07-09 MED ORDER — LIDOCAINE 2% (20 MG/ML) 5 ML SYRINGE
INTRAMUSCULAR | Status: DC | PRN
Start: 2021-07-09 — End: 2021-07-09
  Administered 2021-07-09: 60 mg via INTRAVENOUS

## 2021-07-09 MED ORDER — ACETAMINOPHEN 650 MG RE SUPP
650.0000 mg | RECTAL | Status: DC | PRN
Start: 2021-07-09 — End: 2021-07-10

## 2021-07-09 MED ORDER — PANTOPRAZOLE SODIUM 40 MG IV SOLR
40.0000 mg | Freq: Every day | INTRAVENOUS | Status: DC
  Administered 2021-07-09: 40 mg via INTRAVENOUS
  Filled 2021-07-09: qty 10

## 2021-07-09 MED ORDER — LACTATED RINGERS IV SOLN: INTRAVENOUS | Status: DC

## 2021-07-09 MED ORDER — FENTANYL CITRATE (PF) 250 MCG/5ML IJ SOLN
INTRAMUSCULAR | Status: AC
  Filled 2021-07-09: qty 5

## 2021-07-09 MED ORDER — ESCITALOPRAM OXALATE 10 MG PO TABS
10.0000 mg | ORAL_TABLET | Freq: Every day | ORAL | Status: DC
  Filled 2021-07-09: qty 1

## 2021-07-09 MED ORDER — PROPOFOL 10 MG/ML IV BOLUS
INTRAVENOUS | Status: AC
  Filled 2021-07-09: qty 20

## 2021-07-09 MED ORDER — PROPOFOL 10 MG/ML IV BOLUS
INTRAVENOUS | Status: DC | PRN
Start: 2021-07-09 — End: 2021-07-09
  Administered 2021-07-09: 120 mg via INTRAVENOUS

## 2021-07-09 MED ORDER — PHENOL 1.4 % MT LIQD: 1.0000 | OROMUCOSAL | Status: DC | PRN

## 2021-07-09 MED ORDER — THROMBIN 5000 UNITS EX SOLR: CUTANEOUS | Status: DC | PRN

## 2021-07-09 MED ORDER — ACETAMINOPHEN 10 MG/ML IV SOLN
INTRAVENOUS | Status: AC
  Filled 2021-07-09: qty 100

## 2021-07-09 MED ORDER — LIDOCAINE-EPINEPHRINE 1 %-1:100000 IJ SOLN
INTRAMUSCULAR | Status: DC | PRN
Start: 2021-07-09 — End: 2021-07-09
  Administered 2021-07-09: 5 mL

## 2021-07-09 MED ORDER — SUGAMMADEX SODIUM 200 MG/2ML IV SOLN
INTRAVENOUS | Status: DC | PRN
  Administered 2021-07-09: 200 mg via INTRAVENOUS

## 2021-07-09 MED ORDER — PHENYLEPHRINE 40 MCG/ML (10ML) SYRINGE FOR IV PUSH (FOR BLOOD PRESSURE SUPPORT)
PREFILLED_SYRINGE | INTRAVENOUS | Status: DC | PRN
  Administered 2021-07-09: 40 ug via INTRAVENOUS
  Administered 2021-07-09: 80 ug via INTRAVENOUS
  Administered 2021-07-09 (×2): 40 ug via INTRAVENOUS
  Administered 2021-07-09: 80 ug via INTRAVENOUS
  Administered 2021-07-09 (×2): 40 ug via INTRAVENOUS
  Administered 2021-07-09: 120 ug via INTRAVENOUS

## 2021-07-09 MED ORDER — DOCUSATE SODIUM 100 MG PO CAPS
100.0000 mg | ORAL_CAPSULE | Freq: Two times a day (BID) | ORAL | Status: DC
  Administered 2021-07-09 – 2021-07-10 (×2): 100 mg via ORAL
  Filled 2021-07-09 (×2): qty 1

## 2021-07-09 MED ORDER — BUPIVACAINE HCL (PF) 0.5 % IJ SOLN
INTRAMUSCULAR | Status: AC
  Filled 2021-07-09: qty 30

## 2021-07-09 MED ORDER — HYDROMORPHONE HCL 1 MG/ML IJ SOLN
INTRAMUSCULAR | Status: AC
  Filled 2021-07-09: qty 1

## 2021-07-09 MED ORDER — SENNA 8.6 MG PO TABS
1.0000 | ORAL_TABLET | Freq: Two times a day (BID) | ORAL | Status: DC
  Administered 2021-07-09 – 2021-07-10 (×2): 8.6 mg via ORAL
  Filled 2021-07-09 (×2): qty 1

## 2021-07-09 MED ORDER — LEFLUNOMIDE 20 MG PO TABS
10.0000 mg | ORAL_TABLET | Freq: Every morning | ORAL | Status: DC
  Administered 2021-07-10: 10 mg via ORAL
  Filled 2021-07-09: qty 0.5

## 2021-07-09 MED ORDER — SODIUM CHLORIDE 0.9% FLUSH: 3.0000 mL | INTRAVENOUS | Status: DC | PRN

## 2021-07-09 MED ORDER — ONDANSETRON HCL 4 MG/2ML IJ SOLN
INTRAMUSCULAR | Status: DC | PRN
  Administered 2021-07-09: 4 mg via INTRAVENOUS

## 2021-07-09 MED ORDER — THROMBIN 5000 UNITS EX SOLR
CUTANEOUS | Status: AC
  Filled 2021-07-09: qty 5000

## 2021-07-09 MED ORDER — ACETAMINOPHEN 325 MG PO TABS: 650.0000 mg | ORAL_TABLET | ORAL | Status: DC | PRN

## 2021-07-09 MED ORDER — ACETAMINOPHEN 160 MG/5ML PO SOLN: 325.0000 mg | Freq: Once | ORAL | Status: DC | PRN

## 2021-07-09 MED ORDER — METHOCARBAMOL 1000 MG/10ML IJ SOLN
500.0000 mg | Freq: Four times a day (QID) | INTRAVENOUS | Status: DC | PRN
  Filled 2021-07-09: qty 5

## 2021-07-09 MED ORDER — ACETAMINOPHEN 10 MG/ML IV SOLN
1000.0000 mg | Freq: Once | INTRAVENOUS | Status: DC | PRN
  Administered 2021-07-09: 1000 mg via INTRAVENOUS

## 2021-07-09 MED ORDER — CEFAZOLIN SODIUM-DEXTROSE 2-3 GM-%(50ML) IV SOLR
INTRAVENOUS | Status: DC | PRN
  Administered 2021-07-09: 2 g via INTRAVENOUS

## 2021-07-09 MED ORDER — MENTHOL 3 MG MT LOZG: 1.0000 | LOZENGE | OROMUCOSAL | Status: DC | PRN

## 2021-07-09 MED ORDER — ORAL CARE MOUTH RINSE: 15.0000 mL | Freq: Once | OROMUCOSAL | Status: AC

## 2021-07-09 MED ORDER — EPHEDRINE SULFATE-NACL 50-0.9 MG/10ML-% IV SOSY
PREFILLED_SYRINGE | INTRAVENOUS | Status: DC | PRN
  Administered 2021-07-09: 2.5 mg via INTRAVENOUS

## 2021-07-09 MED ORDER — MORPHINE SULFATE (PF) 2 MG/ML IV SOLN
2.0000 mg | INTRAVENOUS | Status: DC | PRN
  Administered 2021-07-09: 2 mg via INTRAVENOUS
  Filled 2021-07-09: qty 1

## 2021-07-09 MED ORDER — HYDROCODONE-ACETAMINOPHEN 5-325 MG PO TABS
1.0000 | ORAL_TABLET | ORAL | Status: DC | PRN
  Administered 2021-07-10 (×3): 1 via ORAL
  Filled 2021-07-09 (×3): qty 1

## 2021-07-09 MED ORDER — CHLORHEXIDINE GLUCONATE 0.12 % MT SOLN
15.0000 mL | Freq: Once | OROMUCOSAL | Status: AC
  Administered 2021-07-09: 15 mL via OROMUCOSAL
  Filled 2021-07-09: qty 15

## 2021-07-09 MED ORDER — VANCOMYCIN HCL IN DEXTROSE 1-5 GM/200ML-% IV SOLN: 1000.0000 mg | INTRAVENOUS | Status: AC

## 2021-07-09 MED ORDER — DEXAMETHASONE SODIUM PHOSPHATE 10 MG/ML IJ SOLN
INTRAMUSCULAR | Status: DC | PRN
  Administered 2021-07-09: 10 mg via INTRAVENOUS

## 2021-07-09 MED ORDER — SODIUM CHLORIDE 0.9% FLUSH: 3.0000 mL | Freq: Two times a day (BID) | INTRAVENOUS | Status: DC

## 2021-07-09 MED ORDER — KETOROLAC TROMETHAMINE 30 MG/ML IJ SOLN
INTRAMUSCULAR | Status: AC
  Filled 2021-07-09: qty 1

## 2021-07-09 MED ORDER — METHOCARBAMOL 500 MG PO TABS
500.0000 mg | ORAL_TABLET | Freq: Four times a day (QID) | ORAL | Status: DC | PRN
  Administered 2021-07-09 – 2021-07-10 (×2): 500 mg via ORAL
  Filled 2021-07-09: qty 1

## 2021-07-09 MED ORDER — ROCURONIUM BROMIDE 10 MG/ML (PF) SYRINGE
PREFILLED_SYRINGE | INTRAVENOUS | Status: DC | PRN
  Administered 2021-07-09: 70 mg via INTRAVENOUS
  Administered 2021-07-09: 30 mg via INTRAVENOUS

## 2021-07-09 SURGICAL SUPPLY — 78 items
BAG COUNTER SPONGE SURGICOUNT (BAG) ×2 IMPLANT
BASKET BONE COLLECTION (BASKET) ×2 IMPLANT
BENZOIN TINCTURE PRP APPL 2/3 (GAUZE/BANDAGES/DRESSINGS) IMPLANT
BLADE CLIPPER SURG (BLADE) IMPLANT
BLADE SURG 11 STRL SS (BLADE) ×2 IMPLANT
BUR MATCHSTICK NEURO 3.0 LAGG (BURR) ×2 IMPLANT
BUR PRECISION FLUTE 5.0 (BURR) ×2 IMPLANT
CAGE INTERBODY PL SHT 7X22.5 (Plate) ×2 IMPLANT
CANISTER SUCT 3000ML PPV (MISCELLANEOUS) ×2 IMPLANT
CARTRIDGE OIL MAESTRO DRILL (MISCELLANEOUS) ×1 IMPLANT
CNTNR URN SCR LID CUP LEK RST (MISCELLANEOUS) ×1 IMPLANT
CONT SPEC 4OZ STRL OR WHT (MISCELLANEOUS) ×2
COVER BACK TABLE 60X90IN (DRAPES) ×2 IMPLANT
DECANTER SPIKE VIAL GLASS SM (MISCELLANEOUS) ×1 IMPLANT
DERMABOND ADVANCED (GAUZE/BANDAGES/DRESSINGS) ×1
DERMABOND ADVANCED .7 DNX12 (GAUZE/BANDAGES/DRESSINGS) ×1 IMPLANT
DIFFUSER DRILL AIR PNEUMATIC (MISCELLANEOUS) ×2 IMPLANT
DRAPE C-ARM 42X72 X-RAY (DRAPES) ×2 IMPLANT
DRAPE C-ARMOR (DRAPES) ×2 IMPLANT
DRAPE LAPAROTOMY 100X72X124 (DRAPES) ×2 IMPLANT
DRAPE SURG 17X23 STRL (DRAPES) ×2 IMPLANT
DRSG OPSITE POSTOP 4X6 (GAUZE/BANDAGES/DRESSINGS) ×1 IMPLANT
DURAPREP 26ML APPLICATOR (WOUND CARE) ×2 IMPLANT
ELECT REM PT RETURN 9FT ADLT (ELECTROSURGICAL) ×2
ELECTRODE REM PT RTRN 9FT ADLT (ELECTROSURGICAL) ×1 IMPLANT
GAUZE 4X4 16PLY ~~LOC~~+RFID DBL (SPONGE) IMPLANT
GAUZE SPONGE 4X4 12PLY STRL (GAUZE/BANDAGES/DRESSINGS) IMPLANT
GLOVE EXAM NITRILE XL STR (GLOVE) IMPLANT
GLOVE SRG 8 PF TXTR STRL LF DI (GLOVE) IMPLANT
GLOVE SURG ENC MOIS LTX SZ7.5 (GLOVE) IMPLANT
GLOVE SURG LTX SZ7 (GLOVE) ×4 IMPLANT
GLOVE SURG POLY ORTHO LF SZ8 (GLOVE) ×1 IMPLANT
GLOVE SURG POLYISO LF SZ7 (GLOVE) ×2 IMPLANT
GLOVE SURG POLYISO LF SZ7.5 (GLOVE) ×1 IMPLANT
GLOVE SURG POLYISO LF SZ8 (GLOVE) ×1 IMPLANT
GLOVE SURG UNDER POLY LF SZ7 (GLOVE) ×1 IMPLANT
GLOVE SURG UNDER POLY LF SZ7.5 (GLOVE) ×5 IMPLANT
GLOVE SURG UNDER POLY LF SZ8 (GLOVE) ×2
GOWN STRL REUS W/ TWL LRG LVL3 (GOWN DISPOSABLE) ×4 IMPLANT
GOWN STRL REUS W/ TWL XL LVL3 (GOWN DISPOSABLE) IMPLANT
GOWN STRL REUS W/TWL 2XL LVL3 (GOWN DISPOSABLE) IMPLANT
GOWN STRL REUS W/TWL LRG LVL3 (GOWN DISPOSABLE) ×8
GOWN STRL REUS W/TWL XL LVL3 (GOWN DISPOSABLE) ×2
GRAFT BONE PROTEIOS XS 0.5CC (Orthopedic Implant) ×1 IMPLANT
HEMOSTAT POWDER KIT SURGIFOAM (HEMOSTASIS) ×2 IMPLANT
KIT BASIN OR (CUSTOM PROCEDURE TRAY) ×2 IMPLANT
KIT POSITION SURG JACKSON T1 (MISCELLANEOUS) ×2 IMPLANT
KIT TURNOVER KIT B (KITS) ×2 IMPLANT
MARKER SKIN DUAL TIP RULER LAB (MISCELLANEOUS) ×1 IMPLANT
MILL MEDIUM DISP (BLADE) ×2 IMPLANT
NDL HYPO 18GX1.5 BLUNT FILL (NEEDLE) IMPLANT
NDL SPNL 18GX3.5 QUINCKE PK (NEEDLE) IMPLANT
NEEDLE HYPO 18GX1.5 BLUNT FILL (NEEDLE) IMPLANT
NEEDLE HYPO 22GX1.5 SAFETY (NEEDLE) ×2 IMPLANT
NEEDLE SPNL 18GX3.5 QUINCKE PK (NEEDLE) IMPLANT
NS IRRIG 1000ML POUR BTL (IV SOLUTION) ×2 IMPLANT
OIL CARTRIDGE MAESTRO DRILL (MISCELLANEOUS) ×2
PACK LAMINECTOMY NEURO (CUSTOM PROCEDURE TRAY) ×2 IMPLANT
PAD ARMBOARD 7.5X6 YLW CONV (MISCELLANEOUS) ×9 IMPLANT
PUTTY GRAFTON DBF 6CC W/DELIVE (Putty) ×1 IMPLANT
ROD COBALT 47.5X35 (Rod) ×2 IMPLANT
SCREW MAS 5.5X35 (Screw) ×2 IMPLANT
SCREW MAS 6.5X30 (Screw) ×2 IMPLANT
SCREW SET SOLERA (Screw) ×8 IMPLANT
SCREW SET SOLERA TI (Screw) IMPLANT
SEALANT ADHERUS EXTEND TIP (MISCELLANEOUS) ×1 IMPLANT
SPONGE SURGIFOAM ABS GEL 100 (HEMOSTASIS) IMPLANT
SPONGE T-LAP 4X18 ~~LOC~~+RFID (SPONGE) IMPLANT
STRIP CLOSURE SKIN 1/2X4 (GAUZE/BANDAGES/DRESSINGS) IMPLANT
SUT PROLENE 6 0 BV (SUTURE) ×1 IMPLANT
SUT VIC AB 0 CT1 18XCR BRD8 (SUTURE) ×1 IMPLANT
SUT VIC AB 0 CT1 8-18 (SUTURE) ×4
SUT VICRYL 3-0 RB1 18 ABS (SUTURE) ×3 IMPLANT
SYR 3ML LL SCALE MARK (SYRINGE) ×6 IMPLANT
TOWEL GREEN STERILE (TOWEL DISPOSABLE) ×2 IMPLANT
TOWEL GREEN STERILE FF (TOWEL DISPOSABLE) ×2 IMPLANT
TRAY FOLEY MTR SLVR 16FR STAT (SET/KITS/TRAYS/PACK) ×2 IMPLANT
WATER STERILE IRR 1000ML POUR (IV SOLUTION) ×2 IMPLANT

## 2021-07-09 NOTE — Progress Notes (Signed)
Orthopedic Tech Progress Note ?Patient Details:  ?Marilyn Perez ?1951/03/24 ?[id number] ? ?Ortho Devices ?Type of Ortho Device: Lumbar corsett ?Ortho Device/Splint Location: BACK ?Ortho Device/Splint Interventions: Ordered ?  ?Post Interventions ?Patient Tolerated: Well ?Instructions Provided: Care of device ? ?Janit Pagan ?07/09/2021, 5:26 PM ? ?

## 2021-07-09 NOTE — H&P (Signed)
?Chief Complaint  ? ?Left leg pain ? ?History of Present Illness  ?Marilyn Perez is a 71 y.o. female with a previous history of left-sided L4-5 laminotomy and microdiscectomy about 8 months ago.  She had good improvement in her left-sided leg pain after surgery unfortunately a few weeks ago she noted onset of severe recurrent left-sided leg pain.  She was initially taking gabapentin which provided some mild improvement.  Pain was severe enough that she required the use of a cane to help walk.  MRI was completed demonstrating a large recurrent disc herniation at L4-5.  She therefore elected to proceed with repeat surgical decompression and fusion. ? ?Past Medical History  ? ?Past Medical History:  ?Diagnosis Date  ? Anxiety   ? Arthritis   ? ra  ? Depression   ? History of kidney stones   ? passed stones  ? Hyperlipidemia 10/21/2014  ? diet controlled  ? Osteopenia   ? ? ?Past Surgical History  ? ?Past Surgical History:  ?Procedure Laterality Date  ? ABDOMINAL HYSTERECTOMY    ? partial and then total hysterectomy  ? ANTERIOR CERVICAL DECOMP/DISCECTOMY FUSION N/A 03/10/2014  ? Procedure: Cervical five-six Anterior Cervical Discectomy with  Fusion and Plating ;  Surgeon: Faythe Ghee, MD;  Location: [facility];  Service: Neurosurgery;  Laterality: N/A;  ? BACK SURGERY    ? BREAST EXCISIONAL BIOPSY    ? CARPAL TUNNEL RELEASE Bilateral   ? x 2  ? CESAREAN SECTION    ? x 3  ? COLONOSCOPY    ? EYE SURGERY Bilateral   ? cataracts  ? foot surgrey Bilateral   ? r/t RA - 2 left foot , 1 right foot surgery  ? ? ?Social History  ? ?Social History  ? ?Tobacco Use  ? Smoking status: Former  ?  Packs/day: 0.50  ?  Years: 6.00  ?  Pack years: 3.00  ?  Types: Cigarettes  ?  Quit date: 11/17/2020  ?  Years since quitting: 0.6  ? Smokeless tobacco: Never  ?Vaping Use  ? Vaping Use: Never used  ?Substance Use Topics  ? Alcohol use: No  ? Drug use: No  ? ? ?Medications  ? ?Prior to Admission medications   ?Medication Sig Start  Date End Date Taking? Authorizing Provider  ?certolizumab pegol (CIMZIA) 2 X 200 MG KIT Inject 400 mg into the skin every 30 (thirty) days.   Yes [provider]  ?escitalopram (LEXAPRO) 10 MG tablet Take 10 mg by mouth daily after lunch.   Yes [provider]  ?gabapentin (NEURONTIN) 300 MG capsule Take 300-600 mg by mouth 3 (three) times daily. Take 1 capsule (300 mg) by mouth in the morning & take 2 capsules (600 mg) by mouth at bedtime   Yes [provider]  ?HYDROcodone-acetaminophen (NORCO/VICODIN) 5-325 MG tablet Take 0.5-1 tablets by mouth daily as needed (pain.).   Yes [provider]  ?ibuprofen (ADVIL) 200 MG tablet Take 400 mg by mouth See admin instructions. Take 2 tablets (400 mg) by mouth in the morning & take 2 tablets (400 mg) by mouth in the afternoon. May take an additional dose 2 tablets (400 mg) by mouth in the evening if needed for pain.   Yes [provider]  ?leflunomide (ARAVA) 20 MG tablet Take 10 mg by mouth in the morning.   Yes [provider]  ?Melaton-LBalm-Cham-Lav-Brom (MIDNITE SLEEP AID) CHEW Chew 2 tablets by mouth at bedtime.   Yes  [provider]  ?rOPINIRole (REQUIP) 0.25 MG tablet Take 0.5 mg by mouth at bedtime.   Yes [provider]  ?SF 5000 PLUS 1.1 % CREA dental cream Place 1 application onto teeth at bedtime. 08/01/19  Yes [provider]  ?Lidocaine 4 % PTCH Apply 1 patch topically 2 (two) times daily. ?Patient not taking: Reported on 07/05/2021 06/05/21   Varney Biles, MD  ?predniSONE (DELTASONE) 10 MG tablet Take 5 tablets (50 mg total) by mouth daily. ?Patient not taking: Reported on 07/05/2021 06/06/21   Varney Biles, MD  ?tiZANidine (ZANAFLEX) 4 MG tablet Take 1 tablet (4 mg total) by mouth every 6 (six) hours as needed for muscle spasms. ?Patient not taking: Reported on 07/05/2021 06/05/21   Varney Biles, MD  ? ? ?Allergies  ? ?Allergies  ?Allergen Reactions  ? Oxycodone-Acetaminophen  Nausea And Vomiting  ? Penicillamine Itching and Swelling  ? Actemra [Tocilizumab] Rash  ? Codeine Rash  ? Penicillins Swelling and Rash  ?  "facial swelling"  ? Remicade [Infliximab] Swelling and Rash  ? Sulfa Antibiotics Rash  ? ? ?Review of Systems  ?ROS ? ?Neurologic Exam  ?Awake, alert, oriented ?Memory and concentration grossly intact ?Speech fluent, appropriate ?CN grossly intact ?Motor exam: ?Upper Extremities Deltoid Bicep Tricep Grip  ?Right 5/5 5/5 5/5 5/5  ?Left 5/5 5/5 5/5 5/5  ? ?Lower Extremities IP Quad PF DF EHL  ?Right 5/5 5/5 5/5 5/5 5/5  ?Left 5/5 5/5 5/5 5/5 5/5  ? ?Sensation grossly intact to LT ? ?Imaging  ?MRI of the lumbar spine dated 06/25/2021 was personally reviewed and demonstrates primary finding at L4-5 where there is previous left-sided laminotomy.  There is also a large recurrent disc herniation with superior migration to the level of the L3-4 disc space.  Also noted again is grade 1 anterolisthesis of L4 and L5. ? ?Impression  ?- 71 y.o. female with recurrent left-sided leg pain related to recurrent large L4-5 disc herniation in the setting of underlying degenerative spondylolisthesis. ? ?Plan  ?-We will plan on proceeding with surgical decompression and fusion at L4-5 ? ?I have reviewed the treatment options at length with the patient.  We have discussed the details of the surgery as well as the expected postoperative course and recovery.  We have also reviewed the associated risks, benefits, and alternatives to surgery.  All the patient's questions today were answered.  She provided informed consent to proceed. ? ? ?Consuella Lose, MD ?[facility]  ? ?

## 2021-07-09 NOTE — Anesthesia Procedure Notes (Signed)
Procedure Name: Intubation ?Date/Time: 07/09/2021 11:39 AM ?Performed by: Wilburn Cornelia, CRNA ?Pre-anesthesia Checklist: Patient identified, Emergency Drugs available, Suction available, Patient being monitored and Timeout performed ?Patient Re-evaluated:Patient Re-evaluated prior to induction ?Oxygen Delivery Method: Circle system utilized ?Preoxygenation: Pre-oxygenation with 100% oxygen ?Induction Type: IV induction ?Ventilation: Mask ventilation without difficulty ?Laryngoscope Size: Mac and 3 ?Grade View: Grade I ?Tube size: 7.0 mm ?Number of attempts: 1 ?Airway Equipment and Method: Stylet ?Placement Confirmation: ETT inserted through vocal cords under direct vision, positive ETCO2, CO2 detector and breath sounds checked- equal and bilateral ?Secured at: 21 cm ?Tube secured with: Tape ?Dental Injury: Teeth and Oropharynx as per pre-operative assessment  ? ? ? ? ?

## 2021-07-09 NOTE — Transfer of Care (Signed)
Immediate Anesthesia Transfer of Care Note ? ?Patient: Marilyn Perez ? ?Procedure(s) Performed: Posterior Lumbar Interbody Fusion Lumbar four -five (Spine Lumbar) ? ?Patient Location: PACU ? ?Anesthesia Type:General ? ?Level of Consciousness: drowsy and patient cooperative ? ?Airway & Oxygen Therapy: Patient Spontanous Breathing and Patient connected to nasal cannula oxygen ? ?Post-op Assessment: Report given to RN and Post -op Vital signs reviewed and stable ? ?Post vital signs: Reviewed and stable ? ?Last Vitals:  ?Vitals Value Taken Time  ?BP 118/90 07/09/21 1531  ?Temp    ?Pulse 103 07/09/21 1531  ?Resp 12 07/09/21 1531  ?SpO2 80 % 07/09/21 1531  ?Vitals shown include unvalidated device data. ? ?Last Pain:  ?Vitals:  ? 07/09/21 0830  ?TempSrc:   ?PainSc: 9   ?   ? ?Patients Stated Pain Goal: 3 (07/09/21 0830) ? ?Complications: No notable events documented. ?

## 2021-07-09 NOTE — Anesthesia Preprocedure Evaluation (Signed)
Anesthesia Evaluation  ?Patient identified by MRN, date of birth, ID band ?Patient awake ? ? ? ?Reviewed: ?Allergy & Precautions, NPO status , Patient's Chart, lab work & pertinent test results ? ?Airway ?Mallampati: I ? ?TM Distance: >3 FB ?Neck ROM: Full ? ? ? Dental ? ?(+) Teeth Intact, Dental Advisory Given ?  ?Pulmonary ?former smoker,  ?  ?breath sounds clear to auscultation ? ? ? ? ? ? Cardiovascular ?negative cardio ROS ? ? ?Rhythm:Regular Rate:Normal ? ? ?  ?Neuro/Psych ?PSYCHIATRIC DISORDERS Anxiety Depression   ? GI/Hepatic ?negative GI ROS, Neg liver ROS,   ?Endo/Other  ?negative endocrine ROS ? Renal/GU ?negative Renal ROS  ? ?  ?Musculoskeletal ? ?(+) Arthritis , Rheumatoid disorders,   ? Abdominal ?Normal abdominal exam  (+)   ?Peds ? Hematology ?negative hematology ROS ?(+)   ?Anesthesia Other Findings ? ? Reproductive/Obstetrics ? ?  ? ? ? ? ? ? ? ? ? ? ? ? ? ?  ?  ? ? ? ? ? ? ? ? ?Anesthesia Physical ?Anesthesia Plan ? ?ASA: 2 ? ?Anesthesia Plan: General  ? ?Post-op Pain Management:   ? ?Induction: Intravenous ? ?PONV Risk Score and Plan: 4 or greater and Ondansetron, Dexamethasone and Treatment may vary due to age or medical condition ? ?Airway Management Planned: Oral ETT ? ?Additional Equipment: None ? ?Intra-op Plan:  ? ?Post-operative Plan: Extubation in OR ? ?Informed Consent: I have reviewed the patients History and Physical, chart, labs and discussed the procedure including the risks, benefits and alternatives for the proposed anesthesia with the patient or authorized representative who has indicated his/her understanding and acceptance.  ? ? ? ?Dental advisory given ? ?Plan Discussed with: CRNA ? ?Anesthesia Plan Comments:   ? ? ? ? ? ? ?Anesthesia Quick Evaluation ? ?

## 2021-07-09 NOTE — Anesthesia Postprocedure Evaluation (Signed)
Anesthesia Post Note ? ?Patient: Marilyn Perez ? ?Procedure(s) Performed: Posterior Lumbar Interbody Fusion Lumbar four -five (Spine Lumbar) ? ?  ? ?Patient location during evaluation: PACU ?Anesthesia Type: General ?Level of consciousness: awake and alert ?Pain management: pain level controlled ?Vital Signs Assessment: post-procedure vital signs reviewed and stable ?Respiratory status: spontaneous breathing, nonlabored ventilation, respiratory function stable and patient connected to nasal cannula oxygen ?Cardiovascular status: blood pressure returned to baseline and stable ?Postop Assessment: no apparent nausea or vomiting ?Anesthetic complications: no ? ? ?No notable events documented. ? ?Last Vitals:  ?Vitals:  ? 07/09/21 1700 07/09/21 1724  ?BP: 131/71 (!) 151/81  ?Pulse: 91 85  ?Resp: 13 18  ?Temp:  36.7 ?C  ?SpO2: 95% 99%  ?  ?Last Pain:  ?Vitals:  ? 07/09/21 1724  ?TempSrc:   ?PainSc: 3   ? ? ?  ?  ?  ?  ?  ?  ? ?Effie Berkshire ? ? ? ? ?

## 2021-07-10 DIAGNOSIS — Z87891 Personal history of nicotine dependence: Secondary | ICD-10-CM | POA: Diagnosis not present

## 2021-07-10 DIAGNOSIS — M79605 Pain in left leg: Secondary | ICD-10-CM | POA: Diagnosis not present

## 2021-07-10 DIAGNOSIS — Z981 Arthrodesis status: Secondary | ICD-10-CM | POA: Diagnosis not present

## 2021-07-10 DIAGNOSIS — Z20822 Contact with and (suspected) exposure to covid-19: Secondary | ICD-10-CM | POA: Diagnosis not present

## 2021-07-10 DIAGNOSIS — M5126 Other intervertebral disc displacement, lumbar region: Secondary | ICD-10-CM | POA: Diagnosis not present

## 2021-07-10 DIAGNOSIS — M4316 Spondylolisthesis, lumbar region: Secondary | ICD-10-CM | POA: Diagnosis not present

## 2021-07-10 LAB — BASIC METABOLIC PANEL
Anion gap: 7 (ref 5–15)
BUN: 12 mg/dL (ref 8–23)
CO2: 27 mmol/L (ref 22–32)
Calcium: 8.6 mg/dL — ABNORMAL LOW (ref 8.9–10.3)
Chloride: 105 mmol/L (ref 98–111)
Creatinine, Ser: 0.8 mg/dL (ref 0.44–1.00)
GFR, Estimated: >60 mL/min (ref >60)
Glucose, Bld: 175 mg/dL — ABNORMAL HIGH (ref 70–99)
Potassium: 4.2 mmol/L (ref 3.5–5.1)
Sodium: 139 mmol/L (ref 135–145)

## 2021-07-10 LAB — CBC
HCT: 28.2 % — ABNORMAL LOW (ref 36.0–46.0)
Hemoglobin: 9.4 g/dL — ABNORMAL LOW (ref 12.0–15.0)
MCH: 30.9 pg (ref 26.0–34.0)
MCHC: 33.3 g/dL (ref 30.0–36.0)
MCV: 92.8 fL (ref 80.0–100.0)
Platelets: 149 10*3/uL — ABNORMAL LOW (ref 150–400)
RBC: 3.04 MIL/uL — ABNORMAL LOW (ref 3.87–5.11)
RDW: 13.8 % (ref 11.5–15.5)
WBC: 10.9 10*3/uL — ABNORMAL HIGH (ref 4.0–10.5)
nRBC: 0 % (ref 0.0–0.2)

## 2021-07-10 MED ORDER — METHOCARBAMOL 500 MG PO TABS
500.0000 mg | ORAL_TABLET | Freq: Three times a day (TID) | ORAL | 0 refills | Status: DC | PRN
Start: 2021-07-10 — End: 2022-02-15

## 2021-07-10 MED ORDER — HYDROCODONE-ACETAMINOPHEN 5-325 MG PO TABS: 1.0000 | ORAL_TABLET | Freq: Four times a day (QID) | ORAL | 0 refills | Status: AC | PRN

## 2021-07-10 NOTE — Discharge Summary (Signed)
?Physician Discharge Summary  ?Patient ID: ?Marilyn Perez ?MRN: [id number] ?DOB/AGE: 1950-08-20 71 y.o. ? ?Admit date: 07/09/2021 ?Discharge date: 07/10/2021 ? ?Admission Diagnoses:  ?Spondylolisthesis L4-5 ?Recurrent HNP, L4-5 ? ?Discharge Diagnoses:  ?Same ?Principal Problem: ?  Spondylolisthesis at L4-L5 level ? ? ?Discharged Condition: Stable ? ?Hospital Course:  ?Marilyn Perez is a 71 y.o. female admitted after L4-5 decompression/fusion. She was at baseline postop reporting complete relief of previous left leg pain. She was ambulating with a walker, tolerating diet, voiding normally, with pain controlled with oral medication. She therefore requested d/c home. ? ?Treatments: Surgery - L4-5 fusion ? ?Discharge Exam: ?Blood pressure 126/60, pulse 93, temperature 98.8 ?F (37.1 ?C), temperature source Oral, resp. rate 18, height 5' 5"  (1.651 m), weight 72.6 kg, SpO2 95 %. ?Awake, alert, oriented ?Speech fluent, appropriate ?CN grossly intact ?5/5 BUE/BLE ?Wound c/d/i ? ?Disposition: Discharge disposition: 01-Home or Self Care ? ? ? ? ? ? ?Discharge Instructions   ? ? Call MD for:  redness, tenderness, or signs of infection (pain, swelling, redness, odor or green/yellow discharge around incision site)   Complete by: As directed ?  ? Call MD for:  temperature >100.4   Complete by: As directed ?  ? Diet - low sodium heart healthy   Complete by: As directed ?  ? Discharge instructions   Complete by: As directed ?  ? Walk at home as much as possible, at least 4 times / day  ? Increase activity slowly   Complete by: As directed ?  ? Lifting restrictions   Complete by: As directed ?  ? No lifting > 10 lbs  ? May shower / Bathe   Complete by: As directed ?  ? 48 hours after surgery  ? May walk up steps   Complete by: As directed ?  ? Other Restrictions   Complete by: As directed ?  ? No bending/twisting at waist  ? Remove dressing in 24 hours   Complete by: As directed ?  ? ?  ? ?Allergies as of 07/10/2021   ? ?    Reactions  ? Oxycodone-acetaminophen Nausea And Vomiting  ? Penicillamine Itching, Swelling  ? Actemra [tocilizumab] Rash  ? Codeine Rash  ? Penicillins Swelling, Rash  ? "facial swelling"  ? Remicade [infliximab] Swelling, Rash  ? Sulfa Antibiotics Rash  ? ?  ? ?  ?Medication List  ?  ? ?STOP taking these medications   ? ?gabapentin 300 MG capsule ?Commonly known as: NEURONTIN ?  ?Lidocaine 4 % Ptch ?  ?predniSONE 10 MG tablet ?Commonly known as: DELTASONE ?  ?tiZANidine 4 MG tablet ?Commonly known as: Zanaflex ?  ? ?  ? ?TAKE these medications   ? ?Cimzia 2 X 200 MG Kit ?Generic drug: certolizumab pegol ?Inject 400 mg into the skin every 30 (thirty) days. ?  ?escitalopram 10 MG tablet ?Commonly known as: LEXAPRO ?Take 10 mg by mouth daily after lunch. ?  ?HYDROcodone-acetaminophen 5-325 MG tablet ?Commonly known as: NORCO/VICODIN ?Take 1-2 tablets by mouth every 6 (six) hours as needed for up to 7 days (pain.). ?What changed:  ?how much to take ?when to take this ?  ?ibuprofen 200 MG tablet ?Commonly known as: ADVIL ?Take 400 mg by mouth See admin instructions. Take 2 tablets (400 mg) by mouth in the morning & take 2 tablets (400 mg) by mouth in the afternoon. May take an additional dose 2 tablets (400 mg) by mouth in the evening if needed for pain. ?  ?  leflunomide 20 MG tablet ?Commonly known as: ARAVA ?Take 10 mg by mouth in the morning. ?  ?methocarbamol 500 MG tablet ?Commonly known as: ROBAXIN ?Take 1 tablet (500 mg total) by mouth every 8 (eight) hours as needed for muscle spasms. ?  ?MidNite Sleep Aid Chew ?Chew 2 tablets by mouth at bedtime. ?  ?rOPINIRole 0.25 MG tablet ?Commonly known as: REQUIP ?Take 0.5 mg by mouth at bedtime. ?  ?SF 5000 Plus 1.1 % Crea dental cream ?Generic drug: sodium fluoride ?Place 1 application onto teeth at bedtime. ?  ? ?  ? ? Follow-up Information   ? ? Consuella Lose, MD Follow up in 3 week(s).   ?Specialty: Neurosurgery ?Contact information: ?[address] ?[address] ?[place] [address] ?[phone] ? ? ?  ?  ? ?  ?  ? ?  ? ? ?Signed: ?Jairo Ben ?07/10/2021, 9:13 AM ? ? ?

## 2021-07-10 NOTE — Evaluation (Signed)
Physical Therapy Evaluation & Discharge ?Patient Details ?Name: Marilyn Perez ?MRN: [id number] ?DOB: February 19, 1951 ?Today's Date: 07/10/2021 ? ?History of Present Illness ? 71 y/o female admitted on 07/09/21 following PLIF L4-5. PMH: osteopenia, hx of ACDF 03/2014  ?Clinical Impression ? Patient admitted following above procedure. Patient functioning at modI for mobility with use of RW for stability. Able to negotiate flight of stairs to be able to access 2nd floor of her home. Educated patient on back precautions, brace wear, and progressive walking program, patient demonstrated understanding. No further skilled PT needs identified acutely. No PT follow up recommended at this time.    ?   ? ?Recommendations for follow up therapy are one component of a multi-disciplinary discharge planning process, led by the attending physician.  Recommendations may be updated based on patient status, additional functional criteria and insurance authorization. ? ?Follow Up Recommendations No PT follow up ? ?  ?Assistance Recommended at Discharge Intermittent Supervision/Assistance  ?Patient can return home with the following ?   ? ?  ?Equipment Recommendations None recommended by PT  ?Recommendations for Other Services ?    ?  ?Functional Status Assessment Patient has had a recent decline in their functional status and demonstrates the ability to make significant improvements in function in a reasonable and predictable amount of time.  ? ?  ?Precautions / Restrictions Precautions ?Precautions: Back ?Precaution Booklet Issued: Yes (comment) ?Precaution Comments: back precautions reviewed with pt - requires min cues ?Required Braces or Orthoses: Spinal Brace ?Spinal Brace: Applied in sitting position ?Restrictions ?Weight Bearing Restrictions: No  ? ?  ? ?Mobility ? Bed Mobility ?Overal bed mobility: Needs Assistance ?Bed Mobility: Rolling, Sidelying to Sit, Sit to Sidelying ?Rolling: Supervision ?Sidelying to sit: Supervision ?  ?   ?Sit to sidelying: Supervision ?  ?  ? ?Transfers ?Overall transfer level: Modified independent ?Equipment used: Rolling Keshayla Schrum (2 wheels) ?  ?  ?  ?  ?  ?  ?  ?  ?  ? ?Ambulation/Gait ?Ambulation/Gait assistance: Modified independent (Device/Increase time) ?Gait Distance (Feet): 300 Feet ?Assistive device: Rolling Vivianna Piccini (2 wheels) ?Gait Pattern/deviations: Step-through pattern, Decreased stride length ?Gait velocity: decreased ?  ?  ?General Gait Details: attempted without RW but patient with antalgic gait pattern and decreased stance time bilaterally putting her at risk for falls. Encouraged use of RW for stability at discharge ? ?Stairs ?Stairs: Yes ?Stairs assistance: Modified independent (Device/Increase time) ?Stair Management: One rail Left, Alternating pattern, Forwards ?Number of Stairs: 10 ?  ? ?Wheelchair Mobility ?  ? ?Modified Rankin (Stroke Patients Only) ?  ? ?  ? ?Balance Overall balance assessment: Mild deficits observed, not formally tested ?  ?  ?  ?  ?  ?  ?  ?  ?  ?  ?  ?  ?  ?  ?  ?  ?  ?  ?   ? ? ? ?Pertinent Vitals/Pain Pain Assessment ?Pain Assessment: 0-10 ?Pain Score: 8  ?Pain Location: back ?Pain Descriptors / Indicators: Aching, Operative site guarding ?Pain Intervention(s): Monitored during session  ? ? ?Home Living Family/patient expects to be discharged to:: Private residence ?Living Arrangements: Spouse/significant other ?Available Help at Discharge: Family ?Type of Home: House ?Home Access: Stairs to enter ?Entrance Stairs-Rails: Left ?Entrance Stairs-Number of Steps: 7 ?Alternate Level Stairs-Number of Steps: flight ?Home Layout: Two level;Bed/bath upstairs ?Home Equipment: Conservation officer, nature (2 wheels);Cane - single point;Crutches;Adaptive equipment ?   ?  ?Prior Function Prior Level of Function : Independent/Modified Independent ?  ?  ?  ?  ?  ?  ?  Mobility Comments: uses cane recently ?  ?  ? ? ?Hand Dominance  ? Dominant Hand: Right ? ?  ?Extremity/Trunk Assessment  ? Upper  Extremity Assessment ?Upper Extremity Assessment: Defer to OT evaluation ?  ? ?Lower Extremity Assessment ?Lower Extremity Assessment: Overall WFL for tasks assessed ?  ? ?Cervical / Trunk Assessment ?Cervical / Trunk Assessment: Back Surgery  ?Communication  ? Communication: No difficulties  ?Cognition Arousal/Alertness: Awake/alert ?Behavior During Therapy: [facility] for tasks assessed/performed ?Overall Cognitive Status: Within Functional Limits for tasks assessed ?  ?  ?  ?  ?  ?  ?  ?  ?  ?  ?  ?  ?  ?  ?  ?  ?  ?  ?  ? ?  ?General Comments   ? ?  ?Exercises    ? ?Assessment/Plan  ?  ?PT Assessment Patient does not need any further PT services  ?PT Problem List   ? ?   ?  ?PT Treatment Interventions     ? ?PT Goals (Current goals can be found in the Care Plan section)  ?Acute Rehab PT Goals ?Patient Stated Goal: to go home ?PT Goal Formulation: All assessment and education complete, DC therapy ? ?  ?Frequency   ?  ? ? ?Co-evaluation   ?  ?  ?  ?  ? ? ?  ?AM-PAC PT "6 Clicks" Mobility  ?Outcome Measure Help needed turning from your back to your side while in a flat bed without using bedrails?: A Little ?Help needed moving from lying on your back to sitting on the side of a flat bed without using bedrails?: A Little ?Help needed moving to and from a bed to a chair (including a wheelchair)?: None ?Help needed standing up from a chair using your arms (e.g., wheelchair or bedside chair)?: None ?Help needed to walk in hospital room?: None ?Help needed climbing 3-5 steps with a railing? : None ?6 Click Score: 22 ? ?  ?End of Session Equipment Utilized During Treatment: Back brace ?Activity Tolerance: Patient tolerated treatment well ?Patient left: in bed;with call bell/phone within reach ?Nurse Communication: Mobility status ?PT Visit Diagnosis: Unsteadiness on feet (R26.81);Muscle weakness (generalized) (M62.81) ?  ? ?Time: 2863-8177 ?PT Time Calculation (min) (ACUTE ONLY): 23 min ? ? ?Charges:   PT Evaluation ?$PT Eval  Low Complexity: 1 Low ?PT Treatments ?$Gait Training: 8-22 mins ?  ?   ? ? ?Mikaia Janvier A. Gilford Rile, PT, DPT ?Acute Rehabilitation Services ?Pager [phone] ?Office [phone] ? ? ?Praise Stennett A Seiya Silsby ?07/10/2021, 10:19 AM ? ?

## 2021-07-10 NOTE — Plan of Care (Signed)
Pt and husband given D/C instructions with verbal understanding. Rx's were sent to the pharmacy by MD. Pt's incision is clean and dry with no sign of infection. Pt's IV was removed prior to D/C. Pt D/C'd home via wheelchair per MD order. Pt is stable @ D/C and has no other needs at this time. Dajah Fischman, RN 

## 2021-07-10 NOTE — Evaluation (Signed)
Occupational Therapy Evaluation ?Patient Details ?Name: Marilyn Perez ?MRN: [id number] ?DOB: [birth year] ?Today's Date: 07/10/2021 ? ? ?History of Present Illness 71 y/o female admitted on 07/09/21 following PLIF L4-5. PMH: osteopenia, hx of ACDF 03/2014  ? ?Clinical Impression ?  ?Patient evaluated by Occupational Therapy with no further acute OT needs identified. All education has been completed and the patient has no further questions. All education completed.   See below for any follow-up Occupational Therapy or equipment needs. OT is signing off. Thank you for this referral. ?  ?   ? ?Recommendations for follow up therapy are one component of a multi-disciplinary discharge planning process, led by the attending physician.  Recommendations may be updated based on patient status, additional functional criteria and insurance authorization.  ? ?Follow Up Recommendations ? No OT follow up  ?  ?Assistance Recommended at Discharge Intermittent Supervision/Assistance  ?Patient can return home with the following A little help with bathing/dressing/bathroom;Assistance with cooking/housework;Assist for transportation;Help with stairs or ramp for entrance ? ?  ?Functional Status Assessment ? Patient has had a recent decline in their functional status and demonstrates the ability to make significant improvements in function in a reasonable and predictable amount of time.  ?Equipment Recommendations ? None recommended by OT  ?  ?Recommendations for Other Services   ? ? ?  ?Precautions / Restrictions Precautions ?Precautions: Back ?Precaution Booklet Issued: Yes (comment) ?Precaution Comments: back precautions reviewed with pt - requires min cues ?Required Braces or Orthoses: Spinal Brace ?Spinal Brace: Applied in sitting position  ? ?  ? ?Mobility Bed Mobility ?Overal bed mobility: Needs Assistance ?Bed Mobility: Rolling, Sidelying to Sit, Sit to Sidelying ?Rolling: Supervision ?Sidelying to sit: Supervision ?  ?  ?Sit to  sidelying: Supervision ?  ?  ? ?Transfers ?  ?  ?  ?  ?  ?  ?  ?  ?  ?  ?  ? ?  ?Balance Overall balance assessment: Mild deficits observed, not formally tested ?  ?  ?  ?  ?  ?  ?  ?  ?  ?  ?  ?  ?  ?  ?  ?  ?  ?  ?   ? ?ADL either performed or assessed with clinical judgement  ? ?ADL Overall ADL's : Needs assistance/impaired ?Eating/Feeding: Independent ?  ?Grooming: Wash/dry hands;Wash/dry face;Oral care;Brushing hair;Supervision/safety;Standing ?Grooming Details (indicate cue type and reason): min cues for precautions and technique ?Upper Body Bathing: Set up;Supervision/ safety;Sitting ?  ?Lower Body Bathing: Supervison/ safety;Sit to/from stand ?Lower Body Bathing Details (indicate cue type and reason): able to perform figure 4 ?Upper Body Dressing : Set up;Supervision/safety;Sitting ?  ?Lower Body Dressing: Supervision/safety;Sit to/from stand;Adhering to back precautions ?Lower Body Dressing Details (indicate cue type and reason): able to perform figure 4 ?Toilet Transfer: Min guard;Ambulation;Comfort height toilet;Rolling walker (2 wheels) ?  ?Toileting- Clothing Manipulation and Hygiene: Supervision/safety;Sit to/from stand ?  ?  ?Tub/Shower Transfer Details (indicate cue type and reason): pt has built in seat ?Functional mobility during ADLs: Supervision/safety;Rolling walker (2 wheels) ?General ADL Comments: pt able to don brace with supervision.  Family will assist with IADLs  ? ? ? ?Vision Patient Visual Report: No change from baseline ?   ?   ?Perception   ?  ?Praxis   ?  ? ?Pertinent Vitals/Pain Pain Assessment ?Pain Assessment: 0-10 ?Pain Score: 8  ?Pain Location: back ?Pain Descriptors / Indicators: Aching, Operative site guarding ?Pain Intervention(s): Monitored during session, Repositioned  ? ? ? ?  Hand Dominance Right ?  ?Extremity/Trunk Assessment Upper Extremity Assessment ?Upper Extremity Assessment: Overall WFL for tasks assessed ?  ?Lower Extremity Assessment ?Lower Extremity Assessment:  Defer to PT evaluation ?  ?Cervical / Trunk Assessment ?Cervical / Trunk Assessment: Back Surgery ?  ?Communication Communication ?Communication: No difficulties ?  ?Cognition Arousal/Alertness: Awake/alert ?Behavior During Therapy: [facility] for tasks assessed/performed ?Overall Cognitive Status: Within Functional Limits for tasks assessed ?  ?  ?  ?  ?  ?  ?  ?  ?  ?  ?  ?  ?  ?  ?  ?  ?  ?  ?  ?General Comments    ? ?  ?Exercises   ?  ?Shoulder Instructions    ? ? ?Home Living Family/patient expects to be discharged to:: Private residence ?Living Arrangements: Spouse/significant other ?Available Help at Discharge: Family ?Type of Home: House ?Home Access: Stairs to enter ?Entrance Stairs-Number of Steps: 7 ?Entrance Stairs-Rails: Left ?Home Layout: Two level;Bed/bath upstairs ?Alternate Level Stairs-Number of Steps: flight ?Alternate Level Stairs-Rails: Left ?Bathroom Shower/Tub: Walk-in shower ?  ?Bathroom Toilet: Handicapped height ?Bathroom Accessibility: Yes ?  ?Home Equipment: Conservation officer, nature (2 wheels);Cane - single point;Crutches;Adaptive equipment ?Adaptive Equipment: Reacher ?  ?  ? ?  ?Prior Functioning/Environment Prior Level of Function : Independent/Modified Independent ?  ?  ?  ?  ?  ?  ?Mobility Comments: uses cane recently ?  ?  ? ?  ?  ?OT Problem List: Decreased activity tolerance;Decreased knowledge of use of DME or AE;Decreased knowledge of precautions;Pain ?  ?   ?OT Treatment/Interventions:    ?  ?OT Goals(Current goals can be found in the care plan section) Acute Rehab OT Goals ?Patient Stated Goal: to have less pain ?OT Goal Formulation: All assessment and education complete, DC therapy  ?OT Frequency:   ?  ? ?Co-evaluation   ?  ?  ?  ?  ? ?  ?AM-PAC OT "6 Clicks" Daily Activity     ?Outcome Measure Help from another person eating meals?: None ?Help from another person taking care of personal grooming?: A Little ?Help from another person toileting, which includes using toliet, bedpan, or urinal?:  A Little ?Help from another person bathing (including washing, rinsing, drying)?: A Little ?Help from another person to put on and taking off regular upper body clothing?: A Little ?Help from another person to put on and taking off regular lower body clothing?: A Little ?6 Click Score: 19 ?  ?End of Session Equipment Utilized During Treatment: Rolling walker (2 wheels);Back brace ?Nurse Communication: Mobility status ? ?Activity Tolerance: Patient tolerated treatment well ?Patient left: in bed;with call bell/phone within reach;with family/visitor present ? ?OT Visit Diagnosis: Pain ?Pain - part of body:  (back)  ?              ?Time: 0354-6568 ?OT Time Calculation (min): 23 min ?Charges:  OT General Charges ?$OT Visit: 1 Visit ?OT Evaluation ?$OT Eval Moderate Complexity: 1 Mod ?OT Treatments ?$Self Care/Home Management : 8-22 mins ? ?Sire Poet C., OTR/L ?Acute Rehabilitation Services ?Pager [phone] ?Office [phone] ? ? ?Lugene Beougher M ?07/10/2021, 10:06 AM ?

## 2021-07-15 ENCOUNTER — Inpatient Hospital Stay (HOSPITAL_COMMUNITY): Admission: RE | Admit: 2021-07-15 | Payer: Medicare Other | Source: Ambulatory Visit

## 2021-07-16 NOTE — Op Note (Signed)
?NEUROSURGERY OPERATIVE NOTE  ? ?PREOP DIAGNOSIS:  ?1. Recurrent HNP, L4-5 ?2. Spondylolisthesis, L4-5 ? ?POSTOP DIAGNOSIS: Same ? ?PROCEDURE: ?1. L4 laminectomy with facetectomy for decompression of exiting nerve roots, more than would be required for placement of interbody graft ?2. Placement of anterior interbody device - Medtronic expandable 64m cages x2 ?3. Posterior non-segmental instrumentation using Medtronic Solera cortical pedicle screws at L4 - L5 ?4. Interbody arthrodesis, L4-5 ?5. Use of locally harvested bone autograft ?6. Use of non-structural bone allograft - ProteiOs ? ?SURGEON: Dr. NConsuella Lose MD ? ?ASSISTANT: Dr. TPieter PartridgeDawley ? ?ANESTHESIA: General Endotracheal ? ?EBL: 200cc ? ?SPECIMENS: None ? ?DRAINS: None ? ?COMPLICATIONS: None immediate ? ?CONDITION: Hemodynamically stable to PACU ? ?HISTORY: ?Marilyn LOSURDOis a 71y.o. female who has been followed in the outpatient clinic with a history of previous laminotomy and microdiscectomy several years ago.  She presented again with recurrence of left-sided leg pain.  Follow-up imaging revealed a large superiorly migrated disc fragment with development of spondylolisthesis at the previously operated level.  Patient therefore elected to proceed with surgical decompression and fusion.  The risks, benefits, and alternatives to surgery were all reviewed in detail with the patient.  After all questions were answered, informed consent was obtained and witnessed. ? ?PROCEDURE IN DETAIL: ?The patient was brought to the operating room via stretcher. After induction of general anesthesia, the patient was positioned on the operative table in the prone position. All pressure points were meticulously padded.  Previous incision was then marked out and prepped and draped in the usual sterile fashion. ? ?After timeout was conducted, skin was infiltrated with local anesthetic. Skin incision was then made sharply and Bovie electrocautery was used to  dissect the subcutaneous tissue until the lumbodorsal fascia was identified and incised. The muscle was then elevated in the subperiosteal plane and the spinous process and lamina at L4 were identified.  Subperiosteal dissection was carried out bilaterally.  The laminotomy defect on the left side was again identified.  Intraoperative lateral fluoroscopy was used to confirm our location at the correct level.  Self-retaining retractor was then placed. ? ?At this point attention was turned to decompression. Complete L4 laminectomy was completed with a high-speed drill and Kerrison punches.  Normal dura was identified.  A small durotomy was created at the L3-4 interspace.  This was covered with a small cottonoid and addressed after the decompression was completed.  A ball-tipped dissector was then used to identify the foramina bilaterally.  High-speed drill was used to cut across the pars interarticularis and the inferior articulating process of L4 was removed bilaterally.  Kerrison punches were then used to remove the medial aspect and the superior aspect of the superior articulating processes of L5.  This allowed complete decompression of both the traversing L5 root as well as the exiting L4 roots in the foramina bilaterally. ? ?Attention was then turned to the small durotomy at the L3-4 interspace.  This was easily closed primarily with 3 interrupted 6-0 Prolene stitches.  No further CSF egress was noted throughout the remainder of the case after the closure. ? ?Disc space was then identified, incised bilaterally, and using a combination of shavers, curettes and rongeurs, complete discectomy was completed.  Corresponding with the preoperative MRI scan, I did note a significant amount of subligamentous disc material on the left side extending to the level of the pedicle at L4.  This was removed with a blunt dissector.  Endplates were prepared with curettes and rasps,  and bone harvested during decompression was mixed  with ProteiOs and packed into the interspace. A 27m cage was tapped into place bilaterally. cages were expanded to achieve good endplate apposition.  Good position was confirmed with fluoroscopy. ? ?At this point, the entry points for bilateral L4 and L5 cortical pedicle screws were identified using standard anatomic landmarks and lateral fluoro. Pilot holes were then drilled and tapped to 5.58mat L4 and 6.84m16mt L5. Screws were then placed in L4 and L5 bilateraly. Prebent lordotic rod was then sized and placed into the pedicle screws. Set screws were placed and final tightened. Final AP and lateral fluoroscopic images confirmed good position. ? ?Hemostasis was secured and confirmed with bipolar cautery and morcellized gelfoam with thrombin. The wound was then irrigated with copious amounts of antibiotic saline. Dural closure was covered with a layer of PEG (Adherus) dural sealant. Wound was then closed in standard fashion using a combination of interrupted 0 and 3-0 Vicryl stitches in the muscular, fascial, and subcutaneous layers. Skin was then closed using standard Dermabond. Sterile dressing was then applied. The patient was then transferred to the stretcher, extubated, and taken to the postanesthesia care unit in stable hemodynamic condition. ? ?At the end of the case all sponge, needle, cottonoid, and instrument counts were correct. ? ? ?NeeConsuella LoseD ?[facility]  ? ?

## 2021-08-02 DIAGNOSIS — M0609 Rheumatoid arthritis without rheumatoid factor, multiple sites: Secondary | ICD-10-CM | POA: Diagnosis not present

## 2021-08-08 DIAGNOSIS — M4316 Spondylolisthesis, lumbar region: Secondary | ICD-10-CM | POA: Diagnosis not present

## 2021-08-29 DIAGNOSIS — F331 Major depressive disorder, recurrent, moderate: Secondary | ICD-10-CM | POA: Diagnosis not present

## 2021-08-29 DIAGNOSIS — Z1211 Encounter for screening for malignant neoplasm of colon: Secondary | ICD-10-CM | POA: Diagnosis not present

## 2021-08-29 DIAGNOSIS — Z0000 Encounter for general adult medical examination without abnormal findings: Secondary | ICD-10-CM | POA: Diagnosis not present

## 2021-08-29 DIAGNOSIS — Z5181 Encounter for therapeutic drug level monitoring: Secondary | ICD-10-CM | POA: Diagnosis not present

## 2021-08-29 DIAGNOSIS — Z1231 Encounter for screening mammogram for malignant neoplasm of breast: Secondary | ICD-10-CM | POA: Diagnosis not present

## 2021-08-29 DIAGNOSIS — H9190 Unspecified hearing loss, unspecified ear: Secondary | ICD-10-CM | POA: Diagnosis not present

## 2021-08-29 DIAGNOSIS — R030 Elevated blood-pressure reading, without diagnosis of hypertension: Secondary | ICD-10-CM | POA: Diagnosis not present

## 2021-08-29 DIAGNOSIS — E7800 Pure hypercholesterolemia, unspecified: Secondary | ICD-10-CM | POA: Diagnosis not present

## 2021-08-29 DIAGNOSIS — Z1389 Encounter for screening for other disorder: Secondary | ICD-10-CM | POA: Diagnosis not present

## 2021-08-29 DIAGNOSIS — M8580 Other specified disorders of bone density and structure, unspecified site: Secondary | ICD-10-CM | POA: Diagnosis not present

## 2021-08-30 ENCOUNTER — Other Ambulatory Visit: Payer: Self-pay | Admitting: Internal Medicine

## 2021-08-30 DIAGNOSIS — M0609 Rheumatoid arthritis without rheumatoid factor, multiple sites: Secondary | ICD-10-CM | POA: Diagnosis not present

## 2021-08-30 DIAGNOSIS — M8580 Other specified disorders of bone density and structure, unspecified site: Secondary | ICD-10-CM

## 2021-09-02 ENCOUNTER — Other Ambulatory Visit: Payer: Self-pay | Admitting: Internal Medicine

## 2021-09-02 DIAGNOSIS — Z1231 Encounter for screening mammogram for malignant neoplasm of breast: Secondary | ICD-10-CM

## 2021-09-13 DIAGNOSIS — E7800 Pure hypercholesterolemia, unspecified: Secondary | ICD-10-CM | POA: Diagnosis not present

## 2021-09-13 DIAGNOSIS — Z5181 Encounter for therapeutic drug level monitoring: Secondary | ICD-10-CM | POA: Diagnosis not present

## 2021-09-13 DIAGNOSIS — Z79899 Other long term (current) drug therapy: Secondary | ICD-10-CM | POA: Diagnosis not present

## 2021-09-27 DIAGNOSIS — M0609 Rheumatoid arthritis without rheumatoid factor, multiple sites: Secondary | ICD-10-CM | POA: Diagnosis not present

## 2021-10-28 DIAGNOSIS — M0609 Rheumatoid arthritis without rheumatoid factor, multiple sites: Secondary | ICD-10-CM | POA: Diagnosis not present

## 2021-10-28 DIAGNOSIS — Z79899 Other long term (current) drug therapy: Secondary | ICD-10-CM | POA: Diagnosis not present

## 2021-11-25 DIAGNOSIS — M0609 Rheumatoid arthritis without rheumatoid factor, multiple sites: Secondary | ICD-10-CM | POA: Diagnosis not present

## 2021-12-23 DIAGNOSIS — M0609 Rheumatoid arthritis without rheumatoid factor, multiple sites: Secondary | ICD-10-CM | POA: Diagnosis not present

## 2021-12-25 DIAGNOSIS — Z79899 Other long term (current) drug therapy: Secondary | ICD-10-CM | POA: Diagnosis not present

## 2021-12-25 DIAGNOSIS — M1991 Primary osteoarthritis, unspecified site: Secondary | ICD-10-CM | POA: Diagnosis not present

## 2021-12-25 DIAGNOSIS — E663 Overweight: Secondary | ICD-10-CM | POA: Diagnosis not present

## 2021-12-25 DIAGNOSIS — M0609 Rheumatoid arthritis without rheumatoid factor, multiple sites: Secondary | ICD-10-CM | POA: Diagnosis not present

## 2021-12-25 DIAGNOSIS — M353 Polymyalgia rheumatica: Secondary | ICD-10-CM | POA: Diagnosis not present

## 2021-12-25 DIAGNOSIS — Z6827 Body mass index (BMI) 27.0-27.9, adult: Secondary | ICD-10-CM | POA: Diagnosis not present

## 2022-01-09 DIAGNOSIS — Z6826 Body mass index (BMI) 26.0-26.9, adult: Secondary | ICD-10-CM | POA: Diagnosis not present

## 2022-01-09 DIAGNOSIS — M47812 Spondylosis without myelopathy or radiculopathy, cervical region: Secondary | ICD-10-CM | POA: Diagnosis not present

## 2022-01-09 DIAGNOSIS — M4312 Spondylolisthesis, cervical region: Secondary | ICD-10-CM | POA: Diagnosis not present

## 2022-01-10 ENCOUNTER — Other Ambulatory Visit: Payer: Self-pay | Admitting: Neurosurgery

## 2022-01-10 DIAGNOSIS — M47812 Spondylosis without myelopathy or radiculopathy, cervical region: Secondary | ICD-10-CM

## 2022-01-15 ENCOUNTER — Ambulatory Visit
Admission: RE | Admit: 2022-01-15 | Discharge: 2022-01-15 | Disposition: A | Discharge status: Home / Self Care | Payer: Medicare Other | Source: Ambulatory Visit | Attending: Neurosurgery | Admitting: Neurosurgery

## 2022-01-15 DIAGNOSIS — M47812 Spondylosis without myelopathy or radiculopathy, cervical region: Secondary | ICD-10-CM

## 2022-01-20 DIAGNOSIS — M0609 Rheumatoid arthritis without rheumatoid factor, multiple sites: Secondary | ICD-10-CM | POA: Diagnosis not present

## 2022-01-20 DIAGNOSIS — Z79899 Other long term (current) drug therapy: Secondary | ICD-10-CM | POA: Diagnosis not present

## 2022-01-22 DIAGNOSIS — M5020 Other cervical disc displacement, unspecified cervical region: Secondary | ICD-10-CM | POA: Diagnosis not present

## 2022-01-24 ENCOUNTER — Other Ambulatory Visit: Payer: Self-pay | Admitting: Neurosurgery

## 2022-02-04 ENCOUNTER — Other Ambulatory Visit: Payer: Self-pay | Admitting: Neurosurgery

## 2022-02-10 NOTE — Progress Notes (Signed)
Surgical Instructions    Your procedure is scheduled on 02/14/22.  Report to [facility] Main Entrance "A" at 5:30 A.M., then check in with the Admitting office.  Call this number if you have problems the morning of surgery:  [phone]   If you have any questions prior to your surgery date call [phone]: Open Monday-Friday 8am-4pm If you experience any cold or flu symptoms such as cough, fever, chills, shortness of breath, etc. between now and your scheduled surgery, please notify us at the above number     Remember:  Do not eat or drink after midnight the night before your surgery     Take these medicines the morning of surgery with A SIP OF WATER:  Arava Zoloft  As of today, STOP taking any Aspirin (unless otherwise instructed by your surgeon) Aleve, Naproxen, Ibuprofen, Motrin, Advil, Goody's, BC's, all herbal medications, fish oil, and all vitamins.           Do not wear jewelry or makeup. Do not wear lotions, powders, perfumes or deodorant. Do not shave 48 hours prior to surgery.  Do not bring valuables to the hospital. Do not wear nail polish, gel polish, artificial nails, or any other type of covering on natural nails (fingers and toes) If you have artificial nails or gel coating that need to be removed by a nail salon, please have this removed prior to surgery. Artificial nails or gel coating may interfere with anesthesia's ability to adequately monitor your vital signs.  [facility] is not responsible for any belongings or valuables.    Do NOT Smoke (Tobacco/Vaping)  24 hours prior to your procedure  If you use a CPAP at night, you may bring your mask for your overnight stay.   Contacts, glasses, hearing aids, dentures or partials may not be worn into surgery, please bring cases for these belongings   For patients admitted to the hospital, discharge time will be determined by your treatment team.   Patients discharged the day of surgery will not be allowed to  drive home, and someone needs to stay with them for 24 hours.   SURGICAL WAITING ROOM VISITATION Patients having surgery or a procedure may have no more than 2 support people in the waiting area - these visitors may rotate.   Children under the age of 87 must have an adult with them who is not the patient. If the patient needs to stay at the hospital during part of their recovery, the visitor guidelines for inpatient rooms apply. Pre-op nurse will coordinate an appropriate time for 1 support person to accompany patient in pre-op.  This support person may not rotate.   Please refer to the [facility] website for the visitor guidelines for Inpatients (after your surgery is over and you are in a regular room).    Special instructions:    Oral Hygiene is also important to reduce your risk of infection.  Remember - BRUSH YOUR TEETH THE MORNING OF SURGERY WITH YOUR REGULAR TOOTHPASTE   [facility]- Preparing For Surgery  Before surgery, you can play an important role. Because skin is not sterile, your skin needs to be as free of germs as possible. You can reduce the number of germs on your skin by washing with CHG (chlorahexidine gluconate) Soap before surgery.  CHG is an antiseptic cleaner which kills germs and bonds with the skin to continue killing germs even after washing.     Please do not use if you have an allergy to  CHG or antibacterial soaps. If your skin becomes reddened/irritated stop using the CHG.  Do not shave (including legs and underarms) for at least 48 hours prior to first CHG shower. It is OK to shave your face.  Please follow these instructions carefully.     Shower the NIGHT BEFORE SURGERY and the MORNING OF SURGERY with CHG Soap.   If you chose to wash your hair, wash your hair first as usual with your normal shampoo. After you shampoo, rinse your hair and body thoroughly to remove the shampoo.  Then [employer] and genitals (private parts) with your normal soap and rinse  thoroughly to remove soap.  After that Use CHG Soap as you would any other liquid soap. You can apply CHG directly to the skin and wash gently with a scrungie or a clean washcloth.   Apply the CHG Soap to your body ONLY FROM THE NECK DOWN.  Do not use on open wounds or open sores. Avoid contact with your eyes, ears, mouth and genitals (private parts). Wash Face and genitals (private parts)  with your normal soap.   Wash thoroughly, paying special attention to the area where your surgery will be performed.  Thoroughly rinse your body with warm water from the neck down.  DO NOT shower/wash with your normal soap after using and rinsing off the CHG Soap.  Pat yourself dry with a CLEAN TOWEL.  Wear CLEAN PAJAMAS to bed the night before surgery  Place CLEAN SHEETS on your bed the night before your surgery  DO NOT SLEEP WITH PETS.   Day of Surgery: Take a shower with CHG soap. Wear Clean/Comfortable clothing the morning of surgery Do not apply any deodorants/lotions.   Remember to brush your teeth WITH YOUR REGULAR TOOTHPASTE.    If you received a COVID test during your pre-op visit, it is requested that you wear a mask when out in public, stay away from anyone that may not be feeling well, and notify your surgeon if you develop symptoms. If you have been in contact with anyone that has tested positive in the last 10 days, please notify your surgeon.    Please read over the following fact sheets that you were given.

## 2022-02-11 ENCOUNTER — Other Ambulatory Visit: Payer: Self-pay

## 2022-02-11 ENCOUNTER — Encounter (HOSPITAL_COMMUNITY): Payer: Self-pay

## 2022-02-11 ENCOUNTER — Encounter (HOSPITAL_COMMUNITY)
Admission: RE | Admit: 2022-02-11 | Discharge: 2022-02-11 | Disposition: A | Discharge status: Home / Self Care | Payer: Medicare Other | Source: Ambulatory Visit | Attending: Neurosurgery | Admitting: Neurosurgery

## 2022-02-11 VITALS — BP 162/86 | HR 72 | Temp 98.1°F | Resp 17 | Ht 66.0 in | Wt 166.4 lb

## 2022-02-11 DIAGNOSIS — Z981 Arthrodesis status: Secondary | ICD-10-CM | POA: Diagnosis not present

## 2022-02-11 DIAGNOSIS — Z885 Allergy status to narcotic agent status: Secondary | ICD-10-CM | POA: Diagnosis not present

## 2022-02-11 DIAGNOSIS — F32A Depression, unspecified: Secondary | ICD-10-CM | POA: Diagnosis present

## 2022-02-11 DIAGNOSIS — Z9841 Cataract extraction status, right eye: Secondary | ICD-10-CM | POA: Diagnosis not present

## 2022-02-11 DIAGNOSIS — M8580 Other specified disorders of bone density and structure, unspecified site: Secondary | ICD-10-CM | POA: Diagnosis present

## 2022-02-11 DIAGNOSIS — Z79899 Other long term (current) drug therapy: Secondary | ICD-10-CM | POA: Diagnosis not present

## 2022-02-11 DIAGNOSIS — Z882 Allergy status to sulfonamides status: Secondary | ICD-10-CM | POA: Diagnosis not present

## 2022-02-11 DIAGNOSIS — Z01812 Encounter for preprocedural laboratory examination: Secondary | ICD-10-CM | POA: Insufficient documentation

## 2022-02-11 DIAGNOSIS — Z90710 Acquired absence of both cervix and uterus: Secondary | ICD-10-CM | POA: Diagnosis not present

## 2022-02-11 DIAGNOSIS — Z9842 Cataract extraction status, left eye: Secondary | ICD-10-CM | POA: Diagnosis not present

## 2022-02-11 DIAGNOSIS — Z87891 Personal history of nicotine dependence: Secondary | ICD-10-CM | POA: Diagnosis not present

## 2022-02-11 DIAGNOSIS — Z01818 Encounter for other preprocedural examination: Secondary | ICD-10-CM

## 2022-02-11 DIAGNOSIS — Z87442 Personal history of urinary calculi: Secondary | ICD-10-CM | POA: Diagnosis not present

## 2022-02-11 DIAGNOSIS — Z888 Allergy status to other drugs, medicaments and biological substances status: Secondary | ICD-10-CM | POA: Diagnosis not present

## 2022-02-11 DIAGNOSIS — M50021 Cervical disc disorder at C4-C5 level with myelopathy: Secondary | ICD-10-CM | POA: Diagnosis present

## 2022-02-11 DIAGNOSIS — E785 Hyperlipidemia, unspecified: Secondary | ICD-10-CM | POA: Diagnosis present

## 2022-02-11 DIAGNOSIS — M4322 Fusion of spine, cervical region: Secondary | ICD-10-CM | POA: Diagnosis not present

## 2022-02-11 DIAGNOSIS — M4802 Spinal stenosis, cervical region: Secondary | ICD-10-CM | POA: Diagnosis not present

## 2022-02-11 DIAGNOSIS — Z880 Allergy status to penicillin: Secondary | ICD-10-CM | POA: Diagnosis not present

## 2022-02-11 DIAGNOSIS — F419 Anxiety disorder, unspecified: Secondary | ICD-10-CM | POA: Diagnosis present

## 2022-02-11 DIAGNOSIS — M069 Rheumatoid arthritis, unspecified: Secondary | ICD-10-CM | POA: Diagnosis present

## 2022-02-11 HISTORY — DX: Other specified postprocedural states: Z98.890

## 2022-02-11 HISTORY — DX: Other specified postprocedural states: R11.2

## 2022-02-11 LAB — CBC
HCT: 35.1 % — ABNORMAL LOW (ref 36.0–46.0)
Hemoglobin: 11.1 g/dL — ABNORMAL LOW (ref 12.0–15.0)
MCH: 29.6 pg (ref 26.0–34.0)
MCHC: 31.6 g/dL (ref 30.0–36.0)
MCV: 93.6 fL (ref 80.0–100.0)
Platelets: 144 10*3/uL — ABNORMAL LOW (ref 150–400)
RBC: 3.75 MIL/uL — ABNORMAL LOW (ref 3.87–5.11)
RDW: 14 % (ref 11.5–15.5)
WBC: 5 10*3/uL (ref 4.0–10.5)
nRBC: 0 % (ref 0.0–0.2)

## 2022-02-11 LAB — SURGICAL PCR SCREEN
MRSA, PCR: NEGATIVE
Staphylococcus aureus: NEGATIVE

## 2022-02-11 NOTE — Progress Notes (Signed)
PCP - Seward Carol Cardiologist - denies Rheumatologist: Dr. Berna Bue [facility] rheumatology)  PPM/ICD - denies   Chest x-ray - n/a EKG - denies Stress Test - >10 ECHO - denies Cardiac Cath - denies  Sleep Study - denies   Follow your surgeon's instructions on when to stop Aspirin.  If no instructions were given by your surgeon then you will need to call the office to get those instructions.     ERAS Protcol -no   COVID TEST- no   Anesthesia review: no  Patient denies shortness of breath, fever, cough and chest pain at PAT appointment   All instructions explained to the patient, with a verbal understanding of the material. Patient agrees to go over the instructions while at home for a better understanding. Patient also instructed to self quarantine after being tested for COVID-19. The opportunity to ask questions was provided.

## 2022-02-12 LAB — TYPE AND SCREEN
ABO/RH(D): A POS
Antibody Screen: NEGATIVE

## 2022-02-14 ENCOUNTER — Other Ambulatory Visit: Payer: Self-pay

## 2022-02-14 ENCOUNTER — Inpatient Hospital Stay (HOSPITAL_COMMUNITY)
Admission: RE | Disposition: A | Discharge status: Home / Self Care | Payer: Self-pay | Source: Home / Self Care | Attending: Neurosurgery

## 2022-02-14 ENCOUNTER — Inpatient Hospital Stay (HOSPITAL_COMMUNITY): Payer: Medicare Other | Admitting: Certified Registered Nurse Anesthetist

## 2022-02-14 ENCOUNTER — Inpatient Hospital Stay (HOSPITAL_COMMUNITY): Payer: Medicare Other

## 2022-02-14 ENCOUNTER — Encounter (HOSPITAL_COMMUNITY): Payer: Self-pay | Admitting: Neurosurgery

## 2022-02-14 ENCOUNTER — Inpatient Hospital Stay (HOSPITAL_COMMUNITY)
Admission: RE | Admit: 2022-02-14 | Discharge: 2022-02-15 | DRG: 472 | Disposition: A | Discharge status: Home / Self Care | Payer: Medicare Other | Attending: Neurosurgery | Admitting: Neurosurgery

## 2022-02-14 DIAGNOSIS — Z01812 Encounter for preprocedural laboratory examination: Secondary | ICD-10-CM | POA: Diagnosis not present

## 2022-02-14 DIAGNOSIS — Z885 Allergy status to narcotic agent status: Secondary | ICD-10-CM | POA: Diagnosis not present

## 2022-02-14 DIAGNOSIS — Z9841 Cataract extraction status, right eye: Secondary | ICD-10-CM

## 2022-02-14 DIAGNOSIS — Z981 Arthrodesis status: Secondary | ICD-10-CM

## 2022-02-14 DIAGNOSIS — M4802 Spinal stenosis, cervical region: Secondary | ICD-10-CM | POA: Diagnosis present

## 2022-02-14 DIAGNOSIS — M8580 Other specified disorders of bone density and structure, unspecified site: Secondary | ICD-10-CM | POA: Diagnosis present

## 2022-02-14 DIAGNOSIS — Z79899 Other long term (current) drug therapy: Secondary | ICD-10-CM

## 2022-02-14 DIAGNOSIS — Z880 Allergy status to penicillin: Secondary | ICD-10-CM | POA: Diagnosis not present

## 2022-02-14 DIAGNOSIS — F32A Depression, unspecified: Secondary | ICD-10-CM | POA: Diagnosis present

## 2022-02-14 DIAGNOSIS — Z87442 Personal history of urinary calculi: Secondary | ICD-10-CM | POA: Diagnosis not present

## 2022-02-14 DIAGNOSIS — M069 Rheumatoid arthritis, unspecified: Secondary | ICD-10-CM | POA: Diagnosis present

## 2022-02-14 DIAGNOSIS — E785 Hyperlipidemia, unspecified: Secondary | ICD-10-CM | POA: Diagnosis present

## 2022-02-14 DIAGNOSIS — M4322 Fusion of spine, cervical region: Secondary | ICD-10-CM | POA: Diagnosis not present

## 2022-02-14 DIAGNOSIS — F419 Anxiety disorder, unspecified: Secondary | ICD-10-CM | POA: Diagnosis present

## 2022-02-14 DIAGNOSIS — Z9842 Cataract extraction status, left eye: Secondary | ICD-10-CM | POA: Diagnosis not present

## 2022-02-14 DIAGNOSIS — Z888 Allergy status to other drugs, medicaments and biological substances status: Secondary | ICD-10-CM | POA: Diagnosis not present

## 2022-02-14 DIAGNOSIS — Z882 Allergy status to sulfonamides status: Secondary | ICD-10-CM

## 2022-02-14 DIAGNOSIS — Z90710 Acquired absence of both cervix and uterus: Secondary | ICD-10-CM | POA: Diagnosis not present

## 2022-02-14 DIAGNOSIS — Z87891 Personal history of nicotine dependence: Secondary | ICD-10-CM | POA: Diagnosis not present

## 2022-02-14 DIAGNOSIS — M50021 Cervical disc disorder at C4-C5 level with myelopathy: Secondary | ICD-10-CM | POA: Diagnosis present

## 2022-02-14 HISTORY — PX: ANTERIOR CERVICAL DECOMP/DISCECTOMY FUSION: SHX1161

## 2022-02-14 SURGERY — ANTERIOR CERVICAL DECOMPRESSION/DISCECTOMY FUSION 1 LEVEL/HARDWARE REMOVAL
Anesthesia: General

## 2022-02-14 MED ORDER — CHLORHEXIDINE GLUCONATE CLOTH 2 % EX PADS: 6.0000 | MEDICATED_PAD | Freq: Once | CUTANEOUS | Status: DC

## 2022-02-14 MED ORDER — FLEET ENEMA 7-19 GM/118ML RE ENEM: 1.0000 | ENEMA | Freq: Once | RECTAL | Status: DC | PRN

## 2022-02-14 MED ORDER — SERTRALINE HCL 50 MG PO TABS
50.0000 mg | ORAL_TABLET | Freq: Every day | ORAL | Status: DC
  Administered 2022-02-14: 50 mg via ORAL
  Filled 2022-02-14: qty 1

## 2022-02-14 MED ORDER — MIDNITE SLEEP AID PO CHEW: 2.0000 | CHEWABLE_TABLET | Freq: Every day | ORAL | Status: DC

## 2022-02-14 MED ORDER — SODIUM CHLORIDE 0.9 % IV SOLN: INTRAVENOUS | Status: DC

## 2022-02-14 MED ORDER — DOCUSATE SODIUM 100 MG PO CAPS
100.0000 mg | ORAL_CAPSULE | Freq: Two times a day (BID) | ORAL | Status: DC
  Administered 2022-02-14: 100 mg via ORAL
  Filled 2022-02-14 (×2): qty 1

## 2022-02-14 MED ORDER — CHLORHEXIDINE GLUCONATE 0.12 % MT SOLN
15.0000 mL | Freq: Once | OROMUCOSAL | Status: AC
  Administered 2022-02-14: 15 mL via OROMUCOSAL
  Filled 2022-02-14: qty 15

## 2022-02-14 MED ORDER — ACETAMINOPHEN 325 MG PO TABS: 650.0000 mg | ORAL_TABLET | ORAL | Status: DC | PRN

## 2022-02-14 MED ORDER — THROMBIN (RECOMBINANT) 5000 UNITS EX SOLR
CUTANEOUS | Status: DC | PRN
  Administered 2022-02-14: 10 mL via TOPICAL

## 2022-02-14 MED ORDER — ROCURONIUM BROMIDE 10 MG/ML (PF) SYRINGE
PREFILLED_SYRINGE | INTRAVENOUS | Status: DC | PRN
  Administered 2022-02-14: 60 mg via INTRAVENOUS
  Administered 2022-02-14: 40 mg via INTRAVENOUS

## 2022-02-14 MED ORDER — ACETAMINOPHEN 650 MG RE SUPP: 650.0000 mg | RECTAL | Status: DC | PRN

## 2022-02-14 MED ORDER — LIDOCAINE-EPINEPHRINE 1 %-1:100000 IJ SOLN
INTRAMUSCULAR | Status: DC | PRN
  Administered 2022-02-14: 4.5 mL

## 2022-02-14 MED ORDER — SENNA 8.6 MG PO TABS
1.0000 | ORAL_TABLET | Freq: Two times a day (BID) | ORAL | Status: DC
  Administered 2022-02-14: 8.6 mg via ORAL
  Filled 2022-02-14 (×2): qty 1

## 2022-02-14 MED ORDER — DEXAMETHASONE SODIUM PHOSPHATE 10 MG/ML IJ SOLN
INTRAMUSCULAR | Status: AC
  Filled 2022-02-14: qty 1

## 2022-02-14 MED ORDER — BISACODYL 10 MG RE SUPP: 10.0000 mg | Freq: Every day | RECTAL | Status: DC | PRN

## 2022-02-14 MED ORDER — FENTANYL CITRATE (PF) 250 MCG/5ML IJ SOLN
INTRAMUSCULAR | Status: AC
  Filled 2022-02-14: qty 5

## 2022-02-14 MED ORDER — SODIUM CHLORIDE 0.9 % IV SOLN: 250.0000 mL | INTRAVENOUS | Status: DC

## 2022-02-14 MED ORDER — SODIUM CHLORIDE 0.9% FLUSH
3.0000 mL | Freq: Two times a day (BID) | INTRAVENOUS | Status: DC
  Administered 2022-02-14: 3 mL via INTRAVENOUS

## 2022-02-14 MED ORDER — LIDOCAINE 2% (20 MG/ML) 5 ML SYRINGE
INTRAMUSCULAR | Status: AC
  Filled 2022-02-14: qty 5

## 2022-02-14 MED ORDER — POLYETHYLENE GLYCOL 3350 17 G PO PACK: 17.0000 g | PACK | Freq: Every day | ORAL | Status: DC | PRN

## 2022-02-14 MED ORDER — AMISULPRIDE (ANTIEMETIC) 5 MG/2ML IV SOLN: 10.0000 mg | Freq: Once | INTRAVENOUS | Status: DC | PRN

## 2022-02-14 MED ORDER — PHENYLEPHRINE HCL-NACL 20-0.9 MG/250ML-% IV SOLN
INTRAVENOUS | Status: DC | PRN
  Administered 2022-02-14: 15 ug/min via INTRAVENOUS

## 2022-02-14 MED ORDER — PANTOPRAZOLE SODIUM 40 MG IV SOLR
40.0000 mg | Freq: Every day | INTRAVENOUS | Status: DC
  Administered 2022-02-14: 40 mg via INTRAVENOUS
  Filled 2022-02-14: qty 10

## 2022-02-14 MED ORDER — THROMBIN 5000 UNITS EX SOLR
OROMUCOSAL | Status: DC | PRN
  Administered 2022-02-14: 5 mL via TOPICAL

## 2022-02-14 MED ORDER — BUPIVACAINE HCL 0.5 % IJ SOLN
INTRAMUSCULAR | Status: DC | PRN
  Administered 2022-02-14: 4.5 mL

## 2022-02-14 MED ORDER — MIDAZOLAM HCL 2 MG/2ML IJ SOLN
INTRAMUSCULAR | Status: AC
  Filled 2022-02-14: qty 2

## 2022-02-14 MED ORDER — ACETAMINOPHEN 10 MG/ML IV SOLN
INTRAVENOUS | Status: AC
  Filled 2022-02-14: qty 100

## 2022-02-14 MED ORDER — LEFLUNOMIDE 10 MG PO TABS
10.0000 mg | ORAL_TABLET | Freq: Every day | ORAL | Status: DC
  Administered 2022-02-14: 10 mg via ORAL
  Filled 2022-02-14: qty 0.5
  Filled 2022-02-14: qty 1

## 2022-02-14 MED ORDER — MELATONIN 3 MG PO TABS
3.0000 mg | ORAL_TABLET | Freq: Every day | ORAL | Status: DC
  Administered 2022-02-14: 3 mg via ORAL
  Filled 2022-02-14: qty 1

## 2022-02-14 MED ORDER — HYDROCODONE-ACETAMINOPHEN 5-325 MG PO TABS
2.0000 | ORAL_TABLET | ORAL | Status: DC | PRN
  Administered 2022-02-14 – 2022-02-15 (×5): 2 via ORAL
  Filled 2022-02-14 (×5): qty 2

## 2022-02-14 MED ORDER — ONDANSETRON HCL 4 MG PO TABS: 4.0000 mg | ORAL_TABLET | Freq: Four times a day (QID) | ORAL | Status: DC | PRN

## 2022-02-14 MED ORDER — THROMBIN 5000 UNITS EX SOLR
CUTANEOUS | Status: AC
  Filled 2022-02-14: qty 15000

## 2022-02-14 MED ORDER — 0.9 % SODIUM CHLORIDE (POUR BTL) OPTIME
TOPICAL | Status: DC | PRN
  Administered 2022-02-14: 1000 mL

## 2022-02-14 MED ORDER — HYDROCODONE-ACETAMINOPHEN 5-325 MG PO TABS
1.0000 | ORAL_TABLET | ORAL | Status: DC | PRN
  Filled 2022-02-14: qty 1

## 2022-02-14 MED ORDER — VANCOMYCIN HCL IN DEXTROSE 1-5 GM/200ML-% IV SOLN
1000.0000 mg | INTRAVENOUS | Status: AC
  Administered 2022-02-14: 1000 mg via INTRAVENOUS
  Filled 2022-02-14: qty 200

## 2022-02-14 MED ORDER — ORAL CARE MOUTH RINSE: 15.0000 mL | Freq: Once | OROMUCOSAL | Status: AC

## 2022-02-14 MED ORDER — MENTHOL 3 MG MT LOZG: 1.0000 | LOZENGE | OROMUCOSAL | Status: DC | PRN

## 2022-02-14 MED ORDER — PROPOFOL 10 MG/ML IV BOLUS
INTRAVENOUS | Status: DC | PRN
  Administered 2022-02-14: 100 mg via INTRAVENOUS

## 2022-02-14 MED ORDER — ONDANSETRON HCL 4 MG/2ML IJ SOLN: 4.0000 mg | Freq: Four times a day (QID) | INTRAMUSCULAR | Status: DC | PRN

## 2022-02-14 MED ORDER — DEXAMETHASONE SODIUM PHOSPHATE 10 MG/ML IJ SOLN
INTRAMUSCULAR | Status: DC | PRN
  Administered 2022-02-14: 10 mg via INTRAVENOUS

## 2022-02-14 MED ORDER — PROPOFOL 10 MG/ML IV BOLUS
INTRAVENOUS | Status: AC
  Filled 2022-02-14: qty 20

## 2022-02-14 MED ORDER — FENTANYL CITRATE (PF) 100 MCG/2ML IJ SOLN
25.0000 ug | INTRAMUSCULAR | Status: DC | PRN
  Administered 2022-02-14: 50 ug via INTRAVENOUS

## 2022-02-14 MED ORDER — FENTANYL CITRATE (PF) 250 MCG/5ML IJ SOLN
INTRAMUSCULAR | Status: DC | PRN
  Administered 2022-02-14: 50 ug via INTRAVENOUS
  Administered 2022-02-14: 100 ug via INTRAVENOUS

## 2022-02-14 MED ORDER — LACTATED RINGERS IV SOLN: INTRAVENOUS | Status: DC

## 2022-02-14 MED ORDER — METHOCARBAMOL 500 MG PO TABS
500.0000 mg | ORAL_TABLET | Freq: Four times a day (QID) | ORAL | Status: DC | PRN
  Administered 2022-02-14 – 2022-02-15 (×3): 500 mg via ORAL
  Filled 2022-02-14 (×3): qty 1

## 2022-02-14 MED ORDER — PHENOL 1.4 % MT LIQD: 1.0000 | OROMUCOSAL | Status: DC | PRN

## 2022-02-14 MED ORDER — MORPHINE SULFATE (PF) 2 MG/ML IV SOLN: 2.0000 mg | INTRAVENOUS | Status: DC | PRN

## 2022-02-14 MED ORDER — SUGAMMADEX SODIUM 200 MG/2ML IV SOLN
INTRAVENOUS | Status: DC | PRN
  Administered 2022-02-14: 300 mg via INTRAVENOUS

## 2022-02-14 MED ORDER — ONDANSETRON HCL 4 MG/2ML IJ SOLN
INTRAMUSCULAR | Status: DC | PRN
  Administered 2022-02-14: 4 mg via INTRAVENOUS

## 2022-02-14 MED ORDER — LIDOCAINE 2% (20 MG/ML) 5 ML SYRINGE
INTRAMUSCULAR | Status: DC | PRN
  Administered 2022-02-14: 60 mg via INTRAVENOUS

## 2022-02-14 MED ORDER — VANCOMYCIN HCL IN DEXTROSE 1-5 GM/200ML-% IV SOLN
1000.0000 mg | Freq: Once | INTRAVENOUS | Status: AC
  Administered 2022-02-14: 1000 mg via INTRAVENOUS
  Filled 2022-02-14: qty 200

## 2022-02-14 MED ORDER — MIDAZOLAM HCL 2 MG/2ML IJ SOLN
INTRAMUSCULAR | Status: DC | PRN
  Administered 2022-02-14: 2 mg via INTRAVENOUS

## 2022-02-14 MED ORDER — METHOCARBAMOL 1000 MG/10ML IJ SOLN
500.0000 mg | Freq: Four times a day (QID) | INTRAVENOUS | Status: DC | PRN
  Filled 2022-02-14: qty 5

## 2022-02-14 MED ORDER — ONDANSETRON HCL 4 MG/2ML IJ SOLN
INTRAMUSCULAR | Status: AC
  Filled 2022-02-14: qty 2

## 2022-02-14 MED ORDER — LIDOCAINE-EPINEPHRINE 1 %-1:100000 IJ SOLN
INTRAMUSCULAR | Status: AC
  Filled 2022-02-14: qty 1

## 2022-02-14 MED ORDER — SODIUM CHLORIDE 0.9% FLUSH: 3.0000 mL | INTRAVENOUS | Status: DC | PRN

## 2022-02-14 MED ORDER — FENTANYL CITRATE (PF) 100 MCG/2ML IJ SOLN
INTRAMUSCULAR | Status: AC
  Filled 2022-02-14: qty 2

## 2022-02-14 MED ORDER — ACETAMINOPHEN 10 MG/ML IV SOLN
INTRAVENOUS | Status: DC | PRN
  Administered 2022-02-14: 1000 mg via INTRAVENOUS

## 2022-02-14 MED ORDER — PROMETHAZINE HCL 25 MG/ML IJ SOLN: 6.2500 mg | INTRAMUSCULAR | Status: DC | PRN

## 2022-02-14 MED ORDER — ROPINIROLE HCL 1 MG PO TABS
0.5000 mg | ORAL_TABLET | Freq: Every day | ORAL | Status: DC
  Administered 2022-02-14: 0.5 mg via ORAL
  Filled 2022-02-14: qty 1

## 2022-02-14 MED ORDER — BUPIVACAINE HCL (PF) 0.5 % IJ SOLN
INTRAMUSCULAR | Status: AC
  Filled 2022-02-14: qty 30

## 2022-02-14 SURGICAL SUPPLY — 58 items
BAG COUNTER SPONGE SURGICOUNT (BAG) ×1 IMPLANT
BAND RUBBER #18 3X1/16 STRL (MISCELLANEOUS) ×2 IMPLANT
BENZOIN TINCTURE PRP APPL 2/3 (GAUZE/BANDAGES/DRESSINGS) IMPLANT
BLADE CLIPPER SURG (BLADE) IMPLANT
BLADE SURG 11 STRL SS (BLADE) ×1 IMPLANT
BLADE ULTRA TIP 2M (BLADE) IMPLANT
BUR MATCHSTICK NEURO 3.0 LAGG (BURR) ×1 IMPLANT
CAGE LORDOTIC 6 SM (Cage) IMPLANT
CANISTER SUCT 3000ML PPV (MISCELLANEOUS) ×1 IMPLANT
DERMABOND ADVANCED .7 DNX12 (GAUZE/BANDAGES/DRESSINGS) ×1 IMPLANT
DERMABOND ADVANCED .7 DNX6 (GAUZE/BANDAGES/DRESSINGS) IMPLANT
DRAIN CHANNEL 10M FLAT 3/4 FLT (DRAIN) IMPLANT
DRAPE C-ARM 42X72 X-RAY (DRAPES) ×2 IMPLANT
DRAPE HALF SHEET 40X57 (DRAPES) IMPLANT
DRAPE LAPAROTOMY 100X72 PEDS (DRAPES) ×1 IMPLANT
DRAPE MICROSCOPE SLANT 54X150 (MISCELLANEOUS) ×1 IMPLANT
DRSG OPSITE 4X5.5 SM (GAUZE/BANDAGES/DRESSINGS) ×2 IMPLANT
DRSG OPSITE POSTOP 4X6 (GAUZE/BANDAGES/DRESSINGS) IMPLANT
DURAPREP 6ML APPLICATOR 50/CS (WOUND CARE) ×1 IMPLANT
ELECT COATED BLADE 2.86 ST (ELECTRODE) ×1 IMPLANT
ELECT REM PT RETURN 9FT ADLT (ELECTROSURGICAL) ×1
ELECTRODE REM PT RTRN 9FT ADLT (ELECTROSURGICAL) ×1 IMPLANT
EVACUATOR SILICONE 100CC (DRAIN) IMPLANT
GAUZE 4X4 16PLY ~~LOC~~+RFID DBL (SPONGE) IMPLANT
GLOVE BIO SURGEON STRL SZ7.5 (GLOVE) IMPLANT
GLOVE BIO SURGEON STRL SZ8 (GLOVE) IMPLANT
GLOVE BIOGEL PI IND STRL 7.5 (GLOVE) ×2 IMPLANT
GLOVE ECLIPSE 7.0 STRL STRAW (GLOVE) ×1 IMPLANT
GLOVE EXAM NITRILE XL STR (GLOVE) IMPLANT
GLOVE INDICATOR 8.5 STRL (GLOVE) IMPLANT
GOWN STRL REUS W/ TWL LRG LVL3 (GOWN DISPOSABLE) ×2 IMPLANT
GOWN STRL REUS W/ TWL XL LVL3 (GOWN DISPOSABLE) IMPLANT
GOWN STRL REUS W/TWL 2XL LVL3 (GOWN DISPOSABLE) IMPLANT
GOWN STRL REUS W/TWL LRG LVL3 (GOWN DISPOSABLE) ×5
GOWN STRL REUS W/TWL XL LVL3 (GOWN DISPOSABLE)
HEMOSTAT POWDER KIT SURGIFOAM (HEMOSTASIS) ×1 IMPLANT
KIT BASIN OR (CUSTOM PROCEDURE TRAY) ×1 IMPLANT
KIT TURNOVER KIT B (KITS) ×1 IMPLANT
NDL SPNL 22GX3.5 QUINCKE BK (NEEDLE) ×1 IMPLANT
NEEDLE HYPO 22GX1.5 SAFETY (NEEDLE) ×1 IMPLANT
NEEDLE SPNL 22GX3.5 QUINCKE BK (NEEDLE) ×1 IMPLANT
NS IRRIG 1000ML POUR BTL (IV SOLUTION) ×1 IMPLANT
PACK LAMINECTOMY NEURO (CUSTOM PROCEDURE TRAY) ×1 IMPLANT
PAD ARMBOARD 7.5X6 YLW CONV (MISCELLANEOUS) ×3 IMPLANT
PLATE ANT CERV ATLANTIS 19 (Plate) IMPLANT
PUTTY DBX 1CC (Putty) ×1 IMPLANT
PUTTY DBX 1CC DEPUY (Putty) IMPLANT
SCREW 4.0X15MM (Screw) IMPLANT
SPIKE FLUID TRANSFER (MISCELLANEOUS) ×1 IMPLANT
SPONGE INTESTINAL PEANUT (DISPOSABLE) ×1 IMPLANT
SPONGE SURGIFOAM ABS GEL SZ50 (HEMOSTASIS) ×1 IMPLANT
STRIP CLOSURE SKIN 1/2X4 (GAUZE/BANDAGES/DRESSINGS) IMPLANT
SUT VIC AB 3-0 SH 8-18 (SUTURE) ×1 IMPLANT
SUT VICRYL 3-0 RB1 18 ABS (SUTURE) ×2 IMPLANT
TAPE CLOTH 3X10 TAN LF (GAUZE/BANDAGES/DRESSINGS) ×1 IMPLANT
TOWEL GREEN STERILE (TOWEL DISPOSABLE) ×1 IMPLANT
TOWEL GREEN STERILE FF (TOWEL DISPOSABLE) ×1 IMPLANT
WATER STERILE IRR 1000ML POUR (IV SOLUTION) ×1 IMPLANT

## 2022-02-14 NOTE — Anesthesia Postprocedure Evaluation (Signed)
Anesthesia Post Note  Patient: Marilyn Perez  Procedure(s) Performed: Anterior Cervical Decompression and Fusion  Cervical Four-Five with removal of hardware     Patient location during evaluation: PACU Anesthesia Type: General Level of consciousness: sedated Pain management: pain level controlled Vital Signs Assessment: post-procedure vital signs reviewed and stable Respiratory status: spontaneous breathing and respiratory function stable Cardiovascular status: stable Postop Assessment: no apparent nausea or vomiting Anesthetic complications: yes   Encounter Notable Events  Notable Event Outcome Phase Comment  Difficult to intubate - expected  Intraprocedure Filed from anesthesia note documentation.    Last Vitals:  Vitals:   02/14/22 1130 02/14/22 1227  BP: (!) 154/83 (!) 168/92  Pulse: 73 77  Resp: 12 18  Temp:  36.7 C  SpO2: 92% 95%    Last Pain:  Vitals:   02/14/22 1056  TempSrc:   PainSc: 3                  Pernella Ackerley DANIEL

## 2022-02-14 NOTE — Progress Notes (Signed)
Orthopedic Tech Progress Note Patient Details:  CHARLIE CHAR 1951/01/27 [id number] Aspen Cervical collar delivered  Patient ID: Earnest Rosier, female   DOB: 1950/09/15, 71 y.o.   MRN: [id number]  Chip Boer 02/14/2022, 11:41 AM

## 2022-02-14 NOTE — Anesthesia Preprocedure Evaluation (Addendum)
Anesthesia Evaluation  Patient identified by MRN, date of birth, ID band Patient awake    Reviewed: Allergy & Precautions, NPO status , Patient's Chart, lab work & pertinent test results  History of Anesthesia Complications (+) PONV and history of anesthetic complications  Airway Mallampati: I  TM Distance: >3 FB Neck ROM: Full    Dental  (+) Teeth Intact, Dental Advisory Given   Pulmonary former smoker,    breath sounds clear to auscultation       Cardiovascular negative cardio ROS   Rhythm:Regular Rate:Normal     Neuro/Psych PSYCHIATRIC DISORDERS Anxiety Depression    GI/Hepatic negative GI ROS, Neg liver ROS,   Endo/Other  negative endocrine ROS  Renal/GU negative Renal ROS     Musculoskeletal  (+) Arthritis , Rheumatoid disorders,    Abdominal Normal abdominal exam  (+)   Peds  Hematology negative hematology ROS (+)   Anesthesia Other Findings   Reproductive/Obstetrics                            Anesthesia Physical  Anesthesia Plan  ASA: 2  Anesthesia Plan: General   Post-op Pain Management: Ofirmev IV (intra-op)*   Induction: Intravenous  PONV Risk Score and Plan: 4 or greater and Ondansetron, Dexamethasone and Treatment may vary due to age or medical condition  Airway Management Planned: Oral ETT  Additional Equipment: None  Intra-op Plan:   Post-operative Plan: Extubation in OR  Informed Consent: I have reviewed the patients History and Physical, chart, labs and discussed the procedure including the risks, benefits and alternatives for the proposed anesthesia with the patient or authorized representative who has indicated his/her understanding and acceptance.     Dental advisory given  Plan Discussed with: CRNA and Anesthesiologist  Anesthesia Plan Comments:        Anesthesia Quick Evaluation

## 2022-02-14 NOTE — H&P (Signed)
Chief Complaint   Neck and left arm pain  History of Present Illness  Marilyn Perez is a 71 y.o. female who has been followed in the outpatient neurosurgery clinic over several years for both neck and back pain.  She does have a remote history of C5-6 C6-7 ACDF.  Over the last several months she has been complaining of worsening neck and primarily left-sided arm pain and paresthesias.  Her imaging did reveal stenosis at C4-5 with associated spinal cord compression.  Surgical decompression was therefore recommended.  Past Medical History   Past Medical History:  Diagnosis Date   Anxiety    Arthritis    ra   Depression    History of kidney stones    passed stones   Hyperlipidemia 10/21/2014   diet controlled   Osteopenia    PONV (postoperative nausea and vomiting)     Past Surgical History   Past Surgical History:  Procedure Laterality Date   ABDOMINAL HYSTERECTOMY     partial and then total hysterectomy   ANTERIOR CERVICAL DECOMP/DISCECTOMY FUSION N/A 03/10/2014   Procedure: Cervical five-six Anterior Cervical Discectomy with  Fusion and Plating ;  Surgeon: Faythe Ghee, MD;  Location: [facility];  Service: Neurosurgery;  Laterality: N/A;   APPENDECTOMY     BACK SURGERY     x2- since 2021   BREAST EXCISIONAL BIOPSY     CARPAL TUNNEL RELEASE Bilateral    x 2   CESAREAN SECTION     x 3   COLONOSCOPY     EYE SURGERY Bilateral    cataracts   foot surgrey Bilateral    r/t RA - 2 left foot , 1 right foot surgery    Social History   Social History   Tobacco Use   Smoking status: Former    Packs/day: 0.50    Years: 6.00    Total pack years: 3.00    Types: Cigarettes    Quit date: 11/17/2020    Years since quitting: 1.2   Smokeless tobacco: Never  Vaping Use   Vaping Use: Never used  Substance Use Topics   Alcohol use: No   Drug use: No    Medications   Prior to Admission medications   Medication Sig Start Date End Date Taking? Authorizing  Provider  certolizumab pegol (CIMZIA) 2 X 200 MG KIT Inject 400 mg into the skin every 30 (thirty) days.   Yes [provider]  HYDROcodone-acetaminophen (NORCO/VICODIN) 5-325 MG tablet Take 0.5-1 tablets by mouth daily as needed for pain. 10/28/21  Yes [provider]  leflunomide (ARAVA) 10 MG tablet Take 10 mg by mouth daily. 12/26/21  Yes [provider]  loperamide (IMODIUM A-D) 2 MG tablet Take 2 mg by mouth 4 (four) times daily as needed for diarrhea or loose stools.   Yes [provider]  Melaton-LBalm-Cham-Lav-Brom (MIDNITE SLEEP AID) CHEW Chew 2 tablets by mouth at bedtime.   Yes [provider]  rOPINIRole (REQUIP) 0.25 MG tablet Take 0.5 mg by mouth at bedtime.   Yes [provider]  sertraline (ZOLOFT) 50 MG tablet Take 50 mg by mouth daily. 01/27/22  Yes [provider]  SF 5000 PLUS 1.1 % CREA dental cream Place 1 application  onto teeth at bedtime. 08/01/19  Yes [provider]  methocarbamol (ROBAXIN) 500 MG tablet Take 1 tablet (500 mg total) by mouth every 8 (eight) hours as needed for muscle spasms. Patient not taking: Reported on 02/06/2022 07/10/21  Consuella Lose, MD    Allergies   Allergies  Allergen Reactions   Oxycodone-Acetaminophen Nausea And Vomiting   Penicillamine Itching and Swelling   Actemra [Tocilizumab] Rash   Codeine Rash   Penicillins Swelling and Rash    "facial swelling"   Remicade [Infliximab] Swelling and Rash   Sulfa Antibiotics Rash    Review of Systems  ROS  Neurologic Exam  Awake, alert, oriented Memory and concentration grossly intact Speech fluent, appropriate CN grossly intact Motor exam: Upper Extremities Deltoid Bicep Tricep Grip  Right 5/5 5/5 5/5 5/5  Left 5/5 5/5 5/5 5/5   Lower Extremities IP Quad PF DF EHL  Right 5/5 5/5 5/5 5/5 5/5  Left 5/5 5/5 5/5 5/5 5/5   Sensation grossly intact to LT  Imaging  MRI of the cervical spine dated 01/15/2022 was  personally reviewed.  This demonstrates susceptibility artifact from previous ACDF at C5-6 and C6-7.  Primary finding is at C4-5 where there is a relatively large central disc herniation with associated stenosis and spinal cord compression.  Impression  - 71 y.o. female with left-sided neck and arm pain and imaging revealing relatively large central disc herniation at C4-5 with spinal cord compression  Plan  -We will plan on proceeding with ACDF C4-5 possible removal of hardware  I have reviewed the details of the operation as well as the expected postoperative course and recovery with the patient at length in the office.  We have also discussed the associated risks, benefits, and alternatives to surgery.  All her questions today were answered and she provided informed consent to proceed  Consuella Lose, MD [facility]

## 2022-02-14 NOTE — Progress Notes (Signed)
Pharmacy Antibiotic Note  Marilyn Perez is a 71 y.o. female admitted on 02/14/2022 with cervical stenosis, underwent surgical decompression on 10/13. Pharmacy has been consulted for vancomycin dosing for surgical prophylaxis.  Plan: Vancomycin '1000mg'$  IV x1 to be given 12 hours post-op  Pharmacy will formally sign off consult but continue to monitor patient peripherally.  Height: '5\' 6"'$  (167.6 cm) Weight: 75.3 kg (166 lb) IBW/kg (Calculated) : 59.3  Temp (24hrs), Avg:98 F (36.7 C), Min:97.6 F (36.4 C), Max:98.4 F (36.9 C)  Recent Labs  Lab 02/11/22 1400  WBC 5.0    CrCl cannot be calculated (Patient's most recent lab result is older than the maximum 21 days allowed.).    Allergies  Allergen Reactions   Oxycodone-Acetaminophen Nausea And Vomiting   Penicillamine Itching and Swelling   Actemra [Tocilizumab] Rash   Codeine Rash   Penicillins Swelling and Rash    "facial swelling"   Remicade [Infliximab] Swelling and Rash   Sulfa Antibiotics Rash    Thank you for allowing pharmacy to be a part of this patient's care.  Dimple Nanas, PharmD, BCPS 02/14/2022 12:55 PM

## 2022-02-14 NOTE — Anesthesia Procedure Notes (Signed)
Procedure Name: Intubation Date/Time: 02/14/2022 8:35 AM  Performed by: Duane Boston, MDPre-anesthesia Checklist: Patient identified, Emergency Drugs available, Suction available and Patient being monitored Patient Re-evaluated:Patient Re-evaluated prior to induction Oxygen Delivery Method: Circle System Utilized Preoxygenation: Pre-oxygenation with 100% oxygen Induction Type: IV induction Ventilation: Mask ventilation without difficulty Laryngoscope Size: Glidescope Grade View: Grade I Tube type: Oral Tube size: 7.0 mm Number of attempts: 1 Airway Equipment and Method: Stylet and Oral airway Placement Confirmation: ETT inserted through vocal cords under direct vision, positive ETCO2 and breath sounds checked- equal and bilateral Secured at: 21 cm Tube secured with: Tape Dental Injury: Teeth and Oropharynx as per pre-operative assessment  Difficulty Due To: Difficult Airway- due to reduced neck mobility and Difficulty was anticipated Comments: Elective glidescope intubation due to limited neck mobility and prior ACDF surgeries.

## 2022-02-14 NOTE — Op Note (Signed)
NEUROSURGERY OPERATIVE NOTE   PREOP DIAGNOSIS: Cervical stenosis, C4-5  POSTOP DIAGNOSIS: Same  PROCEDURE: 1. Discectomy at C4-5 for decompression of spinal cord and exiting nerve roots  2. Placement of intervertebral biomechanical device Medtronic Titan 35m lordotic cage 3. Placement of anterior instrumentation consisting of interbody plate and screws - Medtronic Atlantis 110mplate, 1548mcrews 4. Removal of previous anterior plate and screws  5. Arthrodesis C4-5, anterior interbody technique  6. Use of morselized bone allograft  7. Use of intraoperative microscope  SURGEON: Dr. NeeConsuella LoseD  ASSISTANT: Dr. GarKary KosD  ANESTHESIA: General Endotracheal  EBL: 50cccc  SPECIMENS: None  DRAINS: None  COMPLICATIONS: None immediate  CONDITION: Hemodynamically stable to PACU  HISTORY: Marilyn Perez a [age]o. y.o. female who initially presented to the outpatient clinic with neck and arm pain. MRI demonstrated central herniation with spinal cord compression. Treatment options were discussed including my recommendation for surgical decompression. After all questions were answered, informed consent was obtained.  PROCEDURE IN DETAIL: The patient was brought to the operating room and transferred to the operative table. After induction of general anesthesia, the patient was positioned on the operative table in the supine position with all pressure points meticulously padded. The skin of the neck was then prepped and draped in the usual sterile fashion.  The 2 previous right sided transverse skin incisions were identified.  A new incision slightly superior to this was marked out.  After timeout was conducted, the skin was infiltrated with local anesthetic. Skin incision was then made sharply and Bovie electrocautery was used to dissect the subcutaneous tissue until the platysma was identified. The platysma was then divided and undermined. The sternocleidomastoid muscle  was then identified and, utilizing natural fascial planes in the neck, the prevertebral fascia was identified and the carotid sheath was retracted laterally.  The prevertebral fascia was identified and incised.  Dissection was then carried inferiorly to identify the superior aspect of the previously placed cervical plate.  Using blunt dissection, the soft tissue was dissected off the previous plate and the C4-B7-1sc space.  The Bovie was then used to fully expose the previous plate.  The previous screws were then removed and the plate was easily removed.  Table mounted retractors were then placed parallel to the C4-5 disc space.  Microscope was then draped sterilely and brought into the field and the remainder of the case was done under the microscope using microdissection technique.    The disc space was incised sharply and rongeurs were use to initially complete a discectomy. The high-speed drill was then used to complete discectomy until the posterior annulus was identified and removed and the posterior longitudinal ligament was identified.  Nerve hook was then used to elevate the posterior longitudinal ligament which was then removed piecemeal using Kerrison punches.  I did note a significant amount of thickening of the posterior annulus and posterior longitudinal ligament slightly eccentric to the left side which appeared to be significantly indenting the spinal cord and the proximal aspect of the left C5 nerve root.  This was carefully dissected away from the ventral dura and removed with Kerrison punches.  Once this was done, the dura appeared to take up a much more normal position along the disc space.  I then removed the uncovertebral joints bilaterally in order to fully decompress the exiting C5 nerve roots.  Having completed our decompression, attention was turned to placement of the intervertebral device. Trial spacers were used to select  a 6 mm lordotic graft. This graft was then filled with  morcellized allograft, and inserted flush with the anterior vertebral body.  The above 19 mm plate was then selected and placed across the interspace.  This was secured with 15 mm screws in C4 and C5.  Final lateral fluoroscopic image revealed good location of the implanted hardware and good cervical alignment.  At this point, after all counts were verified to be correct, meticulous hemostasis was secured using a combination of bipolar electrocautery and passive hemostatics. The platysma muscle was then closed using interrupted 3-0 Vicryl sutures, and the skin was closed with an interrupted 3-0 Vicry subcuticular stitch. Dermabond and sterile dressings were then applied and the drapes removed.  The patient tolerated the procedure well and was extubated in the room and taken to the postanesthesia care unit in stable condition.   Marilyn Lose, MD [facility]

## 2022-02-14 NOTE — Transfer of Care (Signed)
Immediate Anesthesia Transfer of Care Note  Patient: KAELYN INNOCENT  Procedure(s) Performed: Anterior Cervical Decompression and Fusion  Cervical Four-Five with removal of hardware  Patient Location: PACU  Anesthesia Type:General  Level of Consciousness: awake, alert  and oriented  Airway & Oxygen Therapy: Patient Spontanous Breathing  Post-op Assessment: Report given to RN and Post -op Vital signs reviewed and stable  Post vital signs: Reviewed and stable  Last Vitals:  Vitals Value Taken Time  BP 163/83 02/14/22 1035  Temp    Pulse 82 02/14/22 1040  Resp 10 02/14/22 1040  SpO2 95 % 02/14/22 1040  Vitals shown include unvalidated device data.  Last Pain:  Vitals:   02/14/22 0616  TempSrc:   PainSc: 0-No pain      Patients Stated Pain Goal: 3 (00/37/04 8889)  Complications:  Encounter Notable Events  Notable Event Outcome Phase Comment  Difficult to intubate - expected  Intraprocedure Filed from anesthesia note documentation.

## 2022-02-15 MED ORDER — METHOCARBAMOL 500 MG PO TABS: 500.0000 mg | ORAL_TABLET | Freq: Four times a day (QID) | ORAL | 0 refills | Status: AC | PRN

## 2022-02-15 MED ORDER — DOCUSATE SODIUM 100 MG PO CAPS: 100.0000 mg | ORAL_CAPSULE | Freq: Two times a day (BID) | ORAL | 0 refills | Status: DC

## 2022-02-15 MED ORDER — HYDROCODONE-ACETAMINOPHEN 5-325 MG PO TABS: 1.0000 | ORAL_TABLET | ORAL | 0 refills | Status: AC | PRN

## 2022-02-15 NOTE — Progress Notes (Signed)
PT Cancellation/Sign off Note  Patient Details Name: Marilyn Perez MRN: [id number] DOB: [birth year]   Cancelled Treatment:    Reason Eval/Treat Not Completed: PT screened, no needs identified, will sign off (OT completed full evaluation and reports no PT needs. PT also observed pt ambulating in hallways without difficulty and with no device.)   Melvern Banker 02/15/2022, 9:18 AM Lavonia Dana, PT   Acute Rehabilitation Services  Office [phone] 02/15/2022

## 2022-02-15 NOTE — Evaluation (Signed)
Occupational Therapy Evaluation Patient Details Name: Marilyn Perez MRN: [id number] DOB: March 07, 1951 Today's Date: 02/15/2022   History of Present Illness 71 yo F adm 10/13 for scheduled ACDF.  PHM includes arthritis, and prior ACDF.   Clinical Impression   Patient admitted for the diagnosis and procedure above.  Patient is doing very well, no assist with any aspect of mobility or ADL.  No real post op discomfort, she has had two prior ACDF's, and understands all precautions.  All questions answered, and no further needs in the acute setting.        Recommendations for follow up therapy are one component of a multi-disciplinary discharge planning process, led by the attending physician.  Recommendations may be updated based on patient status, additional functional criteria and insurance authorization.   Follow Up Recommendations  No OT follow up    Assistance Recommended at Discharge None  Patient can return home with the following Assist for transportation    Functional Status Assessment  Patient has not had a recent decline in their functional status  Equipment Recommendations  None recommended by OT    Recommendations for Other Services       Precautions / Restrictions Precautions Precautions: Cervical Precaution Booklet Issued: Yes (comment) Required Braces or Orthoses: Cervical Brace Cervical Brace: Hard collar;For comfort Restrictions Weight Bearing Restrictions: No      Mobility Bed Mobility Overal bed mobility: Independent                  Transfers Overall transfer level: Independent                        Balance Overall balance assessment: No apparent balance deficits (not formally assessed)                                         ADL either performed or assessed with clinical judgement   ADL Overall ADL's : Independent                                             Vision Patient Visual  Report: No change from baseline       Perception Perception Perception: Not tested   Praxis Praxis Praxis: Not tested    Pertinent Vitals/Pain Pain Assessment Pain Assessment: No/denies pain     Hand Dominance Right   Extremity/Trunk Assessment Upper Extremity Assessment Upper Extremity Assessment: Overall WFL for tasks assessed   Lower Extremity Assessment Lower Extremity Assessment: Overall WFL for tasks assessed   Cervical / Trunk Assessment Cervical / Trunk Assessment: Neck Surgery   Communication Communication Communication: No difficulties   Cognition                                             General Comments   VSS    Exercises     Shoulder Instructions      Home Living Family/patient expects to be discharged to:: Private residence Living Arrangements: Spouse/significant other Available Help at Discharge: Family;Available 24 hours/day Type of Home: House Home Access: Stairs to enter [employer] of Steps: 7 Entrance Stairs-Rails: Left Home Layout: Two level;Bed/bath upstairs Alternate  Level Stairs-Number of Steps: flight Alternate Level Stairs-Rails: Left Bathroom Shower/Tub: Walk-in shower   Bathroom Toilet: Handicapped height Bathroom Accessibility: Yes   Home Equipment: Conservation officer, nature (2 wheels);Cane - single point;Crutches;Adaptive equipment Adaptive Equipment: Reacher        Prior Functioning/Environment Prior Level of Function : Independent/Modified Independent;Driving                        OT Problem List: Pain      OT Treatment/Interventions:      OT Goals(Current goals can be found in the care plan section) Acute Rehab OT Goals Patient Stated Goal: Return home OT Goal Formulation: With patient Time For Goal Achievement: 02/17/22 Potential to Achieve Goals: Good  OT Frequency:      Co-evaluation              AM-PAC OT "6 Clicks" Daily Activity     Outcome Measure Help from  another person eating meals?: None Help from another person taking care of personal grooming?: None Help from another person toileting, which includes using toliet, bedpan, or urinal?: None Help from another person bathing (including washing, rinsing, drying)?: None Help from another person to put on and taking off regular upper body clothing?: None Help from another person to put on and taking off regular lower body clothing?: None 6 Click Score: 24   End of Session Nurse Communication: Mobility status  Activity Tolerance: Patient tolerated treatment well Patient left: in chair;with call bell/phone within reach  OT Visit Diagnosis: Pain                Time: 2703-5009 OT Time Calculation (min): 21 min Charges:  OT General Charges $OT Visit: 1 Visit OT Evaluation $OT Eval Moderate Complexity: 1 Mod  02/15/2022  RP, OTR/L  Acute Rehabilitation Services  Office:  [phone]   Metta Clines 02/15/2022, 9:00 AM

## 2022-02-15 NOTE — Discharge Summary (Signed)
Physician Discharge Summary     Providing Compassionate, Quality Care - Together   Patient ID: Marilyn Perez MRN: [id number] DOB/AGE: 06-Aug-1950 71 y.o.  Admit date: 02/14/2022 Discharge date: 02/15/2022  Admission Diagnoses: Cervical stenosis of spine  Discharge Diagnoses:  Principal Problem:   Cervical stenosis of spine   Discharged Condition: good  Hospital Course: Patient underwent a C4-5 ACDF by Dr. Kathyrn Sheriff on 02/14/2022. She was admitted to 3C07 following recovery from anesthesia in the PACU. Her postoperative course has been uncomplicated. She has worked with both physical and occupational therapies who feel the patient is ready for discharge home. She is ambulating independently and without difficulty. She is tolerating a normal diet. She is not having any bowel or bladder dysfunction. Her pain is well-controlled with oral pain medication. She is ready for discharge home.   Consults: PT/OT  Significant Diagnostic Studies: radiology: DG Cervical Spine 1 View  Result Date: 02/14/2022 CLINICAL DATA:  Fluoroscopic assistance for cervical fusion at C4-C5 level EXAM: DG CERVICAL SPINE - 1 VIEW COMPARISON:  01/09/2022 FINDINGS: Fluoroscopic images show interval anterior surgical fusion at C4-C5 level. There is interval removal of surgical hardware at C5-C6 level. Intervertebral disc spaces are seen at C4-C5 and C5-C6 levels. Fluoroscopic time 1 seconds. Radiation dose 0.11 mGy. IMPRESSION: Fluoroscopic assistance was provided for anterior fusion at C4-C5 level. Electronically Signed   By: Elmer Picker M.D.   On: 02/14/2022 13:18   DG C-Arm 1-60 Min-No Report  Result Date: 02/14/2022 Fluoroscopy was utilized by the requesting physician.  No radiographic interpretation.     Treatments: surgery:  1. Discectomy at C4-5 for decompression of spinal cord and exiting nerve roots  2. Placement of intervertebral biomechanical device Medtronic Titan 20m lordotic cage 3.  Placement of anterior instrumentation consisting of interbody plate and screws - Medtronic Atlantis 132mplate, 1527mcrews 4. Removal of previous anterior plate and screws  5. Arthrodesis C4-5, anterior interbody technique  6. Use of morselized bone allograft  7. Use of intraoperative microscope  Discharge Exam: Blood pressure (!) 123/56, pulse 84, temperature 98.6 F (37 C), temperature source Oral, resp. rate 18, height 5' 6"  (1.676 m), weight 75.3 kg, SpO2 95 %.  Alert and oriented x 4 PERRLA CN II-XII grossly intact MAE, Strength and sensation intact Incision is covered with Honeycomb dressing and Steri Strips; Dressing is clean, dry, and intact   Disposition: Discharge disposition: 01-Home or Self Care       Discharge Instructions     Call MD for:  difficulty breathing, headache or visual disturbances   Complete by: As directed    Call MD for:  persistant nausea and vomiting   Complete by: As directed    Call MD for:  redness, tenderness, or signs of infection (pain, swelling, redness, odor or green/yellow discharge around incision site)   Complete by: As directed    Call MD for:  severe uncontrolled pain   Complete by: As directed    Call MD for:  temperature >100.4   Complete by: As directed    Diet - low sodium heart healthy   Complete by: As directed    Incentive spirometry RT   Complete by: As directed    Increase activity slowly   Complete by: As directed    Remove dressing in 48 hours   Complete by: As directed       Allergies as of 02/15/2022       Reactions   Oxycodone-acetaminophen Nausea And Vomiting  Penicillamine Itching, Swelling   Actemra [tocilizumab] Rash   Codeine Rash   Penicillins Swelling, Rash   "facial swelling"   Remicade [infliximab] Swelling, Rash   Sulfa Antibiotics Rash        Medication List     TAKE these medications    Cimzia 2 X 200 MG Kit Generic drug: certolizumab pegol Inject 400 mg into the skin every 30  (thirty) days.   docusate sodium 100 MG capsule Commonly known as: COLACE Take 1 capsule (100 mg total) by mouth 2 (two) times daily.   HYDROcodone-acetaminophen 5-325 MG tablet Commonly known as: NORCO/VICODIN Take 1-2 tablets by mouth every 4 (four) hours as needed for severe pain ((score 7 to 10)). What changed:  how much to take when to take this reasons to take this   leflunomide 10 MG tablet Commonly known as: ARAVA Take 10 mg by mouth daily.   loperamide 2 MG tablet Commonly known as: IMODIUM A-D Take 2 mg by mouth 4 (four) times daily as needed for diarrhea or loose stools.   methocarbamol 500 MG tablet Commonly known as: ROBAXIN Take 1 tablet (500 mg total) by mouth every 6 (six) hours as needed for muscle spasms. What changed: when to take this   [employer] [facility] 2 tablets by mouth at bedtime.   rOPINIRole 0.25 MG tablet Commonly known as: REQUIP Take 0.5 mg by mouth at bedtime.   sertraline 50 MG tablet Commonly known as: ZOLOFT Take 50 mg by mouth daily.   SF 5000 Plus 1.1 % Crea dental cream Generic drug: sodium fluoride Place 1 application  onto teeth at bedtime.        Follow-up Information     Consuella Lose, MD. Go on 03/10/2022.   Specialty: Neurosurgery Why: First post op appointment with x-rays is on 03/10/2022 at 10:30 AM. Contact information: [address] [place] [address] [phone]                 Signed: Viona Gilmore, DNP, AGNP-C Nurse Practitioner  [facility] [place] [address], [address], [place], [place] [address] P: [phone]    F: [phone]  02/15/2022, 9:03 AM

## 2022-02-15 NOTE — Discharge Instructions (Addendum)
Wound Care Keep incision covered and dry for three days.    Do not put any creams, lotions, or ointments on incision. Leave steri-strips on back.  They will fall off by themselves. You are fine to shower. Let water run over incision and pat dry.  Activity Walk each and every day, increasing distance each day. No lifting greater than 5 lbs.  Avoid excessive neck motion. No driving for 2 weeks; may ride as a passenger locally.  Diet Resume your normal diet.  Call Your Doctor If Any of These Occur Redness, drainage, or swelling at the wound.  Temperature greater than 101 degrees. Severe pain not relieved by pain medication. Incision starts to come apart.  Follow Up Appt Call [phone]  for any problems.  If you have any hardware placed in your spine, you will need an x-ray before your appointment.

## 2022-02-15 NOTE — Progress Notes (Signed)
Patient discharge home per order. Spouse and patient understanding of instructions given

## 2022-02-17 ENCOUNTER — Encounter (HOSPITAL_COMMUNITY): Payer: Self-pay | Admitting: Neurosurgery

## 2022-02-19 ENCOUNTER — Other Ambulatory Visit: Payer: Medicare Other

## 2022-02-19 ENCOUNTER — Ambulatory Visit: Payer: Medicare Other

## 2022-02-19 MED FILL — Thrombin For Soln 5000 Unit: CUTANEOUS | Qty: 2 | Status: AC

## 2022-03-05 DIAGNOSIS — Z23 Encounter for immunization: Secondary | ICD-10-CM | POA: Diagnosis not present

## 2022-03-05 DIAGNOSIS — D649 Anemia, unspecified: Secondary | ICD-10-CM | POA: Diagnosis not present

## 2022-03-05 DIAGNOSIS — R739 Hyperglycemia, unspecified: Secondary | ICD-10-CM | POA: Diagnosis not present

## 2022-03-05 DIAGNOSIS — R21 Rash and other nonspecific skin eruption: Secondary | ICD-10-CM | POA: Diagnosis not present

## 2022-03-10 DIAGNOSIS — M4312 Spondylolisthesis, cervical region: Secondary | ICD-10-CM | POA: Diagnosis not present

## 2022-03-10 DIAGNOSIS — Z6826 Body mass index (BMI) 26.0-26.9, adult: Secondary | ICD-10-CM | POA: Diagnosis not present

## 2022-03-10 DIAGNOSIS — M5020 Other cervical disc displacement, unspecified cervical region: Secondary | ICD-10-CM | POA: Diagnosis not present

## 2022-04-01 DIAGNOSIS — M0609 Rheumatoid arthritis without rheumatoid factor, multiple sites: Secondary | ICD-10-CM | POA: Diagnosis not present

## 2022-04-09 DIAGNOSIS — D649 Anemia, unspecified: Secondary | ICD-10-CM | POA: Diagnosis not present

## 2022-05-07 DIAGNOSIS — M0609 Rheumatoid arthritis without rheumatoid factor, multiple sites: Secondary | ICD-10-CM | POA: Diagnosis not present

## 2022-05-07 DIAGNOSIS — Z79899 Other long term (current) drug therapy: Secondary | ICD-10-CM | POA: Diagnosis not present

## 2022-06-04 DIAGNOSIS — R5383 Other fatigue: Secondary | ICD-10-CM | POA: Diagnosis not present

## 2022-06-04 DIAGNOSIS — Z79899 Other long term (current) drug therapy: Secondary | ICD-10-CM | POA: Diagnosis not present

## 2022-06-04 DIAGNOSIS — M0609 Rheumatoid arthritis without rheumatoid factor, multiple sites: Secondary | ICD-10-CM | POA: Diagnosis not present

## 2022-06-30 DIAGNOSIS — Z6827 Body mass index (BMI) 27.0-27.9, adult: Secondary | ICD-10-CM | POA: Diagnosis not present

## 2022-06-30 DIAGNOSIS — M0609 Rheumatoid arthritis without rheumatoid factor, multiple sites: Secondary | ICD-10-CM | POA: Diagnosis not present

## 2022-06-30 DIAGNOSIS — M1991 Primary osteoarthritis, unspecified site: Secondary | ICD-10-CM | POA: Diagnosis not present

## 2022-06-30 DIAGNOSIS — E663 Overweight: Secondary | ICD-10-CM | POA: Diagnosis not present

## 2022-06-30 DIAGNOSIS — M353 Polymyalgia rheumatica: Secondary | ICD-10-CM | POA: Diagnosis not present

## 2022-06-30 DIAGNOSIS — Z79899 Other long term (current) drug therapy: Secondary | ICD-10-CM | POA: Diagnosis not present

## 2022-07-02 DIAGNOSIS — M0609 Rheumatoid arthritis without rheumatoid factor, multiple sites: Secondary | ICD-10-CM | POA: Diagnosis not present

## 2022-07-30 DIAGNOSIS — M0609 Rheumatoid arthritis without rheumatoid factor, multiple sites: Secondary | ICD-10-CM | POA: Diagnosis not present

## 2022-08-27 DIAGNOSIS — M0609 Rheumatoid arthritis without rheumatoid factor, multiple sites: Secondary | ICD-10-CM | POA: Diagnosis not present

## 2022-09-04 DIAGNOSIS — L720 Epidermal cyst: Secondary | ICD-10-CM | POA: Diagnosis not present

## 2022-09-04 DIAGNOSIS — L603 Nail dystrophy: Secondary | ICD-10-CM | POA: Diagnosis not present

## 2022-09-04 DIAGNOSIS — L308 Other specified dermatitis: Secondary | ICD-10-CM | POA: Diagnosis not present

## 2022-09-08 DIAGNOSIS — M25522 Pain in left elbow: Secondary | ICD-10-CM | POA: Diagnosis not present

## 2022-09-08 DIAGNOSIS — S52502A Unspecified fracture of the lower end of left radius, initial encounter for closed fracture: Secondary | ICD-10-CM | POA: Diagnosis not present

## 2022-09-12 DIAGNOSIS — Y999 Unspecified external cause status: Secondary | ICD-10-CM | POA: Diagnosis not present

## 2022-09-12 DIAGNOSIS — X58XXXA Exposure to other specified factors, initial encounter: Secondary | ICD-10-CM | POA: Diagnosis not present

## 2022-09-12 DIAGNOSIS — S52572A Other intraarticular fracture of lower end of left radius, initial encounter for closed fracture: Secondary | ICD-10-CM | POA: Diagnosis not present

## 2022-09-12 DIAGNOSIS — G8918 Other acute postprocedural pain: Secondary | ICD-10-CM | POA: Diagnosis not present

## 2022-09-25 DIAGNOSIS — S52502A Unspecified fracture of the lower end of left radius, initial encounter for closed fracture: Secondary | ICD-10-CM | POA: Diagnosis not present

## 2022-10-02 DIAGNOSIS — M0609 Rheumatoid arthritis without rheumatoid factor, multiple sites: Secondary | ICD-10-CM | POA: Diagnosis not present

## 2022-10-03 DIAGNOSIS — M25532 Pain in left wrist: Secondary | ICD-10-CM | POA: Diagnosis not present

## 2022-10-10 DIAGNOSIS — M25532 Pain in left wrist: Secondary | ICD-10-CM | POA: Diagnosis not present

## 2022-10-14 DIAGNOSIS — M25532 Pain in left wrist: Secondary | ICD-10-CM | POA: Diagnosis not present

## 2022-10-17 DIAGNOSIS — M25532 Pain in left wrist: Secondary | ICD-10-CM | POA: Diagnosis not present

## 2022-10-21 DIAGNOSIS — M25532 Pain in left wrist: Secondary | ICD-10-CM | POA: Diagnosis not present

## 2022-10-23 DIAGNOSIS — S52502D Unspecified fracture of the lower end of left radius, subsequent encounter for closed fracture with routine healing: Secondary | ICD-10-CM | POA: Diagnosis not present

## 2022-10-24 DIAGNOSIS — M25532 Pain in left wrist: Secondary | ICD-10-CM | POA: Diagnosis not present

## 2022-10-28 DIAGNOSIS — M25532 Pain in left wrist: Secondary | ICD-10-CM | POA: Diagnosis not present

## 2022-10-29 DIAGNOSIS — L720 Epidermal cyst: Secondary | ICD-10-CM | POA: Diagnosis not present

## 2022-10-30 DIAGNOSIS — M0609 Rheumatoid arthritis without rheumatoid factor, multiple sites: Secondary | ICD-10-CM | POA: Diagnosis not present

## 2022-10-31 DIAGNOSIS — M25532 Pain in left wrist: Secondary | ICD-10-CM | POA: Diagnosis not present

## 2022-11-03 DIAGNOSIS — M25532 Pain in left wrist: Secondary | ICD-10-CM | POA: Diagnosis not present

## 2022-11-07 DIAGNOSIS — M25532 Pain in left wrist: Secondary | ICD-10-CM | POA: Diagnosis not present

## 2022-11-10 DIAGNOSIS — M25532 Pain in left wrist: Secondary | ICD-10-CM | POA: Diagnosis not present

## 2022-11-14 DIAGNOSIS — M25532 Pain in left wrist: Secondary | ICD-10-CM | POA: Diagnosis not present

## 2022-11-19 DIAGNOSIS — M25532 Pain in left wrist: Secondary | ICD-10-CM | POA: Diagnosis not present

## 2022-11-24 DIAGNOSIS — M25532 Pain in left wrist: Secondary | ICD-10-CM | POA: Diagnosis not present

## 2022-11-27 DIAGNOSIS — M0609 Rheumatoid arthritis without rheumatoid factor, multiple sites: Secondary | ICD-10-CM | POA: Diagnosis not present

## 2022-11-28 DIAGNOSIS — M25532 Pain in left wrist: Secondary | ICD-10-CM | POA: Diagnosis not present

## 2022-12-01 DIAGNOSIS — M25532 Pain in left wrist: Secondary | ICD-10-CM | POA: Diagnosis not present

## 2022-12-04 DIAGNOSIS — S52502D Unspecified fracture of the lower end of left radius, subsequent encounter for closed fracture with routine healing: Secondary | ICD-10-CM | POA: Diagnosis not present

## 2022-12-29 DIAGNOSIS — E663 Overweight: Secondary | ICD-10-CM | POA: Diagnosis not present

## 2022-12-29 DIAGNOSIS — M1991 Primary osteoarthritis, unspecified site: Secondary | ICD-10-CM | POA: Diagnosis not present

## 2022-12-29 DIAGNOSIS — M353 Polymyalgia rheumatica: Secondary | ICD-10-CM | POA: Diagnosis not present

## 2022-12-29 DIAGNOSIS — M0609 Rheumatoid arthritis without rheumatoid factor, multiple sites: Secondary | ICD-10-CM | POA: Diagnosis not present

## 2022-12-29 DIAGNOSIS — Z79899 Other long term (current) drug therapy: Secondary | ICD-10-CM | POA: Diagnosis not present

## 2022-12-29 DIAGNOSIS — Z6828 Body mass index (BMI) 28.0-28.9, adult: Secondary | ICD-10-CM | POA: Diagnosis not present

## 2023-01-01 DIAGNOSIS — M0609 Rheumatoid arthritis without rheumatoid factor, multiple sites: Secondary | ICD-10-CM | POA: Diagnosis not present

## 2023-01-29 DIAGNOSIS — M0609 Rheumatoid arthritis without rheumatoid factor, multiple sites: Secondary | ICD-10-CM | POA: Diagnosis not present

## 2023-02-26 DIAGNOSIS — D649 Anemia, unspecified: Secondary | ICD-10-CM | POA: Diagnosis not present

## 2023-02-26 DIAGNOSIS — M0609 Rheumatoid arthritis without rheumatoid factor, multiple sites: Secondary | ICD-10-CM | POA: Diagnosis not present

## 2023-03-26 DIAGNOSIS — M0609 Rheumatoid arthritis without rheumatoid factor, multiple sites: Secondary | ICD-10-CM | POA: Diagnosis not present

## 2023-03-30 DIAGNOSIS — Z79899 Other long term (current) drug therapy: Secondary | ICD-10-CM | POA: Diagnosis not present

## 2023-03-30 DIAGNOSIS — M1991 Primary osteoarthritis, unspecified site: Secondary | ICD-10-CM | POA: Diagnosis not present

## 2023-03-30 DIAGNOSIS — Z6828 Body mass index (BMI) 28.0-28.9, adult: Secondary | ICD-10-CM | POA: Diagnosis not present

## 2023-03-30 DIAGNOSIS — M0609 Rheumatoid arthritis without rheumatoid factor, multiple sites: Secondary | ICD-10-CM | POA: Diagnosis not present

## 2023-03-30 DIAGNOSIS — E663 Overweight: Secondary | ICD-10-CM | POA: Diagnosis not present

## 2023-03-30 DIAGNOSIS — M353 Polymyalgia rheumatica: Secondary | ICD-10-CM | POA: Diagnosis not present

## 2023-03-31 DIAGNOSIS — L301 Dyshidrosis [pompholyx]: Secondary | ICD-10-CM | POA: Diagnosis not present

## 2023-03-31 DIAGNOSIS — L403 Pustulosis palmaris et plantaris: Secondary | ICD-10-CM | POA: Diagnosis not present

## 2023-04-23 DIAGNOSIS — M0609 Rheumatoid arthritis without rheumatoid factor, multiple sites: Secondary | ICD-10-CM | POA: Diagnosis not present

## 2023-05-04 ENCOUNTER — Encounter: Payer: Self-pay | Admitting: Podiatry

## 2023-05-04 ENCOUNTER — Ambulatory Visit (INDEPENDENT_AMBULATORY_CARE_PROVIDER_SITE_OTHER): Payer: Medicare Other | Admitting: Podiatry

## 2023-05-04 ENCOUNTER — Ambulatory Visit (INDEPENDENT_AMBULATORY_CARE_PROVIDER_SITE_OTHER): Payer: Medicare Other

## 2023-05-04 DIAGNOSIS — M7752 Other enthesopathy of left foot: Secondary | ICD-10-CM

## 2023-05-04 DIAGNOSIS — M778 Other enthesopathies, not elsewhere classified: Secondary | ICD-10-CM

## 2023-05-04 DIAGNOSIS — R609 Edema, unspecified: Secondary | ICD-10-CM | POA: Diagnosis not present

## 2023-05-04 MED ORDER — METHYLPREDNISOLONE 4 MG PO TBPK: ORAL_TABLET | ORAL | 0 refills | Status: AC

## 2023-05-04 NOTE — Patient Instructions (Signed)

## 2023-05-05 NOTE — Progress Notes (Signed)
Subjective:   Patient ID: Marilyn Perez, female   DOB: 72 y.o.   MRN: [id number]   HPI Chief Complaint  Patient presents with   Foot Pain    RM#13 Left foot pain swelling around ankle area unable to walk when it swells up.    72 year old female presents the office today with concerns of left foot, ankle pain and swelling around the ankle.  She does not want as well as that is hard to walk.  She does not recall any recent injuries.  No redness that she notices when it swells.  No recent treatment since I saw her last.  She also states that she been getting swelling and pain on the right foot.  No injuries.  Is also been going on for some time.  No recent treatment for this.   Review of Systems  All other systems reviewed and are negative.  Past Medical History:  Diagnosis Date   Anxiety    Arthritis    ra   Depression    History of kidney stones    passed stones   Hyperlipidemia 10/21/2014   diet controlled   Osteopenia    PONV (postoperative nausea and vomiting)     Past Surgical History:  Procedure Laterality Date   ABDOMINAL HYSTERECTOMY     partial and then total hysterectomy   ANTERIOR CERVICAL DECOMP/DISCECTOMY FUSION N/A 03/10/2014   Procedure: Cervical five-six Anterior Cervical Discectomy with  Fusion and Plating ;  Surgeon: Reinaldo Meeker, MD;  Location: [facility];  Service: Neurosurgery;  Laterality: N/A;   ANTERIOR CERVICAL DECOMP/DISCECTOMY FUSION N/A 02/14/2022   Procedure: Anterior Cervical Decompression and Fusion  Cervical Four-Five with removal of hardware;  Surgeon: Lisbeth Renshaw, MD;  Location: [facility] OR;  Service: Neurosurgery;  Laterality: N/A;  3C   APPENDECTOMY     BACK SURGERY     x2- since 2021   BREAST EXCISIONAL BIOPSY     CARPAL TUNNEL RELEASE Bilateral    x 2   CESAREAN SECTION     x 3   COLONOSCOPY     EYE SURGERY Bilateral    cataracts   foot surgrey Bilateral    r/t RA - 2 left foot , 1 right foot surgery     Current  Outpatient Medications:    certolizumab pegol (CIMZIA) 2 X 200 MG KIT, Inject 400 mg into the skin every 30 (thirty) days., Disp: , Rfl:    HYDROcodone-acetaminophen (NORCO/VICODIN) 5-325 MG tablet, Take 1-2 tablets by mouth every 4 (four) hours as needed for severe pain ((score 7 to 10))., Disp: 40 tablet, Rfl: 0   leflunomide (ARAVA) 10 MG tablet, Take 10 mg by mouth daily., Disp: , Rfl:    Melaton-LBalm-Cham-Lav-Brom (MIDNITE SLEEP AID) CHEW, Chew 2 tablets by mouth at bedtime., Disp: , Rfl:    methylPREDNISolone (MEDROL DOSEPAK) 4 MG TBPK tablet, Take as directed, Disp: 21 tablet, Rfl: 0   rOPINIRole (REQUIP) 0.25 MG tablet, Take 0.5 mg by mouth at bedtime., Disp: , Rfl:    SF 5000 PLUS 1.1 % CREA dental cream, Place 1 application  onto teeth at bedtime., Disp: , Rfl:    docusate sodium (COLACE) 100 MG capsule, Take 1 capsule (100 mg total) by mouth 2 (two) times daily., Disp: 10 capsule, Rfl: 0   loperamide (IMODIUM A-D) 2 MG tablet, Take 2 mg by mouth 4 (four) times daily as needed for diarrhea or loose stools., Disp: , Rfl:    methocarbamol (ROBAXIN) 500  MG tablet, Take 1 tablet (500 mg total) by mouth every 6 (six) hours as needed for muscle spasms., Disp: 28 tablet, Rfl: 0   sertraline (ZOLOFT) 50 MG tablet, Take 50 mg by mouth daily., Disp: , Rfl:   Allergies  Allergen Reactions   Oxycodone-Acetaminophen Nausea And Vomiting   Penicillamine Itching and Swelling   Actemra [Tocilizumab] Rash   Codeine Rash   Penicillins Swelling and Rash    "facial swelling"   Remicade [Infliximab] Swelling and Rash   Sulfa Antibiotics Rash           Objective:  Physical Exam  General: AAO x3, NAD  Dermatological: Skin is warm, dry and supple bilateral.  There are no open sores, no preulcerative lesions, no rash or signs of infection present.  Vascular: Dorsalis Pedis artery and Posterior Tibial artery pedal pulses are 2/4 bilateral with immedate capillary fill time. There is no pain with  calf compression, swelling, warmth, erythema.   Neruologic: Grossly intact via light touch bilateral.   Musculoskeletal: There is tenderness palpation along the lateral aspect of the left foot along the sinus tarsi but also along the course the peroneal tendon.  On the right foot there is edema present to the dorsal forefoot.  No erythema or warmth.  + Extensor tendons.  Intact bilaterally.   Gait: Unassisted, Nonantalgic.       Assessment:   Capsulitis left side, arthritis     Plan:  -Treatment options discussed including all alternatives, risks, and complications -Etiology of symptoms were discussed -X-rays were obtained and reviewed with the patient.  X-rays obtained reviewed bilaterally.  Multiple views of the foot, ankle and the left side as well as the foot on the right were obtained.  There is no evidence of acute fracture or stress fracture noted bilaterally today.  Right foot there is hardware intact second toe from prior hammertoe repair.  Arthritic changes present third MTPJ.  On the left side hardware intact on subtalar joint.  On the AP view able has osteophytes, spurring present lateral aspect of the joint.  -We discussed the conservative as well as surgical options.  She is not interested in further surgery.  Hold off on advanced imaging for now.  We discussed steroid injection in the left side and she wishes to proceed.  I cleaned the skin with Betadine, alcohol.  Next a 1 cc Kenalog 10, 0.5 cc of Marcaine plain, 0.5 cc of lidocaine plain was infiltrated into and around the subtalar joint on the area where she had discomfort in the area of the spurring noted on x-ray.  Postinjection care discussed.  Tolerated well. -Given her symptoms on both sides prescribed Medrol Dosepak.  I like her to hold off for a day or 2 to see if she gets any improvement from the injection prior to starting the oral steroid.  Return in about 2 months (around 07/03/2023), or if symptoms worsen or fail  to improve.  Vivi Barrack DPM

## 2023-05-21 DIAGNOSIS — Z79899 Other long term (current) drug therapy: Secondary | ICD-10-CM | POA: Diagnosis not present

## 2023-05-21 DIAGNOSIS — M0609 Rheumatoid arthritis without rheumatoid factor, multiple sites: Secondary | ICD-10-CM | POA: Diagnosis not present

## 2023-05-21 DIAGNOSIS — R5383 Other fatigue: Secondary | ICD-10-CM | POA: Diagnosis not present

## 2023-06-18 ENCOUNTER — Ambulatory Visit (INDEPENDENT_AMBULATORY_CARE_PROVIDER_SITE_OTHER): Payer: Medicare Other | Admitting: Podiatry

## 2023-06-18 ENCOUNTER — Encounter: Payer: Self-pay | Admitting: Podiatry

## 2023-06-18 DIAGNOSIS — M7752 Other enthesopathy of left foot: Secondary | ICD-10-CM

## 2023-06-18 DIAGNOSIS — M778 Other enthesopathies, not elsewhere classified: Secondary | ICD-10-CM

## 2023-06-18 MED ORDER — MELOXICAM 15 MG PO TABS: 15.0000 mg | ORAL_TABLET | Freq: Every day | ORAL | 0 refills | Status: AC | PRN

## 2023-06-18 NOTE — Progress Notes (Signed)
Subjective:   Patient ID: Marilyn Perez, female   DOB: 73 y.o.   MRN: [id number]   HPI Chief Complaint  Patient presents with   Ankle Pain    RM#13 Left ankle pain started a few weeks ago having trouble walking. Here for injection for some relief.    73 year old female presents the office today with concerns of left foot, ankle pain and swelling around the ankle.  She does not want as well as that is hard to walk.  She does not recall any recent injuries.  No redness that she notices when it swells.  No recent treatment since I saw her last.  She also states that she been getting swelling and pain on the right foot.  No injuries.  Is also been going on for some time.  No recent treatment for this.      Objective:  Physical Exam  General: AAO x3, NAD  Dermatological: Skin is warm, dry and supple bilateral.  There are no open sores, no preulcerative lesions, no rash or signs of infection present.  Vascular: Dorsalis Pedis artery and Posterior Tibial artery pedal pulses are 2/4 bilateral with immedate capillary fill time. There is no pain with calf compression, swelling, warmth, erythema.   Neruologic: Grossly intact via light touch bilateral.   Musculoskeletal: There is tenderness palpation along the lateral aspect of the left foot along the sinus tarsi but also along the course the peroneal tendon.  She also has tenderness on the anterior lateral ankle joint.  Still in dorsal discomfort on the right foot on the forefoot.  Is no area pinpoint tenderness.  Flexor, extensor tendons intact.    Gait: Unassisted, Nonantalgic.       Assessment:   Capsulitis left side, arthritis     Plan:  -Treatment options discussed including all alternatives, risks, and complications -Etiology of symptoms were discussed -I again reviewed the x-rays with her.  Right foot pain, arthritis -She has been able to tolerate anti-inflammatories.  Slight Moxicaine.  Consider doing low-dose steroids  chronically but will defer to her rheumatologist.  She is asking for prescription of Vicodin given chronic pain and monitoring recommend appropriate PCP for this as this will likely be an ongoing issue that she has multiple joint pains.  Left ankle arthritis, capsulitis -She will proceed with a steroid injection in the left side.  Will inject in the anterolateral ankle joint today.  I cleaned the skin with Betadine, alcohol.  Next a 1 cc Kenalog 10, 0.5 cc of Marcaine plain, 0.5 cc of lidocaine plain was infiltrated into the anterolateral ankle joint.  Postinjection care discussed.  Tolerated well.  Return in about 2 months (around 08/16/2023).  Vivi Barrack DPM

## 2023-06-19 DIAGNOSIS — M0609 Rheumatoid arthritis without rheumatoid factor, multiple sites: Secondary | ICD-10-CM | POA: Diagnosis not present

## 2023-07-17 DIAGNOSIS — Z111 Encounter for screening for respiratory tuberculosis: Secondary | ICD-10-CM | POA: Diagnosis not present

## 2023-07-17 DIAGNOSIS — M0609 Rheumatoid arthritis without rheumatoid factor, multiple sites: Secondary | ICD-10-CM | POA: Diagnosis not present

## 2023-07-17 DIAGNOSIS — Z79899 Other long term (current) drug therapy: Secondary | ICD-10-CM | POA: Diagnosis not present

## 2023-07-17 DIAGNOSIS — R5383 Other fatigue: Secondary | ICD-10-CM | POA: Diagnosis not present

## 2023-08-14 DIAGNOSIS — M0609 Rheumatoid arthritis without rheumatoid factor, multiple sites: Secondary | ICD-10-CM | POA: Diagnosis not present

## 2023-09-09 DIAGNOSIS — G8929 Other chronic pain: Secondary | ICD-10-CM | POA: Diagnosis not present

## 2023-09-09 DIAGNOSIS — D638 Anemia in other chronic diseases classified elsewhere: Secondary | ICD-10-CM | POA: Diagnosis not present

## 2023-09-09 DIAGNOSIS — M069 Rheumatoid arthritis, unspecified: Secondary | ICD-10-CM | POA: Diagnosis not present

## 2023-09-09 DIAGNOSIS — E538 Deficiency of other specified B group vitamins: Secondary | ICD-10-CM | POA: Diagnosis not present

## 2023-09-09 DIAGNOSIS — R739 Hyperglycemia, unspecified: Secondary | ICD-10-CM | POA: Diagnosis not present

## 2023-09-09 DIAGNOSIS — E7800 Pure hypercholesterolemia, unspecified: Secondary | ICD-10-CM | POA: Diagnosis not present

## 2023-09-09 DIAGNOSIS — F331 Major depressive disorder, recurrent, moderate: Secondary | ICD-10-CM | POA: Diagnosis not present

## 2023-09-09 DIAGNOSIS — I10 Essential (primary) hypertension: Secondary | ICD-10-CM | POA: Diagnosis not present

## 2023-09-11 DIAGNOSIS — M25562 Pain in left knee: Secondary | ICD-10-CM | POA: Diagnosis not present

## 2023-09-11 DIAGNOSIS — M25552 Pain in left hip: Secondary | ICD-10-CM | POA: Diagnosis not present

## 2023-09-21 DIAGNOSIS — M353 Polymyalgia rheumatica: Secondary | ICD-10-CM | POA: Diagnosis not present

## 2023-09-21 DIAGNOSIS — Z79899 Other long term (current) drug therapy: Secondary | ICD-10-CM | POA: Diagnosis not present

## 2023-09-21 DIAGNOSIS — M1991 Primary osteoarthritis, unspecified site: Secondary | ICD-10-CM | POA: Diagnosis not present

## 2023-09-21 DIAGNOSIS — L409 Psoriasis, unspecified: Secondary | ICD-10-CM | POA: Diagnosis not present

## 2023-09-21 DIAGNOSIS — L4050 Arthropathic psoriasis, unspecified: Secondary | ICD-10-CM | POA: Diagnosis not present

## 2023-09-21 DIAGNOSIS — E663 Overweight: Secondary | ICD-10-CM | POA: Diagnosis not present

## 2023-09-21 DIAGNOSIS — Z6828 Body mass index (BMI) 28.0-28.9, adult: Secondary | ICD-10-CM | POA: Diagnosis not present

## 2023-09-21 DIAGNOSIS — M0609 Rheumatoid arthritis without rheumatoid factor, multiple sites: Secondary | ICD-10-CM | POA: Diagnosis not present

## 2023-09-24 DIAGNOSIS — L4050 Arthropathic psoriasis, unspecified: Secondary | ICD-10-CM | POA: Diagnosis not present

## 2023-10-22 DIAGNOSIS — M0609 Rheumatoid arthritis without rheumatoid factor, multiple sites: Secondary | ICD-10-CM | POA: Diagnosis not present

## 2023-10-28 DIAGNOSIS — M5432 Sciatica, left side: Secondary | ICD-10-CM | POA: Diagnosis not present

## 2023-10-28 DIAGNOSIS — Z6828 Body mass index (BMI) 28.0-28.9, adult: Secondary | ICD-10-CM | POA: Diagnosis not present

## 2023-10-28 DIAGNOSIS — M353 Polymyalgia rheumatica: Secondary | ICD-10-CM | POA: Diagnosis not present

## 2023-10-28 DIAGNOSIS — L409 Psoriasis, unspecified: Secondary | ICD-10-CM | POA: Diagnosis not present

## 2023-10-28 DIAGNOSIS — L4050 Arthropathic psoriasis, unspecified: Secondary | ICD-10-CM | POA: Diagnosis not present

## 2023-10-28 DIAGNOSIS — E663 Overweight: Secondary | ICD-10-CM | POA: Diagnosis not present

## 2023-10-28 DIAGNOSIS — Z79899 Other long term (current) drug therapy: Secondary | ICD-10-CM | POA: Diagnosis not present

## 2023-10-28 DIAGNOSIS — M0609 Rheumatoid arthritis without rheumatoid factor, multiple sites: Secondary | ICD-10-CM | POA: Diagnosis not present

## 2023-10-28 DIAGNOSIS — M1991 Primary osteoarthritis, unspecified site: Secondary | ICD-10-CM | POA: Diagnosis not present

## 2023-10-28 DIAGNOSIS — M25562 Pain in left knee: Secondary | ICD-10-CM | POA: Diagnosis not present

## 2023-11-16 DIAGNOSIS — M4316 Spondylolisthesis, lumbar region: Secondary | ICD-10-CM | POA: Diagnosis not present

## 2023-11-19 DIAGNOSIS — M0609 Rheumatoid arthritis without rheumatoid factor, multiple sites: Secondary | ICD-10-CM | POA: Diagnosis not present

## 2023-12-09 DIAGNOSIS — L4050 Arthropathic psoriasis, unspecified: Secondary | ICD-10-CM | POA: Diagnosis not present

## 2023-12-09 DIAGNOSIS — R0600 Dyspnea, unspecified: Secondary | ICD-10-CM | POA: Diagnosis not present

## 2023-12-09 DIAGNOSIS — I10 Essential (primary) hypertension: Secondary | ICD-10-CM | POA: Diagnosis not present

## 2023-12-09 DIAGNOSIS — R7303 Prediabetes: Secondary | ICD-10-CM | POA: Diagnosis not present

## 2023-12-09 DIAGNOSIS — E7800 Pure hypercholesterolemia, unspecified: Secondary | ICD-10-CM | POA: Diagnosis not present

## 2023-12-17 DIAGNOSIS — M0609 Rheumatoid arthritis without rheumatoid factor, multiple sites: Secondary | ICD-10-CM | POA: Diagnosis not present

## 2023-12-21 DIAGNOSIS — E663 Overweight: Secondary | ICD-10-CM | POA: Diagnosis not present

## 2023-12-21 DIAGNOSIS — M0609 Rheumatoid arthritis without rheumatoid factor, multiple sites: Secondary | ICD-10-CM | POA: Diagnosis not present

## 2023-12-21 DIAGNOSIS — L4050 Arthropathic psoriasis, unspecified: Secondary | ICD-10-CM | POA: Diagnosis not present

## 2023-12-21 DIAGNOSIS — M1991 Primary osteoarthritis, unspecified site: Secondary | ICD-10-CM | POA: Diagnosis not present

## 2023-12-21 DIAGNOSIS — M353 Polymyalgia rheumatica: Secondary | ICD-10-CM | POA: Diagnosis not present

## 2023-12-21 DIAGNOSIS — Z6828 Body mass index (BMI) 28.0-28.9, adult: Secondary | ICD-10-CM | POA: Diagnosis not present

## 2023-12-21 DIAGNOSIS — Z79899 Other long term (current) drug therapy: Secondary | ICD-10-CM | POA: Diagnosis not present

## 2023-12-21 DIAGNOSIS — L409 Psoriasis, unspecified: Secondary | ICD-10-CM | POA: Diagnosis not present

## 2024-01-14 DIAGNOSIS — M0609 Rheumatoid arthritis without rheumatoid factor, multiple sites: Secondary | ICD-10-CM | POA: Diagnosis not present

## 2024-02-11 DIAGNOSIS — M0609 Rheumatoid arthritis without rheumatoid factor, multiple sites: Secondary | ICD-10-CM | POA: Diagnosis not present

## 2024-03-08 ENCOUNTER — Ambulatory Visit: Attending: Internal Medicine | Admitting: Internal Medicine

## 2024-03-08 ENCOUNTER — Encounter: Payer: Self-pay | Admitting: Internal Medicine

## 2024-03-08 VITALS — BP 160/80 | HR 91 | Ht 65.0 in | Wt 167.2 lb

## 2024-03-08 DIAGNOSIS — R0602 Shortness of breath: Secondary | ICD-10-CM | POA: Insufficient documentation

## 2024-03-08 DIAGNOSIS — I10 Essential (primary) hypertension: Secondary | ICD-10-CM | POA: Diagnosis not present

## 2024-03-08 DIAGNOSIS — Z7689 Persons encountering health services in other specified circumstances: Secondary | ICD-10-CM | POA: Insufficient documentation

## 2024-03-08 MED ORDER — LOSARTAN POTASSIUM 50 MG PO TABS: 50.0000 mg | ORAL_TABLET | Freq: Every day | ORAL | 3 refills | Status: AC

## 2024-03-08 NOTE — Patient Instructions (Signed)
 Medication Instructions:  INCREASE losartan to 50mg  daily -- you can take two of the 25mg  tablets daily  -- new prescription sent for the 50mg  tablets  *If you need a refill on your cardiac medications before your next appointment, please call your pharmacy*   Testing/Procedures: Your physician has requested that you have an echocardiogram. Echocardiography is a painless test that uses sound waves to create images of your heart. It provides your doctor with information about the size and shape of your heart and how well your heart's chambers and valves are working. This procedure takes approximately one hour. There are no restrictions for this procedure. Please do NOT wear cologne, perfume, aftershave, or lotions (deodorant is allowed). Please arrive 15 minutes prior to your appointment time.  Please note: We ask at that you not bring children with you during ultrasound (echo/ vascular) testing. Due to room size and safety concerns, children are not allowed in the ultrasound rooms during exams. Our front office staff cannot provide observation of children in our lobby area while testing is being conducted. An adult accompanying a patient to their appointment will only be allowed in the ultrasound room at the discretion of the ultrasound technician under special circumstances. We apologize for any inconvenience.   Follow-Up: At [facility], you and your health needs are our priority.  As part of our continuing mission to provide you with exceptional heart care, our providers are all part of one team.  This team includes your primary Cardiologist (physician) and Advanced Practice Providers or APPs (Physician Assistants and Nurse Practitioners) who all work together to provide you with the care you need, when you need it.  Your next appointment:    AFTER ECHO with Dr.Hilty or PA/NP  We recommend signing up for the patient portal called MyChart.  Sign up information is provided on this  After Visit Summary.  MyChart is used to connect with patients for Virtual Visits (Telemedicine).  Patients are able to view lab/test results, encounter notes, upcoming appointments, etc.  Non-urgent messages can be sent to your provider as well.   To learn more about what you can do with MyChart, go to [email or web address].   Other Instructions

## 2024-03-08 NOTE — Progress Notes (Signed)
 OFFICE CONSULT NOTE  Chief Complaint:  Shortness of breath  Primary Care Physician: Rexanne Ingle, MD  HPI:  Marilyn Perez is a 73 y.o. female who is being seen today for the evaluation of dyspnea at the request of Rexanne Ingle, MD. This is a pleasant 73 year old female kindly referred for evaluation management of shortness of breath.  She has a history of hypertension, dyspnea, hyperlipidemia and was a 50-pack-year smoker that quit a number of years ago.  She denies any known history of COPD or lung disease and is not on any inhalers.  She does not recall having had formal pulmonary function testing.  Blood pressure was elevated today 160/80 and generally he is elevated at home.  She was reportedly allergic to ACE inhibitors which caused cough and had swelling in the past on amlodipine.  She is currently only on 25 mg losartan.  There is a history of heart failure in her mother.  She also was told that she had mitral valve prolapse based on imaging 20 to [place].  PMHx:  Past Medical History:  Diagnosis Date   Anxiety    Arthritis    ra   Depression    DOE (dyspnea on exertion)    Dysthymia    History of kidney stones    passed stones   HTN (hypertension)    Hypercholesterolemia    Hyperglycemia    Hyperlipidemia 10/21/2014   diet controlled   Iron deficiency anemia    Obesity    Osteoarthritis    Osteopenia    PONV (postoperative nausea and vomiting)    Prediabetes    Rhinitis    Thyroid  nodule    Vitamin D  deficiency     Past Surgical History:  Procedure Laterality Date   ABDOMINAL HYSTERECTOMY     partial and then total hysterectomy   ANTERIOR CERVICAL DECOMP/DISCECTOMY FUSION N/A 03/10/2014   Procedure: Cervical five-six Anterior Cervical Discectomy with  Fusion and Plating ;  Surgeon: Darina MALVA Boehringer, MD;  Location: [facility];  Service: Neurosurgery;  Laterality: N/A;   ANTERIOR CERVICAL DECOMP/DISCECTOMY FUSION N/A 02/14/2022    Procedure: Anterior Cervical Decompression and Fusion  Cervical Four-Five with removal of hardware;  Surgeon: Lanis Pupa, MD;  Location: [facility] OR;  Service: Neurosurgery;  Laterality: N/A;  3C   APPENDECTOMY     BACK SURGERY     x2- since 2021   BREAST EXCISIONAL BIOPSY     CARPAL TUNNEL RELEASE Bilateral    x 2   CESAREAN SECTION     x 3   COLONOSCOPY     EYE SURGERY Bilateral    cataracts   foot surgrey Bilateral    r/t RA - 2 left foot , 1 right foot surgery    FAMHx:  Family History  Problem Relation Age of Onset   Congestive Heart Failure Mother    COPD Mother    Heart failure Mother    Cancer Father     SOCHx:   reports that she quit smoking about 3 years ago. Her smoking use included cigarettes. She started smoking about 9 years ago. She has a 3 pack-year smoking history. She has never used smokeless tobacco. She reports that she does not drink alcohol and does not use drugs.  ALLERGIES:  Allergies  Allergen Reactions   Oxycodone -Acetaminophen  Nausea And Vomiting   Penicillamine Itching and Swelling   Actemra  [Tocilizumab ] Rash   Codeine Rash   Penicillins Swelling and  Rash    facial swelling   Remicade [Infliximab] Swelling and Rash   Sulfa Antibiotics Rash    ROS: Pertinent items noted in HPI and remainder of comprehensive ROS otherwise negative.  HOME MEDS: Current Outpatient Medications on File Prior to Visit  Medication Sig Dispense Refill   HYDROcodone -acetaminophen  (NORCO/VICODIN) 5-325 MG tablet Take 1-2 tablets by mouth every 4 (four) hours as needed for severe pain ((score 7 to 10)). 40 tablet 0   leflunomide  (ARAVA ) 10 MG tablet Take 10 mg by mouth daily.     loperamide (IMODIUM A-D) 2 MG tablet Take 2 mg by mouth 4 (four) times daily as needed for diarrhea or loose stools.     Melaton-LBalm-Cham-Lav-Brom (MIDNITE SLEEP AID) CHEW Chew 2 tablets by mouth at bedtime.     methocarbamol  (ROBAXIN ) 500 MG tablet Take 1 tablet (500 mg total) by  mouth every 6 (six) hours as needed for muscle spasms. 28 tablet 0   rOPINIRole  (REQUIP ) 0.25 MG tablet Take 0.5 mg by mouth at bedtime.     SF 5000 PLUS 1.1 % CREA dental cream Place 1 application  onto teeth at bedtime.     certolizumab pegol  (CIMZIA ) 2 X 200 MG KIT Inject 400 mg into the skin every 30 (thirty) days. (Patient not taking: Reported on 03/08/2024)     docusate sodium  (COLACE) 100 MG capsule Take 1 capsule (100 mg total) by mouth 2 (two) times daily. (Patient not taking: Reported on 03/08/2024) 10 capsule 0   meloxicam  (MOBIC ) 15 MG tablet Take 1 tablet (15 mg total) by mouth daily as needed for pain. (Patient not taking: Reported on 03/08/2024) 30 tablet 0   methylPREDNISolone  (MEDROL  DOSEPAK) 4 MG TBPK tablet Take as directed (Patient not taking: Reported on 03/08/2024) 21 tablet 0   sertraline  (ZOLOFT ) 50 MG tablet Take 50 mg by mouth daily. (Patient not taking: Reported on 03/08/2024)     No current facility-administered medications on file prior to visit.    LABS/IMAGING: No results found for this or any previous visit (from the past 48 hours). No results found.  LIPID PANEL:    Component Value Date/Time   CHOL 202 (H) 10/21/2014 0542   TRIG 89 10/21/2014 0542   HDL 52 10/21/2014 0542   CHOLHDL 3.9 10/21/2014 0542   VLDL 18 10/21/2014 0542   LDLCALC 132 (H) 10/21/2014 0542    WEIGHTS: Wt Readings from Last 3 Encounters:  03/08/24 167 lb 3.2 oz (75.8 kg)  02/14/22 166 lb (75.3 kg)  02/11/22 166 lb 6.4 oz (75.5 kg)    VITALS: BP (!) 160/80 (BP Location: Left Arm, Patient Position: Sitting, Cuff Size: Normal)   Pulse 91   Ht 5' 5 (1.651 m)   Wt 167 lb 3.2 oz (75.8 kg)   SpO2 94%   BMI 27.82 kg/m   EXAM: General appearance: alert and no distress Lungs: diminished breath sounds bilaterally Heart: regular rate and rhythm, S1, S2 normal, no murmur, click, rub or gallop Extremities: extremities normal, atraumatic, no cyanosis or edema Neurologic: Grossly  normal  EKG: EKG Interpretation Date/Time:  Tuesday March 08 2024 13:45:53 EST Ventricular Rate:  91 PR Interval:  162 QRS Duration:  74 QT Interval:  364 QTC Calculation: 447 R Axis:   48  Text Interpretation: Normal sinus rhythm Normal ECG When compared with ECG of 21-Oct-2014 05:26, No significant change was found Confirmed by Mona Kent [phone]) on 03/08/2024 1:51:49 PM  - personally reviewed  ASSESSMENT: Dyspnea on exertion 50-pack-year smoker,  quit Uncontrolled hypertension Family history of heart failure Rheumatoid arthritis  PLAN: 1.   Ms.  Aguado is referred for dyspnea on exertion.  She denies any chest pain.  Interestingly her shortness of breath is not worse when walking her dogs but rather when climbing up into bed.  She did not clearly say it was worse laying down but does note it is hard for her to breathe and she has to talk herself down to control her breathing.  She denied any anxiety.  I wonder if this may be due to some chronic lung disease given her smoking history.  No evidence of heart failure on exam but will get an echocardiogram as she did have a history of MVP in the past.  Blood pressure is also uncontrolled.  I advise increasing her losartan from 25 to 50 mg daily.  She should monitor that at home and report back blood pressure readings.  With her history of rheumatoid arthritis 1 could consider rheumatoid lung as well.  I would be reasonable to consider pulmonary function testing either through her PCP or referral to pulmonary for further evaluation.  Thanks again for the kind referral.  Vinie KYM Maxcy, MD, [facility], FNLA, FACP  [place]  [facility]  Medical Director of the [facility] Lipid Disorders &  Cardiovascular Risk Reduction Clinic Diplomate of the American Board of Clinical Lipidology Attending Cardiologist  Direct Dial: [phone]  Fax: [phone]  Website:  [email or web address] Vinie JAYSON Maxcy 03/08/2024, 9:27 PM

## 2024-03-10 DIAGNOSIS — M0609 Rheumatoid arthritis without rheumatoid factor, multiple sites: Secondary | ICD-10-CM | POA: Diagnosis not present

## 2024-03-10 DIAGNOSIS — M069 Rheumatoid arthritis, unspecified: Secondary | ICD-10-CM | POA: Diagnosis not present

## 2024-03-10 DIAGNOSIS — F331 Major depressive disorder, recurrent, moderate: Secondary | ICD-10-CM | POA: Diagnosis not present

## 2024-03-10 DIAGNOSIS — E538 Deficiency of other specified B group vitamins: Secondary | ICD-10-CM | POA: Diagnosis not present

## 2024-03-10 DIAGNOSIS — M8580 Other specified disorders of bone density and structure, unspecified site: Secondary | ICD-10-CM | POA: Diagnosis not present

## 2024-03-10 DIAGNOSIS — Z0000 Encounter for general adult medical examination without abnormal findings: Secondary | ICD-10-CM | POA: Diagnosis not present

## 2024-03-10 DIAGNOSIS — Z23 Encounter for immunization: Secondary | ICD-10-CM | POA: Diagnosis not present

## 2024-03-10 DIAGNOSIS — R0600 Dyspnea, unspecified: Secondary | ICD-10-CM | POA: Diagnosis not present

## 2024-03-10 DIAGNOSIS — I10 Essential (primary) hypertension: Secondary | ICD-10-CM | POA: Diagnosis not present

## 2024-03-10 DIAGNOSIS — Z1331 Encounter for screening for depression: Secondary | ICD-10-CM | POA: Diagnosis not present

## 2024-03-10 DIAGNOSIS — E7800 Pure hypercholesterolemia, unspecified: Secondary | ICD-10-CM | POA: Diagnosis not present

## 2024-03-10 DIAGNOSIS — R7303 Prediabetes: Secondary | ICD-10-CM | POA: Diagnosis not present

## 2024-04-04 ENCOUNTER — Ambulatory Visit (HOSPITAL_COMMUNITY)
Admission: RE | Admit: 2024-04-04 | Source: Ambulatory Visit | Attending: Internal Medicine | Admitting: Internal Medicine

## 2024-04-07 ENCOUNTER — Ambulatory Visit (HOSPITAL_COMMUNITY): Admission: RE | Admit: 2024-04-07 | Discharge: 2024-04-07 | Attending: Internal Medicine | Admitting: Internal Medicine

## 2024-04-07 DIAGNOSIS — R0602 Shortness of breath: Secondary | ICD-10-CM | POA: Insufficient documentation

## 2024-04-07 DIAGNOSIS — R0600 Dyspnea, unspecified: Secondary | ICD-10-CM | POA: Diagnosis not present

## 2024-04-08 LAB — ECHOCARDIOGRAM COMPLETE
Area-P 1/2: 3.92 cm2
S' Lateral: 2.9 cm

## 2024-04-11 DIAGNOSIS — M0609 Rheumatoid arthritis without rheumatoid factor, multiple sites: Secondary | ICD-10-CM | POA: Diagnosis not present

## 2024-04-12 ENCOUNTER — Ambulatory Visit: Payer: Self-pay | Admitting: Internal Medicine

## 2024-04-12 NOTE — Progress Notes (Unsigned)
  Cardiology Office Note   Date:  04/12/2024  ID:  Marilyn Perez, DOB 30-Mar-1951, MRN [id number] PCP: Rexanne Ingle, MD  [facility] Providers Cardiologist:  None   History of Present Illness Marilyn Perez is a 73 y.o. female with a past medical history significant for dyspnea, hypertension, hyperlipidemia, and 50-year pack smoker who quit a number of years ago here for follow-up appointment.  She was seen in consultation 11//25 for the evaluation of dyspnea per PCP.  She denies any known history of COPD or lung disease and does not use any inhalers.  She does not recall having had formal pulmonary function testing.  Blood pressure was elevated 160/80 at her last visit and generally elevated at home.  She was reportedly allergic to ACE inhibitors which caused cough and had swelling in the past on amlodipine.  Currently was on 25 mg of losartan  at her last visit.  There is a history of heart failure in her mother.  She was also told she had mitral valve prolapse based on imaging 20 to 30 [place] ago [place].  Today, she***  ROS: Pertinent ROS in HPI  Studies Reviewed     Echo 04/07/24 IMPRESSIONS     1. Mild anterior and anteroseptal hypokinesis. Left ventricular ejection  fraction, by estimation, is 55 to 60%. The left ventricle has normal  function. The left ventricle demonstrates regional wall motion  abnormalities (see scoring diagram/findings for  description). Left ventricular diastolic parameters are consistent with  Grade I diastolic dysfunction (impaired relaxation).   2. Right ventricular systolic function is normal. The right ventricular  size is normal.   3. There is no evidence of cardiac tamponade.   4. The mitral valve is normal in structure. Trivial mitral valve  regurgitation. No evidence of mitral stenosis.   5. The aortic valve is tricuspid. Aortic valve regurgitation is not  visualized. No aortic stenosis is present.   6. The inferior  vena cava is normal in size with greater than 50%  respiratory variability, suggesting right atrial pressure of 3 mmHg.  Risk Assessment/Calculations {Does this patient have ATRIAL FIBRILLATION?[phone]} No BP recorded.  {Refresh Note OR Click here to enter BP  :1}***       Physical Exam VS:  There were no vitals taken for this visit.       Wt Readings from Last 3 Encounters:  03/08/24 167 lb 3.2 oz (75.8 kg)  02/14/22 166 lb (75.3 kg)  02/11/22 166 lb 6.4 oz (75.5 kg)    GEN: Well nourished, well developed in no acute distress NECK: No JVD; No carotid bruits CARDIAC: ***RRR, no murmurs, rubs, gallops RESPIRATORY:  Clear to auscultation without rales, wheezing or rhonchi  ABDOMEN: Soft, non-tender, non-distended EXTREMITIES:  No edema; No deformity   ASSESSMENT AND PLAN DOE   HTN {Are you ordering a CV Procedure (e.g. stress test, cath, DCCV, TEE, etc)?   Press F2        :[id number]}  Dispo: ***  Signed, Orren LOISE Fabry, PA-C

## 2024-04-14 ENCOUNTER — Ambulatory Visit: Attending: Physician Assistant | Admitting: Physician Assistant

## 2024-04-14 ENCOUNTER — Encounter: Payer: Self-pay | Admitting: Physician Assistant

## 2024-04-14 VITALS — BP 124/84 | HR 90 | Ht 65.5 in | Wt 164.2 lb

## 2024-04-14 DIAGNOSIS — R0602 Shortness of breath: Secondary | ICD-10-CM | POA: Insufficient documentation

## 2024-04-14 DIAGNOSIS — R0609 Other forms of dyspnea: Secondary | ICD-10-CM | POA: Insufficient documentation

## 2024-04-14 DIAGNOSIS — E785 Hyperlipidemia, unspecified: Secondary | ICD-10-CM | POA: Diagnosis present

## 2024-04-14 DIAGNOSIS — I10 Essential (primary) hypertension: Secondary | ICD-10-CM | POA: Diagnosis present

## 2024-04-14 NOTE — Patient Instructions (Addendum)
 Thank you for choosing [facility]!     Medication Instructions:  No medication changes were made during today's visit.  *If you need a refill on your cardiac medications before your next appointment, please call your pharmacy*   Lab Work: Return for fasting labs in January 2026.......SABRA LIPID If you have labs (blood work) drawn today and your tests are completely normal, you will receive your results only by: MyChart Message (if you have MyChart) OR A paper copy in the mail If you have any lab test that is abnormal or we need to change your treatment, we will call you to review the results.   Testing/Procedures: Your physician has requested that you have a lexiscan myoview. For further information please visit [email or web address] Please follow instruction sheet, as given.   Your next appointment:   3-4 month(s)   Provider:   Vinie JAYSON Maxcy, MD     Follow-Up: At [facility], you and your health needs are our priority.  As part of our continuing mission to provide you with exceptional heart care, we have created designated Provider Care Teams.  These Care Teams include your primary Cardiologist (physician) and Advanced Practice Providers (APPs -  Physician Assistants and Nurse Practitioners) who all work together to provide you with the care you need, when you need it. We recommend signing up for the patient portal called MyChart.  Sign up information is provided on this After Visit Summary.  MyChart is used to connect with patients for Virtual Visits (Telemedicine).  Patients are able to view lab/test results, encounter notes, upcoming appointments, etc.  Non-urgent messages can be sent to your provider as well.   To learn more about what you can do with MyChart, go to [email or web address].

## 2024-04-18 ENCOUNTER — Other Ambulatory Visit: Payer: Self-pay | Admitting: Physician Assistant

## 2024-04-18 DIAGNOSIS — M0609 Rheumatoid arthritis without rheumatoid factor, multiple sites: Secondary | ICD-10-CM | POA: Diagnosis not present

## 2024-04-18 DIAGNOSIS — Z6827 Body mass index (BMI) 27.0-27.9, adult: Secondary | ICD-10-CM | POA: Diagnosis not present

## 2024-04-18 DIAGNOSIS — R0609 Other forms of dyspnea: Secondary | ICD-10-CM

## 2024-04-18 DIAGNOSIS — L4050 Arthropathic psoriasis, unspecified: Secondary | ICD-10-CM | POA: Diagnosis not present

## 2024-04-18 DIAGNOSIS — M1991 Primary osteoarthritis, unspecified site: Secondary | ICD-10-CM | POA: Diagnosis not present

## 2024-04-18 DIAGNOSIS — Z79899 Other long term (current) drug therapy: Secondary | ICD-10-CM | POA: Diagnosis not present

## 2024-04-18 DIAGNOSIS — E663 Overweight: Secondary | ICD-10-CM | POA: Diagnosis not present

## 2024-04-19 ENCOUNTER — Telehealth (HOSPITAL_COMMUNITY): Payer: Self-pay | Admitting: *Deleted

## 2024-04-19 NOTE — Telephone Encounter (Signed)
 Left detailed instructions on pt's vm for 04/21/24 stress test.

## 2024-04-21 ENCOUNTER — Ambulatory Visit (HOSPITAL_COMMUNITY)
Admission: RE | Admit: 2024-04-21 | Discharge: 2024-04-21 | Attending: Physician Assistant | Admitting: Physician Assistant

## 2024-04-21 DIAGNOSIS — R0609 Other forms of dyspnea: Secondary | ICD-10-CM | POA: Insufficient documentation

## 2024-04-21 LAB — MYOCARDIAL PERFUSION IMAGING
LV dias vol: 70 mL (ref 46–106)
LV sys vol: 22 mL (ref 3.8–5.2)
Nuc Stress EF: 69 %
Peak HR: 113 {beats}/min
Rest HR: 78 {beats}/min
Rest Nuclear Isotope Dose: 10.2 mCi
SDS: 0
SRS: 2
SSS: 0
ST Depression (mm): 0 mm
Stress Nuclear Isotope Dose: 31 mCi
TID: 1.02

## 2024-04-21 MED ORDER — TECHNETIUM TC 99M TETROFOSMIN IV KIT
31.0000 | PACK | Freq: Once | INTRAVENOUS | Status: AC | PRN
  Administered 2024-04-21: 12:00:00 31 via INTRAVENOUS

## 2024-04-21 MED ORDER — REGADENOSON 0.4 MG/5ML IV SOLN
0.4000 mg | Freq: Once | INTRAVENOUS | Status: AC
  Administered 2024-04-21: 12:00:00 0.4 mg via INTRAVENOUS

## 2024-04-21 MED ORDER — TECHNETIUM TC 99M TETROFOSMIN IV KIT
10.2000 | PACK | Freq: Once | INTRAVENOUS | Status: AC | PRN
  Administered 2024-04-21: 11:00:00 10.2 via INTRAVENOUS

## 2024-04-21 MED ORDER — REGADENOSON 0.4 MG/5ML IV SOLN
INTRAVENOUS | Status: AC
  Filled 2024-04-21: qty 5

## 2024-04-22 ENCOUNTER — Ambulatory Visit: Payer: Self-pay | Admitting: Physician Assistant

## 2024-05-06 ENCOUNTER — Encounter: Payer: Self-pay | Admitting: Physician Assistant

## 2024-07-21 ENCOUNTER — Ambulatory Visit: Admitting: Internal Medicine
